# Patient Record
Sex: Male | Born: 1945 | Race: White | Hispanic: No | Marital: Married
Health system: Midwestern US, Academic
[De-identification: ages and names within clinical notes are randomized; demographics above are authoritative.]

---

## 2016-10-09 MED ORDER — TRIAMTERENE-HYDROCHLOROTHIAZID 37.5-25 MG PO TAB
1 | ORAL_TABLET | Freq: Every morning | ORAL | 0 refills | Status: DC
Start: 2016-10-09 — End: 2017-01-04

## 2016-10-19 MED ORDER — HYDRALAZINE 25 MG PO TAB
25 mg | ORAL_TABLET | Freq: Three times a day (TID) | ORAL | 11 refills | 30.00000 days | Status: DC
Start: 2016-10-19 — End: 2016-10-23

## 2016-10-23 MED ORDER — ROSUVASTATIN 40 MG PO TAB
ORAL_TABLET | Freq: Every day | ORAL | 3 refills | 90.00000 days | Status: DC
Start: 2016-10-23 — End: 2017-11-07

## 2016-10-23 MED ORDER — HYDRALAZINE 25 MG PO TAB
25 mg | ORAL_TABLET | Freq: Three times a day (TID) | ORAL | 11 refills | 30.00000 days | Status: DC
Start: 2016-10-23 — End: 2016-10-30

## 2016-10-23 MED ORDER — CARVEDILOL 12.5 MG PO TAB
12.5 mg | ORAL_TABLET | Freq: Two times a day (BID) | ORAL | 3 refills | 90.00000 days | Status: DC
Start: 2016-10-23 — End: 2016-10-30

## 2016-10-30 MED ORDER — CARVEDILOL 12.5 MG PO TAB
12.5 mg | ORAL_TABLET | Freq: Two times a day (BID) | ORAL | 1 refills | 90.00000 days | Status: DC
Start: 2016-10-30 — End: 2017-02-08

## 2016-10-30 MED ORDER — HYDRALAZINE 25 MG PO TAB
25 mg | ORAL_TABLET | Freq: Three times a day (TID) | ORAL | 1 refills | 30.00000 days | Status: DC
Start: 2016-10-30 — End: 2017-02-08

## 2017-01-04 ENCOUNTER — Encounter: Admit: 2017-01-04 | Discharge: 2017-01-04 | Payer: MEDICARE

## 2017-01-04 MED ORDER — TRIAMTERENE-HYDROCHLOROTHIAZID 37.5-25 MG PO TAB
ORAL_TABLET | Freq: Every morning | ORAL | 3 refills | Status: AC
Start: 2017-01-04 — End: 2017-11-15

## 2017-01-29 ENCOUNTER — Encounter: Admit: 2017-01-29 | Discharge: 2017-01-29 | Payer: MEDICARE

## 2017-01-29 MED ORDER — LOSARTAN 100 MG PO TAB
100 mg | ORAL_TABLET | Freq: Every day | ORAL | 2 refills | 30.00000 days | Status: AC
Start: 2017-01-29 — End: 2017-11-07

## 2017-01-29 MED ORDER — AMLODIPINE 5 MG PO TAB
5 mg | ORAL_TABLET | Freq: Two times a day (BID) | ORAL | 2 refills | Status: AC
Start: 2017-01-29 — End: 2017-11-07

## 2017-02-08 ENCOUNTER — Encounter: Admit: 2017-02-08 | Discharge: 2017-02-08 | Payer: MEDICARE

## 2017-02-08 ENCOUNTER — Ambulatory Visit: Admit: 2017-02-08 | Discharge: 2017-02-09 | Payer: MEDICARE

## 2017-02-08 DIAGNOSIS — E039 Hypothyroidism, unspecified: ICD-10-CM

## 2017-02-08 DIAGNOSIS — I10 Essential (primary) hypertension: ICD-10-CM

## 2017-02-08 DIAGNOSIS — R001 Bradycardia, unspecified: ICD-10-CM

## 2017-02-08 DIAGNOSIS — E7800 Pure hypercholesterolemia, unspecified: ICD-10-CM

## 2017-02-08 DIAGNOSIS — Z0189 Encounter for other specified special examinations: ICD-10-CM

## 2017-02-08 DIAGNOSIS — I2510 Atherosclerotic heart disease of native coronary artery without angina pectoris: ICD-10-CM

## 2017-02-08 DIAGNOSIS — R079 Chest pain, unspecified: ICD-10-CM

## 2017-02-08 DIAGNOSIS — R9439 Abnormal result of other cardiovascular function study: ICD-10-CM

## 2017-02-08 MED ORDER — HYDRALAZINE 50 MG PO TAB
50 mg | ORAL_TABLET | Freq: Three times a day (TID) | ORAL | 3 refills | 30.00000 days | Status: AC
Start: 2017-02-08 — End: 2018-04-30

## 2017-02-08 MED ORDER — HYDRALAZINE 50 MG PO TAB
50 mg | ORAL_TABLET | Freq: Three times a day (TID) | ORAL | 11 refills | 30.00000 days | Status: AC
Start: 2017-02-08 — End: 2017-02-08

## 2017-02-08 MED ORDER — CARVEDILOL 12.5 MG PO TAB
6.25 mg | ORAL_TABLET | Freq: Two times a day (BID) | ORAL | 1 refills | 90.00000 days | Status: AC
Start: 2017-02-08 — End: 2017-11-07

## 2017-02-08 NOTE — Progress Notes
Date of Service: 02/08/2017    OSWALD POTT is a 71 y.o. male.       HPI     Mr. Venus is followed for coronary artery diseases, peripheral arterial disease, hyperlipidemia, and hypertension. When he was last seen in clinic he was placed on hydraloine 25 mg three times daily. He has been taking this twice daily because he forgets to take the afternoon dose. He has experienced no adverse effects to hydralazine.  The patient has been tracking his blood pressure recently and his blood pressure has been running approximately 130-140/70-80 mm Hg. His heart rate has been running 45-50 BPM.   Mr. Schafer reports no angina in recent months.??? He reports no congestive symptoms, palpitations, sensation of sustained forceful heart pounding, lightheadedness or syncope. His exercise tolerance has been stable. 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 The patient reports no myalgias, bleeding abnormalities, neurologic motor abnormalities or difficulty with speech. He denies any claudication. He has been tolerating the 40 mg dose of rosuvastatin without adverse effects.  He has lost 8 pounds since his last clinic visit.  ???  Historically, Mr. Harn underwent coronary artery bypass surgery in December 2005. At that time his revascularization consisted of an IMA graft to the LAD with a right internal mammary going to the obtuse marginal and a radial to the posterior descending artery. I saw Mr. Haithcock on 09/23/10 after an abnormal stress test was recently obtained. On 09/23/10 he told me that he had infrequent mild angina which he had noted over the past year. It occurred less than once a month and only when he was walking uphill and only when he was walking shortly after a meal. On 09/17/10 he walked 6 blocks in 10 minutes during a parade and had no angina because he had not eaten recently and was not walking uphill. I reviewed his stress test with him and he wanted to proceed with coronary angiography which was performed on 09/26/10. Intervention was not required. On 09/30/10 carvedilol was added to his medical regimen for BP control and he has tolerated this medication without difficulty. On 10/21/10 HCTZ 25 mg with triamterene 37.5 mg daily was also added to his medical regimen for improved BP control. ???The patient says that he had cellulitis in his left lower extremity in September 2015 that resolved with antibiotic therapy.       Vitals:    02/08/17 0843 02/08/17 0856   BP: 138/72 134/72   Pulse: (!) 39    SpO2:  95%   Weight: 98 kg (216 lb)    Height: 1.803 m (5' 11)      Body mass index is 30.13 kg/m???.     Past Medical History  Patient Active Problem List    Diagnosis Date Noted   ??? Chest pain 09/26/2010   ??? Abnormal stress test 09/26/2010   ??? Murmur 09/23/2010   ??? Coronary artery disease      A. Onset mid 2004, progression fall 2005, patient reduces activity at work to avoid symptoms  B. 12/05 Progressive angina, Dr. Duanne Limerick, then 06/29/04 CBP [facility] OV, referred for cath          >12/29 Cath 100%RCA, 75%LAD&Cx,50%LM EF 55%          >07/01/05 CABGx3 LIMA-LAD w/ Y-RIMA-OM, Rad-PDA per Dr. Ky Barban  C. 06/29/08 exercise echocardiographic images demonstrated basal septal ischemia, significant hypertensive response to  Exercise, good and adequate exercise tolerance with no angina.     ??? Hypercholesterolemia      A.  02/28/01 total 324 total 249 HDL 43 LDL 231 Pt deferred meds  B. 06/29/04 total 273 trig 193 HDL 36 LDLd206 Zocor begun dc'd by pt d/t statinphobia and cost  C.  2/24 Rx Vytorin 10/80 1/2 tab.     ??? Hypothyroidism      A.  2004 presentation with TSH over 200, Rx w l-thyroxine     ??? Hypertension      A.  2/06 Altace 5 dc'd w/ dysgeusia, Metoprolol 25 BID begun        ??? Tests ordered      A. 07/17/08 carotid artery duplex scan: no significant stenosis           Review of Systems Constitution: Positive for weight loss.   HENT: Negative.    Eyes: Negative.    Cardiovascular: Negative.    Respiratory: Negative.    Endocrine: Negative.    Hematologic/Lymphatic: Negative.    Skin: Negative.    Musculoskeletal: Positive for back pain.   Gastrointestinal: Negative.    Genitourinary: Negative.    Neurological: Positive for dizziness.   Psychiatric/Behavioral: Negative.    Allergic/Immunologic: Negative.        Physical Exam  GENERAL: The patient is well developed, well nourished, resting comfortably and in no distress. ???  HEENT: No abnormalities of the visible oro-nasopharynx, conjunctiva or sclera are noted. ???  NECK: There is no jugular venous distension. Carotids are palpable and without bruits. There is no thyroid enlargement. ???  Chest: Lung fields are clear to auscultation. There are no wheezes or crackles. ???  CV: There is a regular rhythm. The first and second heart sounds are normal. A grade 3/6 systolic murmur is heard, loudest in the right upper sternal area. There are no murmurs, gallops or rubs.  I checked his apical heart rate for 30 seconds and was 46 bpm.  ABD: The abdomen is soft and supple with normal bowel sounds. There is no hepatosplenomegaly, ascites, tenderness, masses or bruits. ???  Neuro: There are no focal motor defects. Ambulation is normal. Cognitive function appears normal. ???  Ext:???There is trace to 1+ bilateral pedal edema without evidence of deep vein thrombosis. Peripheral pulses are decreased but he has normal capillary refill and no ischemic ulcers. ???  SKIN:???There are no rashes and no cellulitis  PSYCH:???The patient is calm, rationale and oriented.    Cardiovascular Studies  Twelve-lead ECG obtained on 10/19/2016 shows sinus bradycardia with a heart rate of 50 bpm.  A nonspecific interventricular conduction delay is noted with a QRS duration of 114 ms.  Nonspecific ST-T wave abnormalities are seen.  ???  Echo Doppler 03/18/2015: 1. Septal segments have reduced contractility compared with other segments, which are dynamic. This may be attributable to prior cardiac surgery. Overall LV systolic function appears dynamic with an estimated left ventricular ejection fraction in the range of 70%.  2. There is moderate concentric left ventricular hypertrophy.  3. Normal left ventricular diastolic function.   4. Mild right ventricular enlargement with normal right ventricular contractility.  5. Mild bi-atrial enlargement.  6. Moderate aortic valve stenosis.  No pericardial effusion is seen.  ???  Labs from October 02, 2016 reveals total cholesterol 149, triglycerides 161, HDL 35 and LDL 95 mg/dL.  His ALT equals 32.  His serum creatinine = 0.81 and serum potassium is 4.1 mmol/L.    Problems Addressed Today  Coronary artery disease.  Hypertension.  Aortic valve stenosis.  Assessment and Plan     Mr. Lacewell's blood pressure appears better controlled recently.  I reviewed options for the medical management of hypertension with the patient.  He wanted to increase his hydralazine to 50 mg 3 times daily and lower his carvedilol to 6.25 mg twice a day because of his asymptomatic sinus bradycardia. I have asked the patient to keep a log book of his BP readings and to report systolic BP readings exceeding 130 mm Hg.  I have asked him to obtain an echo Doppler study to follow her progression of his aortic valve stenosis.  However, the present time he is feeling well and is asymptomatic.  I have asked him to return for follow-up in 2 months for a nursing check of his blood pressure and pulse and I  have asked him to return for a full clinic examination in 4 months time.         Current Medications (including today's revisions)  ??? amLODIPine (NORVASC) 5 mg tablet Take 1 tablet by mouth twice daily.   ??? aspirin EC 81 mg tablet Take 81 mg by mouth daily.   ??? carvedilol (COREG) 12.5 mg tablet Take 0.5 tablets by mouth twice daily. Take with food. ??? coenzyme Q10(+) 100 mg cap Take 100 mg by mouth daily.   ??? CRANBERRY FRUIT EXTRACT (CRANBERRY PO) Take 4,200 mg by mouth daily.   ??? GLUCOSAMINE SULFATE (GLUCOSAMINE PO) Take 1,000 mg by mouth daily.   ??? hydrALAZINE (APRESOLINE) 50 mg tablet Take 1 tablet by mouth three times daily.   ??? L.ACID/L.CASEI/B.BIF/B.LON/FOS (PROBIOTIC BLEND PO) Take 1 tablet by mouth daily.   ??? levothyroxine (SYNTHROID) 175 mcg tablet Take 175 mcg by mouth daily 30 minutes before breakfast.   ??? losartan(+) (COZAAR) 100 mg tablet Take 1 tablet by mouth daily.   ??? metFORMIN (GLUCOPHAGE) 500 mg tablet Take 500 mg by mouth twice daily with meals.   ??? multivitamin (MULTIPLE VITAMIN) PO per tablet Take 1 Tab by mouth Daily.   ??? nitroglycerin (NITROSTAT) 0.4 mg tablet 1 tab under your tongue as needed for chest pain may repeat in x 2   ??? omeprazole DR(+) (PRILOSEC) 20 mg PO capsule Take 10 mg by mouth. Take 1/2 tablet daily    ??? rosuvastatin (CRESTOR) 40 mg tablet TAKE 1 TABLET EVERY DAY   ??? triamterene-hydrochlorothiazide (MAXZIDE) 37.5-25 mg tablet TAKE 1 TABLET EVERY MORNING

## 2017-04-12 ENCOUNTER — Encounter: Admit: 2017-04-12 | Discharge: 2017-04-12 | Payer: MEDICARE

## 2017-04-12 NOTE — Progress Notes
Patient is here for bp check after adjustment in medications.  He denies issues.  States bp is normal for the most part but still reports some outliers.  Reported 151/100 today before coming in.  In clinic 122/68.  He states he had just come in after working on building a carport and was in a hurry.  Reviewed importance of sitting a resting 5-23min prior to getting a reading.  Reviewed diet and exercise in relation to bp.  Encouraged continued monitoring and advised to contact if trending up or symptomatic with readings.

## 2017-04-20 ENCOUNTER — Encounter: Admit: 2017-04-20 | Discharge: 2017-04-20 | Payer: MEDICARE

## 2017-05-03 ENCOUNTER — Ambulatory Visit: Admit: 2017-05-03 | Discharge: 2017-05-04 | Payer: MEDICARE

## 2017-05-03 ENCOUNTER — Encounter: Admit: 2017-05-03 | Discharge: 2017-05-03 | Payer: MEDICARE

## 2017-05-03 DIAGNOSIS — I350 Nonrheumatic aortic (valve) stenosis: Principal | ICD-10-CM

## 2017-11-07 ENCOUNTER — Encounter: Admit: 2017-11-07 | Discharge: 2017-11-07 | Payer: MEDICARE

## 2017-11-07 DIAGNOSIS — E7800 Pure hypercholesterolemia, unspecified: ICD-10-CM

## 2017-11-07 DIAGNOSIS — I159 Secondary hypertension, unspecified: ICD-10-CM

## 2017-11-07 DIAGNOSIS — I2510 Atherosclerotic heart disease of native coronary artery without angina pectoris: Principal | ICD-10-CM

## 2017-11-07 MED ORDER — LOSARTAN 100 MG PO TAB
ORAL_TABLET | Freq: Every day | ORAL | 2 refills | 90.00000 days | Status: AC
Start: 2017-11-07 — End: 2018-06-22

## 2017-11-07 MED ORDER — ROSUVASTATIN 40 MG PO TAB
ORAL_TABLET | Freq: Every day | ORAL | 3 refills | 90.00000 days | Status: AC
Start: 2017-11-07 — End: ?

## 2017-11-07 MED ORDER — CARVEDILOL 12.5 MG PO TAB
ORAL_TABLET | Freq: Two times a day (BID) | ORAL | 3 refills | 90.00000 days | Status: AC
Start: 2017-11-07 — End: 2020-02-05

## 2017-11-07 MED ORDER — AMLODIPINE 5 MG PO TAB
ORAL_TABLET | Freq: Two times a day (BID) | 2 refills | Status: AC
Start: 2017-11-07 — End: 2018-10-01

## 2017-11-15 ENCOUNTER — Encounter: Admit: 2017-11-15 | Discharge: 2017-11-15 | Payer: MEDICARE

## 2017-11-15 MED ORDER — TRIAMTERENE-HYDROCHLOROTHIAZID 37.5-25 MG PO TAB
ORAL_TABLET | Freq: Every morning | ORAL | 3 refills | Status: AC
Start: 2017-11-15 — End: 2018-08-15

## 2018-02-07 ENCOUNTER — Encounter: Admit: 2018-02-07 | Discharge: 2018-02-07 | Payer: MEDICARE

## 2018-02-07 ENCOUNTER — Ambulatory Visit: Admit: 2018-02-07 | Discharge: 2018-02-08 | Payer: MEDICARE

## 2018-02-07 DIAGNOSIS — E7800 Pure hypercholesterolemia, unspecified: Principal | ICD-10-CM

## 2018-02-07 DIAGNOSIS — I2510 Atherosclerotic heart disease of native coronary artery without angina pectoris: Principal | ICD-10-CM

## 2018-02-07 DIAGNOSIS — I10 Essential (primary) hypertension: ICD-10-CM

## 2018-02-07 DIAGNOSIS — R079 Chest pain, unspecified: ICD-10-CM

## 2018-02-07 DIAGNOSIS — E039 Hypothyroidism, unspecified: ICD-10-CM

## 2018-02-07 DIAGNOSIS — R9439 Abnormal result of other cardiovascular function study: ICD-10-CM

## 2018-02-07 DIAGNOSIS — Z0189 Encounter for other specified special examinations: ICD-10-CM

## 2018-02-07 MED ORDER — NITROGLYCERIN 0.4 MG SL SUBL
ORAL_TABLET | SUBLINGUAL | 3 refills | 9.00000 days | Status: AC | PRN
Start: 2018-02-07 — End: ?

## 2018-04-04 LAB — COMPREHENSIVE METABOLIC PANEL
Lab: 0.6
Lab: 142
Lab: 15 — ABNORMAL HIGH (ref 0–14)
Lab: 21
Lab: 27
Lab: 3.9
Lab: 75
Lab: 86

## 2018-04-04 LAB — HEMOGLOBIN A1C: Lab: 6.2

## 2018-04-04 LAB — THYROID STIMULATING HORMONE-TSH: Lab: 1.2 — ABNORMAL HIGH (ref 27.0–31.0)

## 2018-04-04 LAB — PROSTATIC SPECIFIC ANTIGEN-PSA: Lab: 0.2 — ABNORMAL HIGH (ref 30–200)

## 2018-04-04 LAB — CBC: Lab: 7.9

## 2018-04-04 LAB — LIPID PROFILE
Lab: 163
Lab: 4
Lab: 50 — ABNORMAL HIGH (ref 5–40)
Lab: 90

## 2018-04-30 ENCOUNTER — Encounter: Admit: 2018-04-30 | Discharge: 2018-04-30 | Payer: MEDICARE

## 2018-04-30 MED ORDER — HYDRALAZINE 50 MG PO TAB
50 mg | ORAL_TABLET | Freq: Three times a day (TID) | ORAL | 1 refills | 30.00000 days | Status: AC
Start: 2018-04-30 — End: 2018-08-30

## 2018-04-30 MED ORDER — HYDRALAZINE 50 MG PO TAB
ORAL_TABLET | Freq: Three times a day (TID) | ORAL | 3 refills | 42.50000 days | Status: AC
Start: 2018-04-30 — End: 2018-04-30

## 2018-05-02 ENCOUNTER — Encounter: Admit: 2018-05-02 | Discharge: 2018-05-02 | Payer: MEDICARE

## 2018-05-16 ENCOUNTER — Encounter: Admit: 2018-05-16 | Discharge: 2018-05-16 | Payer: MEDICARE

## 2018-05-23 ENCOUNTER — Encounter: Admit: 2018-05-23 | Discharge: 2018-05-23 | Payer: MEDICARE

## 2018-06-05 ENCOUNTER — Encounter: Admit: 2018-06-05 | Discharge: 2018-06-05 | Payer: MEDICARE

## 2018-06-05 LAB — COMPREHENSIVE METABOLIC PANEL
Lab: 0.6
Lab: 0.8 — ABNORMAL LOW (ref 33.0–37.0)
Lab: 110
Lab: 145
Lab: 19
Lab: 26
Lab: 6.9
Lab: 9.7

## 2018-06-05 LAB — THYROID STIMULATING HORMONE-TSH: Lab: 1.9 — ABNORMAL HIGH (ref 98–107)

## 2018-06-05 LAB — CBC: Lab: 10

## 2018-06-05 LAB — BNP (B-TYPE NATRIURETIC PEPTI): Lab: 410 — ABNORMAL HIGH (ref 0–100)

## 2018-06-06 ENCOUNTER — Encounter: Admit: 2018-06-06 | Discharge: 2018-06-06 | Payer: MEDICARE

## 2018-06-06 ENCOUNTER — Ambulatory Visit: Admit: 2018-06-06 | Discharge: 2018-06-07 | Payer: MEDICARE

## 2018-06-06 DIAGNOSIS — I10 Essential (primary) hypertension: ICD-10-CM

## 2018-06-06 DIAGNOSIS — R079 Chest pain, unspecified: ICD-10-CM

## 2018-06-06 DIAGNOSIS — Z0189 Encounter for other specified special examinations: ICD-10-CM

## 2018-06-06 DIAGNOSIS — I2510 Atherosclerotic heart disease of native coronary artery without angina pectoris: Principal | ICD-10-CM

## 2018-06-06 DIAGNOSIS — E039 Hypothyroidism, unspecified: ICD-10-CM

## 2018-06-06 DIAGNOSIS — R9439 Abnormal result of other cardiovascular function study: ICD-10-CM

## 2018-06-06 DIAGNOSIS — I350 Nonrheumatic aortic (valve) stenosis: ICD-10-CM

## 2018-06-06 DIAGNOSIS — E7800 Pure hypercholesterolemia, unspecified: ICD-10-CM

## 2018-06-06 DIAGNOSIS — R0603 Acute respiratory distress: Principal | ICD-10-CM

## 2018-06-06 MED ORDER — FUROSEMIDE 40 MG PO TAB
40 mg | ORAL_TABLET | Freq: Two times a day (BID) | ORAL | 0 refills | 90.00000 days | Status: AC
Start: 2018-06-06 — End: 2018-06-20

## 2018-06-07 ENCOUNTER — Ambulatory Visit: Admit: 2018-06-07 | Discharge: 2018-06-07 | Payer: MEDICARE

## 2018-06-07 DIAGNOSIS — R0603 Acute respiratory distress: Principal | ICD-10-CM

## 2018-06-10 ENCOUNTER — Encounter: Admit: 2018-06-10 | Discharge: 2018-06-10 | Payer: MEDICARE

## 2018-06-10 DIAGNOSIS — E7800 Pure hypercholesterolemia, unspecified: ICD-10-CM

## 2018-06-10 DIAGNOSIS — R0600 Dyspnea, unspecified: Principal | ICD-10-CM

## 2018-06-10 DIAGNOSIS — I25798 Atherosclerosis of other coronary artery bypass graft(s) with other forms of angina pectoris: Principal | ICD-10-CM

## 2018-06-10 DIAGNOSIS — I25799 Atherosclerosis of other coronary artery bypass graft(s) with unspecified angina pectoris: ICD-10-CM

## 2018-06-10 MED ORDER — NITROGLYCERIN 0.3 MG SL SUBL
.4 mg | SUBLINGUAL | 0 refills | Status: CN | PRN
Start: 2018-06-10 — End: ?

## 2018-06-10 MED ORDER — ALUMINUM-MAGNESIUM HYDROXIDE 200-200 MG/5 ML PO SUSP
30 mL | ORAL | 0 refills | Status: CN | PRN
Start: 2018-06-10 — End: ?

## 2018-06-10 MED ORDER — TEMAZEPAM 7.5 MG PO CAP
15 mg | Freq: Every evening | ORAL | 0 refills | Status: CN | PRN
Start: 2018-06-10 — End: ?

## 2018-06-10 MED ORDER — ACETAMINOPHEN 325 MG PO TAB
650 mg | ORAL | 0 refills | Status: CN | PRN
Start: 2018-06-10 — End: ?

## 2018-06-10 MED ORDER — DOCUSATE SODIUM 100 MG PO CAP
100 mg | Freq: Every day | ORAL | 0 refills | Status: CN | PRN
Start: 2018-06-10 — End: ?

## 2018-06-11 ENCOUNTER — Encounter: Admit: 2018-06-11 | Discharge: 2018-06-11 | Payer: MEDICARE

## 2018-06-13 ENCOUNTER — Encounter: Admit: 2018-06-13 | Discharge: 2018-06-13 | Payer: MEDICARE

## 2018-06-13 DIAGNOSIS — I2510 Atherosclerotic heart disease of native coronary artery without angina pectoris: ICD-10-CM

## 2018-06-13 DIAGNOSIS — R072 Precordial pain: ICD-10-CM

## 2018-06-13 DIAGNOSIS — I350 Nonrheumatic aortic (valve) stenosis: Principal | ICD-10-CM

## 2018-06-13 MED ORDER — ASPIRIN 325 MG PO TAB
325 mg | Freq: Once | ORAL | 0 refills | Status: CN
Start: 2018-06-13 — End: ?

## 2018-06-18 ENCOUNTER — Encounter: Admit: 2018-06-18 | Discharge: 2018-06-18 | Payer: MEDICARE

## 2018-06-18 ENCOUNTER — Ambulatory Visit: Admit: 2018-06-18 | Discharge: 2018-06-18 | Payer: MEDICARE

## 2018-06-18 DIAGNOSIS — R0609 Other forms of dyspnea: ICD-10-CM

## 2018-06-18 DIAGNOSIS — R9439 Abnormal result of other cardiovascular function study: ICD-10-CM

## 2018-06-18 DIAGNOSIS — I10 Essential (primary) hypertension: ICD-10-CM

## 2018-06-18 DIAGNOSIS — J449 Chronic obstructive pulmonary disease, unspecified: ICD-10-CM

## 2018-06-18 DIAGNOSIS — I350 Nonrheumatic aortic (valve) stenosis: Secondary | ICD-10-CM

## 2018-06-18 DIAGNOSIS — R079 Chest pain, unspecified: ICD-10-CM

## 2018-06-18 DIAGNOSIS — I2510 Atherosclerotic heart disease of native coronary artery without angina pectoris: Principal | ICD-10-CM

## 2018-06-18 DIAGNOSIS — E7800 Pure hypercholesterolemia, unspecified: ICD-10-CM

## 2018-06-18 DIAGNOSIS — E039 Hypothyroidism, unspecified: ICD-10-CM

## 2018-06-18 DIAGNOSIS — Z0189 Encounter for other specified special examinations: ICD-10-CM

## 2018-06-18 DIAGNOSIS — I2583 Coronary atherosclerosis due to lipid rich plaque: ICD-10-CM

## 2018-06-18 DIAGNOSIS — I25118 Atherosclerotic heart disease of native coronary artery with other forms of angina pectoris: ICD-10-CM

## 2018-06-18 LAB — O2HGB SAT-VENOUS POC
Lab: 75 % — ABNORMAL HIGH (ref 55–71)
Lab: 76 % — ABNORMAL HIGH (ref 55–71)
Lab: 96 % — ABNORMAL HIGH (ref 55–71)

## 2018-06-18 LAB — BASIC METABOLIC PANEL
Lab: 0.7 mg/dL (ref 0.4–1.24)
Lab: 131 mg/dL — ABNORMAL HIGH (ref 70–100)
Lab: 137 MMOL/L (ref 137–147)
Lab: 15 mg/dL (ref 7–25)
Lab: 3.6 MMOL/L (ref 3.5–5.1)
Lab: 30 MMOL/L (ref 21–30)
Lab: 8 (ref 3–12)
Lab: 9.1 mg/dL (ref 8.5–10.6)
Lab: >60 mL/min (ref >60)
Lab: >60 mL/min (ref >60)

## 2018-06-18 LAB — POC GLUCOSE
Lab: 124 mg/dL — ABNORMAL HIGH (ref 70–100)
Lab: 122 mg/dL — ABNORMAL HIGH (ref 70–100)

## 2018-06-18 LAB — MAGNESIUM: Lab: 1.8 mg/dL (ref 1.6–2.6)

## 2018-06-18 MED ORDER — POTASSIUM CHLORIDE 10 MEQ PO TBER
20 meq | ORAL_TABLET | Freq: Two times a day (BID) | ORAL | 3 refills | 30.00000 days | Status: AC
Start: 2018-06-18 — End: 2018-06-20

## 2018-06-18 MED ORDER — SODIUM CHLORIDE 0.9 % IV SOLP
250 mL | Freq: Once | INTRAVENOUS | 0 refills | Status: CP
Start: 2018-06-18 — End: ?
  Administered 2018-06-18: 14:00:00 250 mL via INTRAVENOUS

## 2018-06-18 MED ORDER — ACETAMINOPHEN 325 MG PO TAB
650 mg | ORAL | 0 refills | Status: DC | PRN
Start: 2018-06-18 — End: 2018-06-18

## 2018-06-18 MED ORDER — ALUMINUM-MAGNESIUM HYDROXIDE 200-200 MG/5 ML PO SUSP
30 mL | ORAL | 0 refills | Status: DC | PRN
Start: 2018-06-18 — End: 2018-06-18

## 2018-06-18 MED ORDER — ONDANSETRON HCL (PF) 4 MG/2 ML IJ SOLN
4 mg | INTRAVENOUS | 0 refills | Status: DC | PRN
Start: 2018-06-18 — End: 2018-06-18

## 2018-06-18 MED ORDER — METFORMIN 500 MG PO TAB
500 mg | ORAL_TABLET | Freq: Two times a day (BID) | ORAL | 0 refills | Status: AC
Start: 2018-06-18 — End: ?

## 2018-06-18 MED ORDER — ASPIRIN 325 MG PO TAB
325 mg | Freq: Once | ORAL | 0 refills | Status: DC
Start: 2018-06-18 — End: 2018-06-18

## 2018-06-18 MED ORDER — DIPHENHYDRAMINE HCL 25 MG PO CAP
25 mg | ORAL | 0 refills | Status: DC | PRN
Start: 2018-06-18 — End: 2018-06-18

## 2018-06-18 MED ORDER — NITROGLYCERIN 0.4 MG SL SUBL
.4 mg | SUBLINGUAL | 0 refills | Status: DC | PRN
Start: 2018-06-18 — End: 2018-06-18

## 2018-06-18 MED ORDER — DIPHENHYDRAMINE HCL 50 MG/ML IJ SOLN
25 mg | INTRAVENOUS | 0 refills | Status: DC | PRN
Start: 2018-06-18 — End: 2018-06-18

## 2018-06-18 MED ORDER — DOCUSATE SODIUM 100 MG PO CAP
100 mg | Freq: Every day | ORAL | 0 refills | Status: DC | PRN
Start: 2018-06-18 — End: 2018-06-18

## 2018-06-18 MED ORDER — MAGNESIUM SULFATE IN D5W 1 GRAM/100 ML IV PGBK
1 g | Freq: Once | INTRAVENOUS | 0 refills | Status: CP
Start: 2018-06-18 — End: ?
  Administered 2018-06-18: 19:00:00 1 g via INTRAVENOUS

## 2018-06-18 MED ORDER — TEMAZEPAM 15 MG PO CAP
15 mg | Freq: Every evening | ORAL | 0 refills | Status: DC | PRN
Start: 2018-06-18 — End: 2018-06-18

## 2018-06-18 MED ORDER — SODIUM CHLORIDE 0.9 % IV SOLP
1000 mL | Freq: Once | INTRAVENOUS | 0 refills | Status: DC
Start: 2018-06-18 — End: 2018-06-18

## 2018-06-18 MED ORDER — POTASSIUM CHLORIDE 20 MEQ PO TBTQ
40 meq | Freq: Once | ORAL | 0 refills | Status: CP
Start: 2018-06-18 — End: ?
  Administered 2018-06-18: 19:00:00 40 meq via ORAL

## 2018-06-20 ENCOUNTER — Encounter: Admit: 2018-06-20 | Discharge: 2018-06-20 | Payer: MEDICARE

## 2018-06-20 DIAGNOSIS — I350 Nonrheumatic aortic (valve) stenosis: Principal | ICD-10-CM

## 2018-06-20 DIAGNOSIS — I2510 Atherosclerotic heart disease of native coronary artery without angina pectoris: ICD-10-CM

## 2018-06-20 MED ORDER — FUROSEMIDE 40 MG PO TAB
40 mg | ORAL_TABLET | Freq: Two times a day (BID) | ORAL | 3 refills | 90.00000 days | Status: AC
Start: 2018-06-20 — End: 2018-07-02

## 2018-06-20 MED ORDER — POTASSIUM CHLORIDE 10 MEQ PO TBER
20 meq | ORAL_TABLET | Freq: Two times a day (BID) | ORAL | 3 refills | 30.00000 days | Status: AC
Start: 2018-06-20 — End: 2018-07-02

## 2018-06-21 ENCOUNTER — Encounter: Admit: 2018-06-21 | Discharge: 2018-06-21 | Payer: MEDICARE

## 2018-06-22 MED ORDER — LOSARTAN 100 MG PO TAB
ORAL_TABLET | Freq: Every day | ORAL | 3 refills | 30.00000 days | Status: AC
Start: 2018-06-22 — End: 2019-05-14

## 2018-06-25 LAB — BASIC METABOLIC PANEL
Lab: 0.8
Lab: 102
Lab: 12
Lab: 121 — ABNORMAL HIGH (ref 70–105)
Lab: 141
Lab: 19
Lab: 31
Lab: 4.3
Lab: 9.7
Lab: 94

## 2018-06-25 LAB — MAGNESIUM: Lab: 1.8

## 2018-06-28 ENCOUNTER — Encounter: Admit: 2018-06-28 | Discharge: 2018-06-28 | Payer: MEDICARE

## 2018-06-28 DIAGNOSIS — I350 Nonrheumatic aortic (valve) stenosis: Principal | ICD-10-CM

## 2018-06-28 DIAGNOSIS — I2510 Atherosclerotic heart disease of native coronary artery without angina pectoris: ICD-10-CM

## 2018-07-02 ENCOUNTER — Encounter: Admit: 2018-07-02 | Discharge: 2018-07-02 | Payer: MEDICARE

## 2018-07-02 ENCOUNTER — Ambulatory Visit: Admit: 2018-07-02 | Discharge: 2018-07-02 | Payer: MEDICARE

## 2018-07-02 DIAGNOSIS — Z0189 Encounter for other specified special examinations: ICD-10-CM

## 2018-07-02 DIAGNOSIS — E7800 Pure hypercholesterolemia, unspecified: ICD-10-CM

## 2018-07-02 DIAGNOSIS — R079 Chest pain, unspecified: ICD-10-CM

## 2018-07-02 DIAGNOSIS — I2510 Atherosclerotic heart disease of native coronary artery without angina pectoris: ICD-10-CM

## 2018-07-02 DIAGNOSIS — I10 Essential (primary) hypertension: ICD-10-CM

## 2018-07-02 DIAGNOSIS — R9439 Abnormal result of other cardiovascular function study: ICD-10-CM

## 2018-07-02 DIAGNOSIS — J449 Chronic obstructive pulmonary disease, unspecified: ICD-10-CM

## 2018-07-02 DIAGNOSIS — E039 Hypothyroidism, unspecified: ICD-10-CM

## 2018-07-02 DIAGNOSIS — I350 Nonrheumatic aortic (valve) stenosis: Principal | ICD-10-CM

## 2018-07-02 MED ORDER — POTASSIUM CHLORIDE 10 MEQ PO TBER
20 meq | ORAL_TABLET | Freq: Every day | ORAL | 3 refills | 30.00000 days | Status: AC
Start: 2018-07-02 — End: 2018-08-15

## 2018-07-02 MED ORDER — FUROSEMIDE 40 MG PO TAB
40 mg | ORAL_TABLET | Freq: Every morning | ORAL | 3 refills | 90.00000 days | Status: AC
Start: 2018-07-02 — End: 2018-08-15

## 2018-08-15 ENCOUNTER — Encounter: Admit: 2018-08-15 | Discharge: 2018-08-15 | Payer: MEDICARE

## 2018-08-15 ENCOUNTER — Ambulatory Visit: Admit: 2018-08-15 | Discharge: 2018-08-15 | Payer: MEDICARE

## 2018-08-15 DIAGNOSIS — E7800 Pure hypercholesterolemia, unspecified: ICD-10-CM

## 2018-08-15 DIAGNOSIS — E039 Hypothyroidism, unspecified: ICD-10-CM

## 2018-08-15 DIAGNOSIS — I350 Nonrheumatic aortic (valve) stenosis: Principal | ICD-10-CM

## 2018-08-15 DIAGNOSIS — I2510 Atherosclerotic heart disease of native coronary artery without angina pectoris: Principal | ICD-10-CM

## 2018-08-15 DIAGNOSIS — I10 Essential (primary) hypertension: ICD-10-CM

## 2018-08-15 DIAGNOSIS — J449 Chronic obstructive pulmonary disease, unspecified: ICD-10-CM

## 2018-08-15 DIAGNOSIS — R079 Chest pain, unspecified: ICD-10-CM

## 2018-08-15 DIAGNOSIS — R9439 Abnormal result of other cardiovascular function study: ICD-10-CM

## 2018-08-15 DIAGNOSIS — Z0189 Encounter for other specified special examinations: ICD-10-CM

## 2018-08-15 MED ORDER — POTASSIUM CHLORIDE 10 MEQ PO TBER
20 meq | ORAL_TABLET | ORAL | 3 refills | 30.00000 days | Status: AC
Start: 2018-08-15 — End: 2018-12-05

## 2018-08-15 MED ORDER — FUROSEMIDE 40 MG PO TAB
40 mg | ORAL_TABLET | ORAL | 3 refills | 90.00000 days | Status: AC
Start: 2018-08-15 — End: 2018-12-05

## 2018-08-30 ENCOUNTER — Encounter: Admit: 2018-08-30 | Discharge: 2018-08-30 | Payer: MEDICARE

## 2018-08-30 MED ORDER — HYDRALAZINE 50 MG PO TAB
50 mg | ORAL_TABLET | Freq: Three times a day (TID) | ORAL | 3 refills | 30.00000 days | Status: AC
Start: 2018-08-30 — End: 2018-09-03

## 2018-09-03 ENCOUNTER — Encounter: Admit: 2018-09-03 | Discharge: 2018-09-03 | Payer: MEDICARE

## 2018-09-03 MED ORDER — HYDRALAZINE 50 MG PO TAB
50 mg | ORAL_TABLET | Freq: Three times a day (TID) | ORAL | 3 refills | 30.00000 days | Status: AC
Start: 2018-09-03 — End: 2019-10-02

## 2018-10-01 ENCOUNTER — Encounter: Admit: 2018-10-01 | Discharge: 2018-10-01 | Payer: MEDICARE

## 2018-10-01 DIAGNOSIS — I10 Essential (primary) hypertension: Principal | ICD-10-CM

## 2018-10-01 MED ORDER — AMLODIPINE 5 MG PO TAB
5 mg | ORAL_TABLET | Freq: Two times a day (BID) | ORAL | 3 refills | Status: AC
Start: 2018-10-01 — End: 2019-07-22

## 2018-10-02 MED ORDER — AMLODIPINE 5 MG PO TAB
ORAL_TABLET | Freq: Two times a day (BID) | 2 refills | Status: AC
Start: 2018-10-02 — End: 2019-03-25

## 2018-10-31 ENCOUNTER — Encounter: Admit: 2018-10-31 | Discharge: 2018-10-31 | Payer: MEDICARE

## 2018-10-31 DIAGNOSIS — I2510 Atherosclerotic heart disease of native coronary artery without angina pectoris: Principal | ICD-10-CM

## 2018-10-31 DIAGNOSIS — Z01810 Encounter for preprocedural cardiovascular examination: ICD-10-CM

## 2018-11-12 ENCOUNTER — Encounter: Admit: 2018-11-12 | Discharge: 2018-11-12 | Payer: MEDICARE

## 2018-11-12 ENCOUNTER — Ambulatory Visit: Admit: 2018-11-12 | Discharge: 2018-11-12 | Payer: MEDICARE

## 2018-11-12 DIAGNOSIS — I2510 Atherosclerotic heart disease of native coronary artery without angina pectoris: Principal | ICD-10-CM

## 2018-11-13 ENCOUNTER — Ambulatory Visit: Admit: 2018-11-13 | Discharge: 2018-11-14 | Payer: MEDICARE

## 2018-11-13 ENCOUNTER — Encounter: Admit: 2018-11-13 | Discharge: 2018-11-13 | Payer: MEDICARE

## 2018-11-13 DIAGNOSIS — I2510 Atherosclerotic heart disease of native coronary artery without angina pectoris: Principal | ICD-10-CM

## 2018-11-13 NOTE — Telephone Encounter
-----   Message from Hester Mates, MD sent at 11/13/2018  2:24 PM CDT -----  Please send Brett Canales:[name]  Please call Mr. Zapf with the results of his regadenoson thallium stress test.  No action is required since he recently had coronary angiography.  I have placed an addendum on his clinic note from 08/15/2018 concerning his perioperative risk.  Please send this to his surgical and anesthesia staff.  Thanks.  SBG  ----- Message -----  From: Laurence Aly, MD  Sent: 11/13/2018  12:28 PM CDT  To: Hester Mates, MD

## 2018-11-21 NOTE — Telephone Encounter
Letter faxed to Dr. Mallory Shirk office [phone].

## 2018-11-29 ENCOUNTER — Encounter: Admit: 2018-11-29 | Discharge: 2018-11-29 | Payer: MEDICARE

## 2018-11-29 DIAGNOSIS — I10 Essential (primary) hypertension: Principal | ICD-10-CM

## 2018-11-29 DIAGNOSIS — I2510 Atherosclerotic heart disease of native coronary artery without angina pectoris: ICD-10-CM

## 2018-11-29 NOTE — Telephone Encounter
Patient states recent surgery.  Hx of low O2 sats. He states Dr. Andreas Newport increase diuretics and he is feeling better.  Offered OV today.  Pt states mother's funeral tomorrow.  Scheduled for OV next wk.  Will follow up with labs and call on Monday.

## 2018-12-02 ENCOUNTER — Encounter: Admit: 2018-12-02 | Discharge: 2018-12-02

## 2018-12-03 ENCOUNTER — Encounter: Admit: 2018-12-03 | Discharge: 2018-12-03

## 2018-12-03 NOTE — Telephone Encounter
-----   Message from Lyam Provencio sent at 11/29/2018  9:11 AM CDT -----  Regarding: call and follow up on pt low sats better after increase in diuretics

## 2018-12-04 ENCOUNTER — Encounter: Admit: 2018-12-04 | Discharge: 2018-12-04

## 2018-12-04 NOTE — Telephone Encounter
-----   Message from Weston Brass sent at 11/29/2018  9:11 AM CDT -----  Regarding: call and follow up on pt low sats better after increase in diuretics

## 2018-12-04 NOTE — Telephone Encounter
Pt states doing well after taking additional lasix.  O2 sat is now normal.

## 2018-12-05 ENCOUNTER — Encounter: Admit: 2018-12-05 | Discharge: 2018-12-05

## 2018-12-05 ENCOUNTER — Ambulatory Visit: Admit: 2018-12-05 | Discharge: 2018-12-06

## 2018-12-05 DIAGNOSIS — J449 Chronic obstructive pulmonary disease, unspecified: Secondary | ICD-10-CM

## 2018-12-05 DIAGNOSIS — R9439 Abnormal result of other cardiovascular function study: Secondary | ICD-10-CM

## 2018-12-05 DIAGNOSIS — I10 Essential (primary) hypertension: Secondary | ICD-10-CM

## 2018-12-05 DIAGNOSIS — Z0189 Encounter for other specified special examinations: Secondary | ICD-10-CM

## 2018-12-05 DIAGNOSIS — I350 Nonrheumatic aortic (valve) stenosis: Secondary | ICD-10-CM

## 2018-12-05 DIAGNOSIS — I2510 Atherosclerotic heart disease of native coronary artery without angina pectoris: Secondary | ICD-10-CM

## 2018-12-05 DIAGNOSIS — R079 Chest pain, unspecified: Secondary | ICD-10-CM

## 2018-12-05 DIAGNOSIS — E039 Hypothyroidism, unspecified: Secondary | ICD-10-CM

## 2018-12-05 DIAGNOSIS — I5033 Acute on chronic diastolic (congestive) heart failure: Secondary | ICD-10-CM

## 2018-12-05 DIAGNOSIS — E7800 Pure hypercholesterolemia, unspecified: Secondary | ICD-10-CM

## 2018-12-05 MED ORDER — FUROSEMIDE 40 MG PO TAB
40 mg | ORAL_TABLET | Freq: Every day | ORAL | 3 refills | 90.00000 days | Status: DC
Start: 2018-12-05 — End: 2019-06-02

## 2018-12-05 MED ORDER — POTASSIUM CHLORIDE 10 MEQ PO TBER
20 meq | ORAL_TABLET | Freq: Every day | ORAL | 3 refills | 30.00000 days | Status: DC
Start: 2018-12-05 — End: 2019-05-07

## 2018-12-05 NOTE — Progress Notes
Date of Service: 12/05/2018    Mason Lawson is a 73 y.o. male.       HPI     Mason Lawson is a pleasant 73 year old man with coronary disease status post CABG and heart failure with preserved ejection fraction here as a new patient visit in the advanced heart failure clinic and [place].  He typically follows with my partner Orlin Hilding for his general cardiology needs including coronary disease, peripheral arterial disease, hyperlipidemia, and hypertension.  He has had left shoulder and carpal tunel surgery with and symptoms much improvement in symptoms.   He is here today due to recent episode of hypoxia and decompensated heart failure.  He states everything started 3 or 4 weeks ago with a bunch of family stress, surrounding the death of his 88 year old mother.  He admittedly got stressed to the point of confusion surrounding which meds he was taking and eventually had slow decompensation in his heart failure resulting in ER visit.  He was gradually more short of breath and requiring oxygen with documented pulse ox dropping into the 80s instead of his usual which is in the 90s.  He was eventually seen in the [facility] emergency room on 11/28/2018, and on assessment it was felt his exacerbation was mild, so augmenting p.o. diuretic from every other day to every day was recommended.  He is being worked in today for heart failure visit to follow-up on his recent emergency room visit and for hypoxia.  He notes he tends to get hypoxic with being volume overloaded.  In the emergency room his potassium was also appropriately increased as well.  He states his pulse ox is greatly improved with better diuresis.      He currently wears o2 at night only, and his PCP didn't feel like he needs inhalers, but he did get his PFT done he thinks at PCP office.  He has smoked no tobacco for 15 years. He has been on diuretic since February, and he limits his salt.  Today he reports improvement in dyspnea on exertion and edema.  He currently denies exertional chest pain, irregular heartbeat/palpitations, dizzy spells, syncope/near syncope, orthopnea, and paroxysmal nocturnal dyspnea.  Home blood pressure ranges 110-130/60-70, and pulse remains around 50-60.  Weight on home scale has been down 5 pounds since his ER visit, which is back to normal.            Vitals:    12/05/18 1006 12/05/18 1015   BP: 118/66 114/64   BP Source: Arm, Left Upper Arm, Right Upper   Pulse: 49    SpO2: 96%    Weight: 96.6 kg (213 lb)    Height: 1.803 m (5' 11)    PainSc: Zero      Body mass index is 29.71 kg/m???.     Past Medical History  Patient Active Problem List    Diagnosis Date Noted   ??? Chronic heart failure with preserved ejection fraction (HCC) 12/04/2018     RIGHT HEART CATHETERIZATION:  06/2018 in the setting of severe hypertension  HEMODYNAMICS:   Blood pressure 198/81 with a mean of 130 mmHg. Heart rate 50 beats per minute.   PRESSURES:  1. The right atrial pressure was 4 mmHg. Right ventricular pressure was 64/8 mmHg.  2. The pulmonary artery pressure was 60/70 mmHg with a mean of 32 mmHg. The pulmonary capillary wedge pressure was 17 mmHg. The transpulmonary gradient was 15 mmHg.  3. The diastolic pulmonary gradient was 0 mmHg. The pulmonary  vascular resistance was 2 Wood units.   RIGHT SATURATIONS: The right atrial saturation 77%. The pulmonary artery saturation was 75%. The aortic saturation was 96%.   OUTPUTS: The cardiac output by Fick method was 5.8 L/minute with a cardiac index of 2.7 L/minute per sq m. The cardiac index by thermodilution was 7.3 with a cardiac index of 3.4 L/minute per sq m.     ??? Nonrheumatic aortic valve stenosis 06/06/2018   ??? Chest pain 09/26/2010   ??? Abnormal stress test 09/26/2010   ??? Murmur 09/23/2010   ??? Coronary artery disease      A. Onset mid 2004, progression fall 2005, patient reduces activity at work to avoid symptoms  B. 12/05 Progressive angina, Dr. Duanne Limerick, then 06/29/04 CBP [facility] OV, referred for cath          >12/29 Cath 100%RCA, 75%LAD&Cx,50%LM EF 55%          >07/01/05 CABGx3 LIMA-LAD w/ Y-RIMA-OM, Rad-PDA per Dr. Ky Barban  C. 06/29/08 exercise echocardiographic images demonstrated basal septal ischemia, significant hypertensive response to exercise, good and adequate exercise tolerance with no angina.  D. LHC 06/2018 for new heart failure symptoms: LEFT HEART CATHETERIZATION:  HEMODYNAMICS:  The aortic pressure was- 195/63 with a mean of 105 mmHg .  The left ventricular systolic pressure was 210 mmHg.  There was a peak to peak gradient of 15 mmHg.  1. Left main coronary artery:  Left main coronary artery arises normally from the left coronary sinus.  The left main coronary artery bifurcates into left anterior descending and left circumflex artery and is free of angiographically significant disease.  2. Left anterior descending:  Left anterior descending is a large vessel which gives rise to- 3 diagonal vessels. 1 diagonal has a 80% ostial stenosis, the rest of diagonal vessels do not appear to be angiographically significant. Distal LAD has competitive flow from the LIMA.   3. Left circumflex artery:  The left circumflex artery appears to be medium caliber and has a 100% stenosis proximally. The mid-to-distal portion of the vessel fills from bridging collaterals from proximal portion and is diffusely disease. Three obtuse marginal branches arise, which are also diffusely diseased.   4. Right coronary artery:  The right coronary artery is a medium caliber vessel, but has 100% chronic total occlusion after the takeoff of conus branch. The PDA fills retrogradely from the radial graft to supply the PLV and has mild plaquing in the native PDA.    BYPASS GRAFT ANATOMY:  1.The LIMA to LAD was visualized using IM catheter.  The ostium, body was noted to be free of any angiographic disease, but has a 80% stenosis at the anastamosis with diffusely diseased distal LAD. There is a Y jump graft from the LIMA to the OM branch which fills retrogradely.            2. The radial artery graft to the PDA appears to be without any angiographically significant disease at ostium, body or anastomotic site.   E. Regadenosom MPI 11/2018 TID Ratio:  1.05  (normal <1.36). Summed Stress Score:  3   , Summed Rest Score:  0 Quantitative polar maps show a statistically insignificant amount of inferoapical ischemia. Mild inferobasilar septal hypokinesis.       Left Ventricular Ejection Fraction (post stress, in the resting state) =??? 52 %.  Left Ventricular End Diastolic Volume: 563 mL  ???    SUMMARY/OPINION:?????? This study is borderline abnormal with limited reversible uptake in the inferior wall.  All segments are viable.  Global left ventricular function is within normal limits. In aggregate the current study is low risk in regards to predicted annual cardiovascular mortality rate.  ???     ??? Hypercholesterolemia      A.  02/28/01 total 324 total 249 HDL 43 LDL 231 Pt deferred meds  B. 06/29/04 total 273 trig 193 HDL 36 LDLd206 Zocor begun dc'd by pt d/t statinphobia and cost  C.  2/24 Rx Vytorin 10/80 1/2 tab.     ??? Hypothyroidism      A.  2004 presentation with TSH over 200, Rx w l-thyroxine     ??? Hypertension      A.  2/06 Altace 5 dc'd w/ dysgeusia, Metoprolol 25 BID begun        ??? Tests ordered      A. 07/17/08 carotid artery duplex scan: no significant stenosis           Review of Systems   Constitution: Negative.   HENT: Negative.    Eyes: Negative.    Cardiovascular: Negative.    Respiratory: Negative.    Endocrine: Negative.    Hematologic/Lymphatic: Negative.    Skin: Negative.    Musculoskeletal: Negative.    Gastrointestinal: Negative.    Genitourinary: Negative.    Neurological: Negative.    Psychiatric/Behavioral: Negative.    Allergic/Immunologic: Negative.        Physical Exam  Constitutional: He appears well-developed and well-nourished.   HENT:  Head: Normocephalic. Mouth/Throat: Oropharynx is clear and moist.   Eyes: Conjunctivae are normal.   Neck: Normal range of motion. No JVD present. Normal carotid pulses.  Cardiovascular: Normal rate, regular rhythm, normal heart sounds and intact distal pulses. Exam reveals no gallop and no friction rub.  No murmur heard.  Pulmonary/Chest: Effort normal and breath sounds normal. No respiratory distress. He has no wheezes. He has no rales. He exhibits no chest wall tenderness.   Abdominal: Soft. Bowel sounds are normal. He exhibits no distension. There is no tenderness.   Musculoskeletal: Normal range of motion and muscular tone. He exhibits 1+ edema with no tenderness.   Neurological: He is alert and oriented to person, place, and time. No focal deficits.  Skin: Skin is warm. No erythema.   Psychiatric: He has a normal mood and affect. Judgment, behavior, and thought content normal.       Cardiovascular Studies  Venous duplex [facility] 11/2818, no evidence of DVT in right  lower extremity  Chest x-ray at [facility] 11/28/2018 congestive heart failure with pulmonary vascular congestion, prior sternotomy, cardiomegaly, small pleural effusions.  No acute infiltrate seen.    Problems Addressed Today  Encounter Diagnoses   Name Primary?   ??? Acute on chronic heart failure with preserved ejection fraction (HCC)    ??? Nonrheumatic aortic valve stenosis    ??? Coronary artery disease due to lipid rich plaque        Assessment and Plan     Today he appears warm, euvolemic, with improved NYHA functional class III symptoms and resolved hypoxia.  He had a recent mild acute diastolic heart failure exacerbation with hypoxia that has since resolved with increasing diuretic frequency.  He is no longer volume overloaded or hypoxic.  I will order a repeat echo to reassess his aortic stenosis, diastolic function and heart failure, and we will continue daily Lasix for now with potassium supplementation.  Once he gets to his functional class II baseline we may consider spacing back out  to every other day diuretic depending on how his symptoms and weights progress.  We will track his blood pressure and weights closely and reassess labs next week.  I would like him to return to see Dr. Arna Medici as scheduled in 1 month.    Thank you for allowing me to participate in the care of this patient.  Please do not hesitate to contact me should you have any questions or concerns.    Baldo Ash, MD, PhD  Advanced Heart Failure and Transplant Cardiology  Pager [phone]         I spent 25 minutes with the patient today including approximately 15 minutes in counseling on his heart disease.  We reviewed the above treatment plan, went over risks/benefits/alternatives to the therapies, and all questions were answered to his satisfaction.      Current Medications (including today's revisions)  ??? amLODIPine (NORVASC) 5 mg tablet TAKE 1 TABLET TWICE DAILY   ??? amLODIPine (NORVASC) 5 mg tablet Take one tablet by mouth twice daily.   ??? aspirin EC 81 mg tablet Take 81 mg by mouth daily.   ??? carvedilol (COREG) 12.5 mg tablet TAKE 1 TABLET TWICE DAILY WITH FOOD (Patient taking differently: TAKE 1/2 TABLET TWICE DAILY WITH FOOD)   ??? cholecalciferol (vitamin D3) (OPTIMAL D3) 50,000 units capsule Take 1 capsule by mouth every 7 days.   ??? cinnamon bark (CINNAMON PO) Take 1 tablet by mouth daily.   ??? CRANBERRY FRUIT EXTRACT (CRANBERRY PO) Take 4,200 mg by mouth daily.   ??? furosemide (LASIX) 40 mg tablet Take one tablet by mouth every 48 hours. May take an additional lasix as needed (Patient taking differently: Take 40 mg by mouth daily. May take an additional lasix as needed)   ??? GARLIC PO Take 1 tablet by mouth daily.   ??? glucosamine/chondro su A/C/Mn (GLUCOSAMINE-CHONDROITIN COMPLX PO) Take 1 tablet by mouth daily.   ??? hydrALAZINE (APRESOLINE) 50 mg tablet Take one tablet by mouth three times daily. (Patient taking differently: Take 50 mg by mouth twice daily.)   ??? levothyroxine (SYNTHROID) 175 mcg tablet Take 175 mcg by mouth daily 30 minutes before breakfast.   ??? losartan (COZAAR) 100 mg tablet TAKE 1 TABLET EVERY DAY   ??? metFORMIN (GLUCOPHAGE) 500 mg tablet Take one tablet by mouth twice daily with meals. Ok to resume 06/21/18 (Patient taking differently: Take 500 mg by mouth twice daily with meals.)   ??? multivitamin (MULTIPLE VITAMIN) PO per tablet Take 1 Tab by mouth Daily.   ??? nitroglycerin (NITROSTAT) 0.4 mg tablet 1 tab under your tongue as needed for chest pain may repeat in x 2   ??? omeprazole DR(+) (PRILOSEC) 20 mg PO capsule Take 10 mg by mouth daily.   ??? potassium chloride (K-DUR) 10 mEq tablet Take two tablets by mouth every 48 hours. May take additional 20 meq of potassium if taking additional lasix (Patient taking differently: Take 20 mEq by mouth daily. May take additional 20 meq of potassium if taking additional lasix)   ??? rosuvastatin (CRESTOR) 40 mg tablet TAKE 1 TABLET EVERY DAY   ??? vitamins, B complex tab Take 1 tablet by mouth daily.

## 2018-12-26 ENCOUNTER — Encounter: Admit: 2018-12-26 | Discharge: 2018-12-26

## 2018-12-26 DIAGNOSIS — I350 Nonrheumatic aortic (valve) stenosis: Secondary | ICD-10-CM

## 2018-12-26 DIAGNOSIS — I5033 Acute on chronic diastolic (congestive) heart failure: Secondary | ICD-10-CM

## 2018-12-26 DIAGNOSIS — I2510 Atherosclerotic heart disease of native coronary artery without angina pectoris: Secondary | ICD-10-CM

## 2018-12-26 LAB — BASIC METABOLIC PANEL
Lab: 0.8
Lab: 105
Lab: 187 — ABNORMAL HIGH (ref 70–105)
Lab: 20
Lab: 26
Lab: 4
Lab: 9.2
Lab: 140

## 2018-12-26 LAB — MAGNESIUM: Lab: 1.6

## 2018-12-26 LAB — BNP (B-TYPE NATRIURETIC PEPTI): Lab: 83

## 2018-12-30 ENCOUNTER — Encounter: Admit: 2018-12-30 | Discharge: 2018-12-30

## 2018-12-30 DIAGNOSIS — R079 Chest pain, unspecified: Secondary | ICD-10-CM

## 2018-12-30 DIAGNOSIS — E039 Hypothyroidism, unspecified: Secondary | ICD-10-CM

## 2018-12-30 DIAGNOSIS — J449 Chronic obstructive pulmonary disease, unspecified: Secondary | ICD-10-CM

## 2018-12-30 DIAGNOSIS — R9439 Abnormal result of other cardiovascular function study: Secondary | ICD-10-CM

## 2018-12-30 DIAGNOSIS — E7800 Pure hypercholesterolemia, unspecified: Secondary | ICD-10-CM

## 2018-12-30 DIAGNOSIS — I10 Essential (primary) hypertension: Secondary | ICD-10-CM

## 2018-12-30 DIAGNOSIS — Z0189 Encounter for other specified special examinations: Secondary | ICD-10-CM

## 2018-12-30 DIAGNOSIS — I2510 Atherosclerotic heart disease of native coronary artery without angina pectoris: Secondary | ICD-10-CM

## 2019-01-07 ENCOUNTER — Encounter: Admit: 2019-01-07 | Discharge: 2019-01-07

## 2019-01-07 ENCOUNTER — Ambulatory Visit: Admit: 2019-01-07 | Discharge: 2019-01-08

## 2019-01-07 DIAGNOSIS — I10 Essential (primary) hypertension: Secondary | ICD-10-CM

## 2019-01-07 DIAGNOSIS — J449 Chronic obstructive pulmonary disease, unspecified: Secondary | ICD-10-CM

## 2019-01-07 DIAGNOSIS — I5032 Chronic diastolic (congestive) heart failure: Secondary | ICD-10-CM

## 2019-01-07 DIAGNOSIS — E7800 Pure hypercholesterolemia, unspecified: Secondary | ICD-10-CM

## 2019-01-07 DIAGNOSIS — R079 Chest pain, unspecified: Secondary | ICD-10-CM

## 2019-01-07 DIAGNOSIS — I2510 Atherosclerotic heart disease of native coronary artery without angina pectoris: Secondary | ICD-10-CM

## 2019-01-07 DIAGNOSIS — E039 Hypothyroidism, unspecified: Secondary | ICD-10-CM

## 2019-01-07 DIAGNOSIS — Z0189 Encounter for other specified special examinations: Secondary | ICD-10-CM

## 2019-01-07 DIAGNOSIS — R9439 Abnormal result of other cardiovascular function study: Secondary | ICD-10-CM

## 2019-01-07 NOTE — Progress Notes
Date of Service: 01/07/2019    Mason Lawson is a 73 y.o. male.       HPI     Mr. Mason Lawson is followed for coronary artery diseases, peripheral arterial disease, hyperlipidemia, and hypertension.???   He developed some dyspnea with exertion in late May 2020 but currently is doing well.  He has been taking furosemide 40 mg 5 days a week skipping Tuesdays and Saturdays.  He has done well on this diuretic maintenance schedule without orthopnea, edema or nocturnal dyspnea.  His exercise tolerance has been stable and he can walk several blocks at a time.  Only, the patient indicates that he has been stable and he reports no congestive symptoms, palpitations, sensation of sustained forceful heart pounding, lightheadedness or syncope. His exercise tolerance has been stable.  The patient reports no myalgias, bleeding abnormalities, neurologic motor abnormalities or difficulty with speech. He denies any???claudication. He has been tolerating the 40 mg dose of rosuvastatin without adverse effects.??? Wearing support stockings usually helps him control his edema well.  Generally his blood pressure has been under good control and his systolic blood pressures tend to run 110 to 130 mmHg.  He only takes his hydralazine twice a day because he forgets to take the afternoon dose.  Historically, Mr. Mason Lawson underwent coronary artery bypass surgery in December 2005. At that time his revascularization consisted of an IMA graft to the LAD with a right internal mammary going to the obtuse marginal and a radial to the posterior descending artery. I saw Mr. Mason Lawson on 09/23/10 after an abnormal stress test was recently obtained. On 09/23/10 he told me that he had infrequent mild angina which he had noted over the past year. It occurred less than once a month and only when he was walking uphill and only when he was walking shortly after a meal. On 09/17/10 he walked 6 blocks in 10 minutes during a parade and had no angina because he had not eaten recently and was not walking uphill. I reviewed his stress test with him and he wanted to proceed with coronary angiography which was performed on 09/26/10. Intervention was not required. On 09/30/10 carvedilol was added to his medical regimen for BP control and he has tolerated this medication without difficulty. On 10/21/10 HCTZ 25 mg with triamterene 37.5 mg daily was also added to his medical regimen for improved BP control. ???The patient says that he had cellulitis in his left lower extremity in September 2015 that resolved with antibiotic therapy. Mr. Mason Lawson developed dyspnea with exertion and underwent right and left heart catheterization/coronary angiography on 06/18/2018. Intervention was not required.       Vitals:    01/07/19 0843 01/07/19 0850   BP: (!) 140/64 (!) 140/62   BP Source: Arm, Left Upper Arm, Right Upper   Pulse: 57    SpO2: 95%    Weight: 96.4 kg (212 lb 9.6 oz)    Height: 1.803 m (5' 11)    PainSc: Zero      Body mass index is 29.65 kg/m???.     Past Medical History  Patient Active Problem List    Diagnosis Date Noted   ??? Acute on chronic heart failure with preserved ejection fraction (HCC) 12/05/2018   ??? Chronic heart failure with preserved ejection fraction (HCC) 12/04/2018     RIGHT HEART CATHETERIZATION:  06/2018 in the setting of severe hypertension  HEMODYNAMICS:   Blood pressure 198/81 with a mean of 130 mmHg. Heart rate 50 beats per  minute.   PRESSURES:  1. The right atrial pressure was 4 mmHg. Right ventricular pressure was 64/8 mmHg.  2. The pulmonary artery pressure was 60/70 mmHg with a mean of 32 mmHg. The pulmonary capillary wedge pressure was 17 mmHg. The transpulmonary gradient was 15 mmHg.  3. The diastolic pulmonary gradient was 0 mmHg. The pulmonary vascular resistance was 2 Wood units.   RIGHT SATURATIONS: The right atrial saturation 77%. The pulmonary artery saturation was 75%. The aortic saturation was 96%. OUTPUTS: The cardiac output by Fick method was 5.8 L/minute with a cardiac index of 2.7 L/minute per sq m. The cardiac index by thermodilution was 7.3 with a cardiac index of 3.4 L/minute per sq m.     ??? Nonrheumatic aortic valve stenosis 06/06/2018   ??? Chest pain 09/26/2010   ??? Abnormal stress test 09/26/2010   ??? Murmur 09/23/2010   ??? Coronary artery disease      A. Onset mid 2004, progression fall 2005, patient reduces activity at work to avoid symptoms  B. 12/05 Progressive angina, Dr. Duanne Lawson, then 06/29/04 CBP Atch OV, referred for cath          >12/29 Cath 100%RCA, 75%LAD&Cx,50%LM EF 55%          >07/01/05 CABGx3 LIMA-LAD w/ Y-RIMA-OM, Rad-PDA per Dr. Ky Lawson  C. 06/29/08 exercise echocardiographic images demonstrated basal septal ischemia, significant hypertensive response to exercise, good and adequate exercise tolerance with no angina.  D. LHC 06/2018 for new heart failure symptoms: LEFT HEART CATHETERIZATION:  HEMODYNAMICS:  The aortic pressure was- 195/63 with a mean of 105 mmHg .  The left ventricular systolic pressure was 210 mmHg.  There was a peak to peak gradient of 15 mmHg.  1. Left main coronary artery:  Left main coronary artery arises normally from the left coronary sinus.  The left main coronary artery bifurcates into left anterior descending and left circumflex artery and is free of angiographically significant disease.  2. Left anterior descending:  Left anterior descending is a large vessel which gives rise to- 3 diagonal vessels. 1 diagonal has a 80% ostial stenosis, the rest of diagonal vessels do not appear to be angiographically significant. Distal LAD has competitive flow from the LIMA.   3. Left circumflex artery:  The left circumflex artery appears to be medium caliber and has a 100% stenosis proximally. The mid-to-distal portion of the vessel fills from bridging collaterals from proximal portion and is diffusely disease. Three obtuse marginal branches arise, which are also diffusely diseased.   4. Right coronary artery:  The right coronary artery is a medium caliber vessel, but has 100% chronic total occlusion after the takeoff of conus branch. The PDA fills retrogradely from the radial graft to supply the PLV and has mild plaquing in the native PDA.    BYPASS GRAFT ANATOMY:  1.The LIMA to LAD was visualized using IM catheter.  The ostium, body was noted to be free of any angiographic disease, but has a 80% stenosis at the anastamosis with diffusely diseased distal LAD. There is a Y jump graft from the LIMA to the OM branch which fills retrogradely.            2. The radial artery graft to the PDA appears to be without any angiographically significant disease at ostium, body or anastomotic site.   E. Regadenosom MPI 11/2018 TID Ratio:  1.05  (normal <1.36). Summed Stress Score:  3   , Summed Rest Score:  0 Quantitative polar maps show  a statistically insignificant amount of inferoapical ischemia. Mild inferobasilar septal hypokinesis.       Left Ventricular Ejection Fraction (post stress, in the resting state) =??? 52 %.  Left Ventricular End Diastolic Volume: 161 mL  ???    SUMMARY/OPINION:?????? This study is borderline abnormal with limited reversible uptake in the inferior wall.  All segments are viable.  Global left ventricular function is within normal limits. In aggregate the current study is low risk in regards to predicted annual cardiovascular mortality rate.  ???     ??? Hypercholesterolemia      A.  02/28/01 total 324 total 249 HDL 43 LDL 231 Pt deferred meds  B. 06/29/04 total 273 trig 193 HDL 36 LDLd206 Zocor begun dc'd by pt d/t statinphobia and cost  C.  2/24 Rx Vytorin 10/80 1/2 tab.     ??? Hypothyroidism      A.  2004 presentation with TSH over 200, Rx w l-thyroxine     ??? Hypertension      A.  2/06 Altace 5 dc'd w/ dysgeusia, Metoprolol 25 BID begun        ??? Tests ordered      A. 07/17/08 carotid artery duplex scan: no significant stenosis Review of Systems   Constitution: Negative.   HENT: Negative.    Eyes: Negative.    Cardiovascular: Positive for dyspnea on exertion.   Respiratory: Negative.    Endocrine: Negative.    Hematologic/Lymphatic: Negative.    Skin: Negative.    Musculoskeletal: Positive for neck pain.   Gastrointestinal: Negative.    Genitourinary: Negative.    Neurological: Negative.    Psychiatric/Behavioral: Negative.    Allergic/Immunologic: Negative.        Physical Exam  GENERAL: The patient is well developed, well nourished, resting comfortably and in no distress. ???  HEENT: No abnormalities of the visible oro-nasopharynx, conjunctiva or sclera are noted. ???  NECK: There is no jugular venous distension. Carotids are palpable and without bruits. There is no thyroid enlargement. ???  Chest: Lung fields are clear to auscultation. There are no wheezes or crackles. ???  CV: There is a regular rhythm. The first and second heart sounds are normal. A grade 3/6 systolic murmur is heard, loudest in the right upper sternal area. There are no murmurs, gallops or rubs.??????I checked his apical heart rate and it was???56 bpm.  ABD: The abdomen is soft and supple with normal bowel sounds. There is no hepatosplenomegaly, ascites, tenderness, masses or bruits. ???  Neuro: There are no focal motor defects. Ambulation is normal. Cognitive function appears normal. ???  Ext:???There is no edema or evidence of deep vein thrombosis.??????Peripheral pulses are decreased but he has normal capillary refill and no ischemic ulcers. ???  SKIN:???Eczema type rash near left shoulder.  Limited rash near left front belt line.  PSYCH:???The patient is calm, rationale and oriented.    Cardiovascular Studies  A twelve-lead ECG obtained on June 06, 2018 reveals normal sinus rhythm with a heart rate of 57 bpm. ???Multiple premature atrial beats are seen. ???Incomplete right bundle branch block is noted. ???The QRS durations 114 ms. Labs from 12/26/2018 revealed serum creatinine 0.81 mg/dL and serum potassium 4.0 mmol/L.  His BNP was 83 (down from 305 on 11/23/2018).  Echo Doppler 06/07/2018:  ??? Left Ventricle: Normal size. Concentric remodeling. Normal ejection fraction with LVEF=70%. The base of the inferior septum/inferior wall are akinetic.  ??? Right Ventricle: Normal size and ejection fraction.  ??? The aortic valve is tricuspid.  All leaflets are thickened/calcified with reduced excursion. Mild-moderate stenosis.(MG=6mmHg) No regurgitation.  ??? Compared with prior study dated 05/03/17, no significant change is noted.  ???  Right and left heart catheterization/coronary angiography 06/18/2018:  RIGHT HEART CATHETERIZATION:??????  HEMODYNAMICS: ???  1. Blood pressure 198/81 with a mean of 130 mmHg.  2. Heart rate 50 beats per minute.   PRESSURES:  1. The right atrial pressure was 4 mmHg.  2. Right ventricular pressure was 64/8 mmHg.  3. The pulmonary artery pressure was 60/70 mmHg with a mean of 32 mmHg.  4. The pulmonary capillary wedge pressure was 17 mmHg.  5. The transpulmonary gradient was 15 mmHg.  6. The diastolic pulmonary gradient was 0 mmHg.  7. The pulmonary vascular resistance was 2 Wood units.   ???  RIGHT SATURATIONS:  1. The right atrial saturation 77%.  2. The pulmonary artery saturation was 75%.  3. The aortic saturation was 96%.   ???  OUTPUTS:  1. The cardiac output by Fick method was 5.8 L/minute with a cardiac index of 2.7 L/minute per sq m.  2. The cardiac index by thermodilution was 7.3 with a cardiac index of 3.4 L/minute per sq m.  ???  LEFT HEART CATHETERIZATION:??????HEMODYNAMICS: ???The aortic pressure was- 195/63 with a mean of???105 mmHg . ???The left ventricular systolic pressure was 210???mmHg. ???There was a peak to peak gradient of 15 mmHg.  ???  SELECTIVE CORONARY ANGIOGRAPHY:??????  1. Left main coronary artery: ???Left main coronary artery arises normally from the left coronary sinus. ???The left main coronary artery bifurcates into left anterior descending and left circumflex artery and is free of angiographically significant disease.  2. Left anterior descending: ???Left anterior descending is a large vessel which gives rise to- 3 diagonal vessels.???1 diagonal has a 80% ostial stenosis, the rest of diagonal vessels do not appear to be angiographically significant.???Distal LAD has competitive flow from the LIMA.   3. Left circumflex artery: ???The left circumflex artery appears to be medium caliber and has a 100% stenosis proximally.???The mid-to-distal portion of the vessel fills from bridging collaterals from proximal portion and is diffusely disease. Three obtuse marginal branches arise, which are also diffusely diseased.   4. Right coronary artery: ???The right coronary artery is a medium caliber vessel, but has 100% chronic total occlusion after the takeoff of conus branch.???The PDA fills retrogradely from the radial graft to supply the PLV and has mild plaquing in the native PDA. ???  ???  BYPASS GRAFT ANATOMY:??????  1. The LIMA to LAD was visualized using IM catheter. ???The ostium, body was noted to be free of any angiographic disease, but has a 80% stenosis at the anastamosis with diffusely diseased distal LAD. There is a Y jump graft from the LIMA to the OM branch which fills retrogradely.   2. The radial artery graft to the PDA???appears to be without any angiographically significant disease???at ostium, body or anastomotic site.???  ??????  IMPRESSION:??????  1. Three-vessel coronary artery disease with unchanged bypass graft anatomy???compared to prior cardiac catheterization in 2012.  2. Normal left-sided filling pressures.  3. Normal cardiac output.    Problems Addressed Today  Chronic diastolic heart failure.  Hypertension.  Coronary artery disease.  Hypercholesterolemia.  Aortic valve stenosis  Assessment and Plan     Mr. Dinunzio's chronic diastolic heart failure currently appears very well compensated.  He can continue on his current diuretic regimen realizing that he can take additional furosemide 40 mg on Tuesdays or Saturdays for any fluid  retention.  His weight has been stable over the past month.  I reminded him to take his afternoon dose of hydralazine for blood pressure control.  Regular mild aerobic exercise, weight loss and adherence to a heart healthy diet were recommended.  I have asked him to return for follow-up in approximately 2 months time.         Current Medications (including today's revisions)  ??? amLODIPine (NORVASC) 5 mg tablet TAKE 1 TABLET TWICE DAILY   ??? amLODIPine (NORVASC) 5 mg tablet Take one tablet by mouth twice daily.   ??? aspirin EC 81 mg tablet Take 81 mg by mouth daily.   ??? carvedilol (COREG) 12.5 mg tablet TAKE 1 TABLET TWICE DAILY WITH FOOD (Patient taking differently: TAKE 1/2 TABLET TWICE DAILY WITH FOOD)   ??? cholecalciferol (vitamin D3) (OPTIMAL D3) 50,000 units capsule Take 1 capsule by mouth every 7 days.   ??? cinnamon bark (CINNAMON PO) Take 1 tablet by mouth daily.   ??? CRANBERRY FRUIT EXTRACT (CRANBERRY PO) Take 4,200 mg by mouth daily.   ??? furosemide (LASIX) 40 mg tablet Take one tablet by mouth daily. May take an additional lasix as needed   ??? GARLIC PO Take 1 tablet by mouth daily.   ??? glucosamine/chondro su A/C/Mn (GLUCOSAMINE-CHONDROITIN COMPLX PO) Take 1 tablet by mouth daily.   ??? hydrALAZINE (APRESOLINE) 50 mg tablet Take one tablet by mouth three times daily. (Patient taking differently: Take 50 mg by mouth twice daily.)   ??? levothyroxine (SYNTHROID) 175 mcg tablet Take 175 mcg by mouth daily 30 minutes before breakfast.   ??? losartan (COZAAR) 100 mg tablet TAKE 1 TABLET EVERY DAY   ??? metFORMIN (GLUCOPHAGE) 500 mg tablet Take one tablet by mouth twice daily with meals. Ok to resume 06/21/18 (Patient taking differently: Take 500 mg by mouth twice daily with meals.)   ??? multivitamin (MULTIPLE VITAMIN) PO per tablet Take 1 Tab by mouth Daily.   ??? nitroglycerin (NITROSTAT) 0.4 mg tablet 1 tab under your tongue as needed for chest pain may repeat in x 2   ??? omeprazole DR(+) (PRILOSEC) 20 mg PO capsule Take 10 mg by mouth daily.   ??? potassium chloride (KLOR-CON) 10 mEq tablet Take two tablets by mouth daily. May take additional 20 meq of potassium if taking additional lasix   ??? rosuvastatin (CRESTOR) 40 mg tablet TAKE 1 TABLET EVERY DAY   ??? vitamins, B complex tab Take 1 tablet by mouth daily.

## 2019-03-25 ENCOUNTER — Encounter: Admit: 2019-03-25 | Discharge: 2019-03-25 | Payer: MEDICARE

## 2019-03-25 ENCOUNTER — Ambulatory Visit: Admit: 2019-03-25 | Discharge: 2019-03-26 | Payer: MEDICARE

## 2019-03-25 NOTE — Progress Notes
Date of Service: 03/25/2019    Mason Lawson is a 73 y.o. male.       HPI     Mr. Mason Lawson is followed for coronary artery diseases, peripheral arterial disease, hyperlipidemia, and hypertension.??   He has done extremely well on his current diuretic dosage and is not required any supplemental furosemide. He has been taking furosemide 40 mg 5 days a week skipping Tuesdays and Saturdays.  He has done well on this diuretic maintenance schedule without orthopnea, edema or nocturnal dyspnea.  His exercise tolerance has improved and he can walk extended distances without difficulty.    Recently,?the patient indicates that he has been stable and?he reports no congestive symptoms, palpitations, sensation of sustained forceful heart pounding, lightheadedness or syncope.?The patient reports no myalgias, bleeding abnormalities, neurologic motor abnormalities or difficulty with speech. He denies any?claudication. He has been tolerating the 40 mg dose of rosuvastatin without adverse effects.??Wearing support stockings usually helps him control his edema well.  Generally his blood pressure has been under good control and his systolic blood pressures tend to run 110 to 130 mmHg.      Historically, Mr. Mason Lawson underwent coronary artery bypass surgery in December 2005. At that time his revascularization consisted of an IMA graft to the LAD with a right internal mammary going to the obtuse marginal and a radial to the posterior descending artery. I saw [name] on 09/23/10 after an abnormal stress test was recently obtained. On 09/23/10 he told me that he had infrequent mild angina which he had noted over the past year. It occurred less than once a month and only when he was walking uphill and only when he was walking shortly after a meal. On 09/17/10 he walked 6 blocks in 10 minutes during a parade and had no angina because he had not eaten recently and was not walking uphill. I reviewed his stress test with him and he wanted to proceed with coronary angiography which was performed on 09/26/10. Intervention was not required. On 09/30/10 carvedilol was added to his medical regimen for BP control and he has tolerated this medication without difficulty. On 10/21/10 HCTZ 25 mg with triamterene 37.5 mg daily was also added to his medical regimen for improved BP control. ?The patient says that he had cellulitis in his left lower extremity in September 2015 that resolved with antibiotic therapy. Mr. Mason Lawson?developed dyspnea with exertion and?underwent right and left heart catheterization/coronary angiography on 06/18/2018.?Intervention was not required.         Vitals:    03/25/19 0846 03/25/19 0853   BP: 122/62 130/66   BP Source: Arm, Left Upper Arm, Right Upper   Pulse: 49    Temp: 36.5 ?C (97.7 ?F)    SpO2: 95%    Weight: 99.8 kg (220 lb)    Height: 1.803 m (5' 11)    PainSc: Zero      Body mass index is 30.68 kg/m?[name]     Past Medical History  Patient Active Problem List    Diagnosis Date Noted   ? Acute on chronic heart failure with preserved ejection fraction (HCC) 12/05/2018   ? Chronic heart failure with preserved ejection fraction (HCC) 12/04/2018     RIGHT HEART CATHETERIZATION:  06/2018 in the setting of severe hypertension  HEMODYNAMICS:   Blood pressure 198/81 with a mean of 130 mmHg. Heart rate 50 beats per minute.   PRESSURES:  1. The right atrial pressure was 4 mmHg. Right ventricular pressure was 64/8 mmHg.  2. The pulmonary artery pressure was 60/70 mmHg with a mean of 32 mmHg. The pulmonary capillary wedge pressure was 17 mmHg. The transpulmonary gradient was 15 mmHg.  3. The diastolic pulmonary gradient was 0 mmHg. The pulmonary vascular resistance was 2 Wood units.   RIGHT SATURATIONS: The right atrial saturation 77%. The pulmonary artery saturation was 75%. The aortic saturation was 96%.   OUTPUTS: The cardiac output by Fick method was 5.8 L/minute with a cardiac index of 2.7 L/minute per sq m. The cardiac index by thermodilution was 7.3 with a cardiac index of 3.4 L/minute per sq m.     ? Nonrheumatic aortic valve stenosis 06/06/2018   ? Chest pain 09/26/2010   ? Abnormal stress test 09/26/2010   ? Murmur 09/23/2010   ? Coronary artery disease      A. Onset mid 2004, progression fall 2005, patient reduces activity at work to avoid symptoms  B. 12/05 Progressive angina, Dr. Duanne Limerick, then 06/29/04 CBP Atch OV, referred for cath          >12/29 Cath 100%RCA, 75%LAD&Cx,50%LM EF 55%          >07/01/05 CABGx3 LIMA-LAD w/ Y-RIMA-OM, Rad-PDA per Dr. Ky Barban  C. 06/29/08 exercise echocardiographic images demonstrated basal septal ischemia, significant hypertensive response to exercise, good and adequate exercise tolerance with no angina.  D. LHC 06/2018 for new heart failure symptoms: LEFT HEART CATHETERIZATION:  HEMODYNAMICS:  The aortic pressure was- 195/63 with a mean of 105 mmHg .  The left ventricular systolic pressure was 210 mmHg.  There was a peak to peak gradient of 15 mmHg.  1. Left main coronary artery:  Left main coronary artery arises normally from the left coronary sinus.  The left main coronary artery bifurcates into left anterior descending and left circumflex artery and is free of angiographically significant disease.  2. Left anterior descending:  Left anterior descending is a large vessel which gives rise to- 3 diagonal vessels. 1 diagonal has a 80% ostial stenosis, the rest of diagonal vessels do not appear to be angiographically significant. Distal LAD has competitive flow from the LIMA.   3. Left circumflex artery:  The left circumflex artery appears to be medium caliber and has a 100% stenosis proximally. The mid-to-distal portion of the vessel fills from bridging collaterals from proximal portion and is diffusely disease. Three obtuse marginal branches arise, which are also diffusely diseased. 4. Right coronary artery:  The right coronary artery is a medium caliber vessel, but has 100% chronic total occlusion after the takeoff of conus branch. The PDA fills retrogradely from the radial graft to supply the PLV and has mild plaquing in the native PDA.    BYPASS GRAFT ANATOMY:  1.The LIMA to LAD was visualized using IM catheter.  The ostium, body was noted to be free of any angiographic disease, but has a 80% stenosis at the anastamosis with diffusely diseased distal LAD. There is a Y jump graft from the LIMA to the OM branch which fills retrogradely.            2. The radial artery graft to the PDA appears to be without any angiographically significant disease at ostium, body or anastomotic site.   E. Regadenosom MPI 11/2018 TID Ratio:  1.05  (normal <1.36). Summed Stress Score:  3   , Summed Rest Score:  0 Quantitative polar maps show a statistically insignificant amount of inferoapical ischemia. Mild inferobasilar septal hypokinesis.       Left Ventricular Ejection  Fraction (post stress, in the resting state) =? 52 %.  Left Ventricular End Diastolic Volume: 161 mL  ?    SUMMARY/OPINION:?? This study is borderline abnormal with limited reversible uptake in the inferior wall.  All segments are viable.  Global left ventricular function is within normal limits. In aggregate the current study is low risk in regards to predicted annual cardiovascular mortality rate.  ?     ? Hypercholesterolemia      A.  02/28/01 total 324 total 249 HDL 43 LDL 231 Pt deferred meds  B. 06/29/04 total 273 trig 193 HDL 36 LDLd206 Zocor begun dc'd by pt d/t statinphobia and cost  C.  2/24 Rx Vytorin 10/80 1/2 tab.     ? Hypothyroidism      A.  2004 presentation with TSH over 200, Rx w l-thyroxine     ? Hypertension      A.  2/06 Altace 5 dc'd w/ dysgeusia, Metoprolol 25 BID begun        ? Tests ordered      A. 07/17/08 carotid artery duplex scan: no significant stenosis           Review of Systems   Constitution: Negative. HENT: Negative.    Eyes: Negative.    Cardiovascular: Positive for leg swelling.   Respiratory: Positive for sleep disturbances due to breathing.    Endocrine: Negative.    Hematologic/Lymphatic: Negative.    Skin: Negative.    Musculoskeletal: Positive for arthritis, back pain, joint pain, neck pain and stiffness.   Gastrointestinal: Negative.    Genitourinary: Negative.    Neurological: Negative.    Psychiatric/Behavioral: Negative.    Allergic/Immunologic: Negative.        Physical Exam  GENERAL: The patient is well developed, well nourished, resting comfortably and in no distress. ?  HEENT: No abnormalities of the visible oro-nasopharynx, conjunctiva or sclera are noted. ?  NECK: There is no jugular venous distension. Carotids are palpable and without bruits. There is no thyroid enlargement. ?  Chest: Lung fields are clear to auscultation. There are no wheezes or crackles. ?  CV: There is a regular rhythm. The first and second heart sounds are normal. A grade 3/6 systolic murmur is heard, loudest in the right upper sternal area. There are no murmurs, gallops or rubs.??I checked his apical heart rate and it was?56?bpm.  ABD: The abdomen is soft and supple with normal bowel sounds. There is no hepatosplenomegaly, ascites, tenderness, masses or bruits. ?  Neuro: There are no focal motor defects. Ambulation is normal. Cognitive function appears normal. ?  Ext:?There is no edema or evidence of deep vein thrombosis.??Peripheral pulses are decreased but he has normal capillary refill and no ischemic ulcers. ?  SKIN:?Eczema type rash near left shoulder. ?Limited rash near left front belt line.  PSYCH:?The patient is calm, rationale and oriented.    Cardiovascular Studies  A twelve-lead ECG obtained on June 06, 2018 reveals normal sinus rhythm with a heart rate of 57 bpm. ?Multiple premature atrial beats are seen. ?Incomplete right bundle branch block is noted. ?The QRS durations 114 ms.  ? Labs from 12/26/2018 revealed serum creatinine 0.81 mg/dL and serum potassium 4.0 mmol/L.  His BNP was 83 (down from 305 on 11/23/2018).  Echo Doppler 06/07/2018:  ? Left Ventricle: Normal size. Concentric remodeling. Normal ejection fraction with LVEF=70%. The base of the inferior septum/inferior wall are akinetic.  ? Right Ventricle: Normal size and ejection fraction.  ? The aortic valve is tricuspid.  All leaflets are thickened/calcified with reduced excursion. Mild-moderate stenosis.(MG=50mmHg) No regurgitation.  ? Compared with prior study dated 05/03/17, no significant change is noted.  ?  Right and left heart catheterization/coronary angiography 06/18/2018:  RIGHT HEART CATHETERIZATION:??  HEMODYNAMICS: ?  4. Blood pressure 198/81 with a mean of 130 mmHg.  5. Heart rate 50 beats per minute.   PRESSURES:  1. The right atrial pressure was 4 mmHg.  2. Right ventricular pressure was 64/8 mmHg.  3. The pulmonary artery pressure was 60/70 mmHg with a mean of 32 mmHg.  4. The pulmonary capillary wedge pressure was 17 mmHg.  5. The transpulmonary gradient was 15 mmHg.  6. The diastolic pulmonary gradient was 0 mmHg.  7. The pulmonary vascular resistance was 2 Wood units.   ?  RIGHT SATURATIONS:  1. The right atrial saturation 77%.  2. The pulmonary artery saturation was 75%.  3. The aortic saturation was 96%.   ?  OUTPUTS:  1. The cardiac output by Fick method was 5.8 L/minute with a cardiac index of 2.7 L/minute per sq m.  2. The cardiac index by thermodilution was 7.3 with a cardiac index of 3.4 L/minute per sq m.  ?  LEFT HEART CATHETERIZATION:??HEMODYNAMICS: ?The aortic pressure was- 195/63 with a mean of?105 mmHg . ?The left ventricular systolic pressure was 210?mmHg. ?There was a peak to peak gradient of 15 mmHg.  ?  SELECTIVE CORONARY ANGIOGRAPHY:??  1. Left main coronary artery: ?Left main coronary artery arises normally from the left coronary sinus. ?The left main coronary artery bifurcates into left anterior descending and left circumflex artery and is free of angiographically significant disease.  2. Left anterior descending: ?Left anterior descending is a large vessel which gives rise to- 3 diagonal vessels.?1 diagonal has a 80% ostial stenosis, the rest of diagonal vessels do not appear to be angiographically significant.?Distal LAD has competitive flow from the LIMA.   3. Left circumflex artery: ?The left circumflex artery appears to be medium caliber and has a 100% stenosis proximally.?The mid-to-distal portion of the vessel fills from bridging collaterals from proximal portion and is diffusely disease. Three obtuse marginal branches arise, which are also diffusely diseased.   4. Right coronary artery: ?The right coronary artery is a medium caliber vessel, but has 100% chronic total occlusion after the takeoff of conus branch.?The PDA fills retrogradely from the radial graft to supply the PLV and has mild plaquing in the native PDA. ?  ?  BYPASS GRAFT ANATOMY:??  1. The LIMA to LAD was visualized using IM catheter. ?The ostium, body was noted to be free of any angiographic disease, but has a 80% stenosis at the anastamosis with diffusely diseased distal LAD. There is a Y jump graft from the LIMA to the OM branch which fills retrogradely.   2. The radial artery graft to the PDA?appears to be without any angiographically significant disease?at ostium, body or anastomotic site.?  ??  IMPRESSION:??  1. Three-vessel coronary artery disease with unchanged bypass graft anatomy?compared to prior cardiac catheterization in 2012.  2. Normal left-sided filling pressures.  3. Normal cardiac output  4.   Problems addressed today:  Chronic diastolic heart failure.  Hypertension.  Coronary artery disease.  Hypercholesterolemia.  Aortic valve stenosis  Assessment and Plan  Mr. Prouse's chronic diastolic heart failure currently appears very well compensated.  He can continue on his current diuretic regimen realizing that he can take additional furosemide 40 mg on Tuesdays or Saturdays for any fluid retention.  I reminded him to take his afternoon dose of hydralazine for blood pressure control.  Regular mild aerobic exercise, weight loss and adherence to a heart healthy diet were recommended.  I have asked him to return for follow-up in approximately 4 months time.              Current Medications (including today's revisions)  ? amLODIPine (NORVASC) 5 mg tablet Take one tablet by mouth twice daily.   ? aspirin EC 81 mg tablet Take 81 mg by mouth daily.   ? carvedilol (COREG) 12.5 mg tablet TAKE 1 TABLET TWICE DAILY WITH FOOD (Patient taking differently: TAKE 1/2 TABLET TWICE DAILY WITH FOOD)   ? cholecalciferol (VITAMIN D-3) 1,000 units tablet Take 1,000 Units by mouth daily.   ? CRANBERRY FRUIT EXTRACT (CRANBERRY PO) Take 4,200 mg by mouth daily.   ? furosemide (LASIX) 40 mg tablet Take one tablet by mouth daily. May take an additional lasix as needed (Patient taking differently: Take 40 mg by mouth five times weekly. May take an additional lasix as needed)   ? GARLIC PO Take 1 tablet by mouth daily.   ? glucosamine/chondro su A/C/Mn (GLUCOSAMINE-CHONDROITIN COMPLX PO) Take 1 tablet by mouth daily.   ? hydrALAZINE (APRESOLINE) 50 mg tablet Take one tablet by mouth three times daily. (Patient taking differently: Take 50 mg by mouth twice daily.)   ? levothyroxine (SYNTHROID) 175 mcg tablet Take 175 mcg by mouth daily 30 minutes before breakfast.   ? losartan (COZAAR) 100 mg tablet TAKE 1 TABLET EVERY DAY   ? metFORMIN (GLUCOPHAGE) 500 mg tablet Take one tablet by mouth twice daily with meals. Ok to resume 06/21/18 (Patient taking differently: Take 500 mg by mouth twice daily with meals.)   ? multivitamin (MULTIPLE VITAMIN) PO per tablet Take 1 Tab by mouth Daily.   ? nitroglycerin (NITROSTAT) 0.4 mg tablet 1 tab under your tongue as needed for chest pain may repeat in x 2 ? omeprazole DR(+) (PRILOSEC) 20 mg PO capsule Take 10 mg by mouth daily.   ? potassium chloride (KLOR-CON) 10 mEq tablet Take two tablets by mouth daily. May take additional 20 meq of potassium if taking additional lasix (Patient taking differently: Take 20 mEq by mouth five times weekly. May take additional 20 meq of potassium if taking additional lasix)   ? rosuvastatin (CRESTOR) 40 mg tablet TAKE 1 TABLET EVERY DAY   ? vitamins, B complex tab Take 1 tablet by mouth daily.

## 2019-05-07 ENCOUNTER — Encounter: Admit: 2019-05-07 | Discharge: 2019-05-07 | Payer: MEDICARE

## 2019-05-07 DIAGNOSIS — I350 Nonrheumatic aortic (valve) stenosis: Secondary | ICD-10-CM

## 2019-05-07 DIAGNOSIS — I2510 Atherosclerotic heart disease of native coronary artery without angina pectoris: Secondary | ICD-10-CM

## 2019-05-07 MED ORDER — POTASSIUM CHLORIDE 10 MEQ PO TBER
20 meq | ORAL_TABLET | ORAL | 3 refills | 30.00000 days | Status: DC
Start: 2019-05-07 — End: 2019-10-14

## 2019-05-14 ENCOUNTER — Encounter: Admit: 2019-05-14 | Discharge: 2019-05-14 | Payer: MEDICARE

## 2019-05-14 MED ORDER — LOSARTAN 100 MG PO TAB
100 mg | ORAL_TABLET | Freq: Every day | ORAL | 3 refills | 90.00000 days | Status: AC
Start: 2019-05-14 — End: ?

## 2019-05-29 ENCOUNTER — Encounter: Admit: 2019-05-29 | Discharge: 2019-05-29 | Payer: MEDICARE

## 2019-06-02 MED ORDER — FUROSEMIDE 40 MG PO TAB
ORAL_TABLET | Freq: Every day | ORAL | 3 refills | 90.00000 days | Status: AC | PRN
Start: 2019-06-02 — End: ?

## 2019-07-20 ENCOUNTER — Encounter: Admit: 2019-07-20 | Discharge: 2019-07-20 | Payer: MEDICARE

## 2019-07-20 DIAGNOSIS — I10 Essential (primary) hypertension: Secondary | ICD-10-CM

## 2019-07-22 MED ORDER — AMLODIPINE 5 MG PO TAB
ORAL_TABLET | Freq: Two times a day (BID) | 3 refills | Status: AC
Start: 2019-07-22 — End: ?

## 2019-08-19 ENCOUNTER — Encounter: Admit: 2019-08-19 | Discharge: 2019-08-19 | Payer: MEDICARE

## 2019-08-19 DIAGNOSIS — I2510 Atherosclerotic heart disease of native coronary artery without angina pectoris: Secondary | ICD-10-CM

## 2019-08-19 DIAGNOSIS — I10 Essential (primary) hypertension: Secondary | ICD-10-CM

## 2019-08-21 ENCOUNTER — Encounter: Admit: 2019-08-21 | Discharge: 2019-08-21 | Payer: MEDICARE

## 2019-08-21 DIAGNOSIS — I2510 Atherosclerotic heart disease of native coronary artery without angina pectoris: Secondary | ICD-10-CM

## 2019-08-21 DIAGNOSIS — I10 Essential (primary) hypertension: Secondary | ICD-10-CM

## 2019-08-21 DIAGNOSIS — E7800 Pure hypercholesterolemia, unspecified: Secondary | ICD-10-CM

## 2019-08-21 DIAGNOSIS — E039 Hypothyroidism, unspecified: Secondary | ICD-10-CM

## 2019-08-21 DIAGNOSIS — J449 Chronic obstructive pulmonary disease, unspecified: Secondary | ICD-10-CM

## 2019-08-21 DIAGNOSIS — R079 Chest pain, unspecified: Secondary | ICD-10-CM

## 2019-08-21 DIAGNOSIS — R9439 Abnormal result of other cardiovascular function study: Secondary | ICD-10-CM

## 2019-08-21 DIAGNOSIS — Z0189 Encounter for other specified special examinations: Secondary | ICD-10-CM

## 2019-10-02 ENCOUNTER — Encounter: Admit: 2019-10-02 | Discharge: 2019-10-02 | Payer: MEDICARE

## 2019-10-02 MED ORDER — HYDRALAZINE 50 MG PO TAB
50 mg | ORAL_TABLET | Freq: Two times a day (BID) | ORAL | 3 refills | 42.50000 days | Status: AC
Start: 2019-10-02 — End: ?

## 2019-10-08 ENCOUNTER — Encounter: Admit: 2019-10-08 | Discharge: 2019-10-08 | Payer: MEDICARE

## 2019-10-08 ENCOUNTER — Ambulatory Visit: Admit: 2019-10-08 | Discharge: 2019-10-08 | Payer: MEDICARE

## 2019-10-08 DIAGNOSIS — R0602 Shortness of breath: Secondary | ICD-10-CM

## 2019-10-08 DIAGNOSIS — R778 Other specified abnormalities of plasma proteins: Secondary | ICD-10-CM

## 2019-10-08 NOTE — Progress Notes
Patient is in ED with generalized weakness and HTN. Dr. Charleston Ropes would like [facility] to compare EKGs and to provide recommendations.

## 2019-10-14 ENCOUNTER — Encounter: Admit: 2019-10-14 | Discharge: 2019-10-14 | Payer: MEDICARE

## 2019-10-14 MED ORDER — SPIRONOLACTONE 25 MG PO TAB
25 mg | ORAL_TABLET | Freq: Every day | ORAL | 1 refills | 46.00000 days | Status: DC
Start: 2019-10-14 — End: 2019-11-25

## 2019-11-18 ENCOUNTER — Encounter: Admit: 2019-11-18 | Discharge: 2019-11-18 | Payer: MEDICARE

## 2019-11-18 DIAGNOSIS — E7800 Pure hypercholesterolemia, unspecified: Secondary | ICD-10-CM

## 2019-11-18 DIAGNOSIS — I10 Essential (primary) hypertension: Secondary | ICD-10-CM

## 2019-11-18 DIAGNOSIS — R079 Chest pain, unspecified: Secondary | ICD-10-CM

## 2019-11-18 DIAGNOSIS — E039 Hypothyroidism, unspecified: Secondary | ICD-10-CM

## 2019-11-18 DIAGNOSIS — I5032 Chronic diastolic (congestive) heart failure: Secondary | ICD-10-CM

## 2019-11-18 DIAGNOSIS — R9439 Abnormal result of other cardiovascular function study: Secondary | ICD-10-CM

## 2019-11-18 DIAGNOSIS — I2510 Atherosclerotic heart disease of native coronary artery without angina pectoris: Secondary | ICD-10-CM

## 2019-11-18 DIAGNOSIS — J449 Chronic obstructive pulmonary disease, unspecified: Secondary | ICD-10-CM

## 2019-11-18 DIAGNOSIS — Z0189 Encounter for other specified special examinations: Secondary | ICD-10-CM

## 2019-11-18 NOTE — Progress Notes
Date of Service: 11/18/2019    Mason Lawson is a 74 y.o. male.       Chief complaint: Follow-up, heart failure    History of present illness:     I had the pleasure of seeing Mason Lawson in our [place] office this morning for cardiovascular followup.     As you know, Mason Lawson is a remarkably pleasant 74 year old gentleman who follows with my partner, Kyung Bacca, M.D.  He has a history of coronary artery disease S/P coronary artery bypass surgery in 2005 with left internal mammary artery conduit to left anterior descending artery, right internal mammary artery conduit to obtuse marginal artery, and radial artery conduit to the right posterior descending artery, hypertension, dyslipidemia, peripheral arterial disease, and moderate aortic valve stenosis.  Mason Lawson was recently hospitalized at [facility] in April of 2021.  He had developed leg swelling, leg weakness, and elevated blood pressures at home.  When he presented to the hospital, his blood pressure was 227/70 mmHg.  He was ultimately found to be markedly volume overloaded and was admitted to the hospital for a couple of days for IV diuretics.  During his hospitalization, he had a transthoracic echocardiogram which revealed moderate aortic valve stenosis, normal left ventricular systolic function, and moderate diastolic dysfunction.  He was ultimately discharged home on his home dose of furosemide.     I first met Mason Lawson shortly after his hospital discharge on October 15, 2019.  At that time, his blood pressures were still running a little on the high side.  Home systolic readings were in the 161-096 mmHg range.  We added spironolactone 25 mg daily to his medication regimen to better help his blood pressure control as well as improve his lower extremity swelling.     Since our last visit a month ago, Mason Lawson has really been doing quite well.  He tells me that the fluid is much better in his legs.  He gets a little bit of swelling during the day when he is on his feet, but when lying in bed at night, the swelling goes away entirely.  He is not experiencing any limiting shortness of breath.  He is able to work his [employer] job without any issue.  He is not experiencing any chest pain.  No palpitations, lightheadedness, dizziness, orthopnea, paroxysmal nocturnal dyspnea, or leg pain with ambulation.     He has been watching his blood pressure and weights at home quite regularly.  It looks like his weight fluctuates from 222 pounds to 227 pounds.  I suspect a lot of this depends on his salt intake.  His home blood pressure readings show systolic pressures in the 140-160 mmHg range on a regular basis.  He is not having any issues with his current medication regimen.         Vitals:    11/18/19 0759   BP: 126/68   BP Source: Arm, Left Upper   Patient Position: Sitting   Pulse: 51   SpO2: 97%   Weight: 102.5 kg (226 lb)   Height: 1.803 m (5' 11)   PainSc: Zero     Body mass index is 31.52 kg/m?[name]     Past Medical History  Patient Active Problem List    Diagnosis Date Noted   ? Acute on chronic heart failure with preserved ejection fraction (HCC) 12/05/2018   ? Chronic heart failure with preserved ejection fraction (HCC) 12/04/2018     RIGHT HEART CATHETERIZATION:  06/2018 in  the setting of severe hypertension  HEMODYNAMICS:   Blood pressure 198/81 with a mean of 130 mmHg. Heart rate 50 beats per minute.   PRESSURES:  1. The right atrial pressure was 4 mmHg. Right ventricular pressure was 64/8 mmHg.  2. The pulmonary artery pressure was 60/70 mmHg with a mean of 32 mmHg. The pulmonary capillary wedge pressure was 17 mmHg. The transpulmonary gradient was 15 mmHg.  3. The diastolic pulmonary gradient was 0 mmHg. The pulmonary vascular resistance was 2 Wood units.   RIGHT SATURATIONS: The right atrial saturation 77%. The pulmonary artery saturation was 75%. The aortic saturation was 96%.   OUTPUTS: The cardiac output by Fick method was 5.8 L/minute with a cardiac index of 2.7 L/minute per sq m. The cardiac index by thermodilution was 7.3 with a cardiac index of 3.4 L/minute per sq m.     ? Nonrheumatic aortic valve stenosis 06/06/2018   ? Chest pain 09/26/2010   ? Abnormal stress test 09/26/2010   ? Murmur 09/23/2010   ? Coronary artery disease      A. Onset mid 2004, progression fall 2005, patient reduces activity at work to avoid symptoms  B. 12/05 Progressive angina, Dr. Duanne Limerick, then 06/29/04 CBP Atch OV, referred for cath          >12/29 Cath 100%RCA, 75%LAD&Cx,50%LM EF 55%          >07/01/05 CABGx3 LIMA-LAD w/ Y-RIMA-OM, Rad-PDA per Dr. Ky Barban  C. 06/29/08 exercise echocardiographic images demonstrated basal septal ischemia, significant hypertensive response to exercise, good and adequate exercise tolerance with no angina.  D. LHC 06/2018 for new heart failure symptoms: LEFT HEART CATHETERIZATION:  HEMODYNAMICS:  The aortic pressure was- 195/63 with a mean of 105 mmHg .  The left ventricular systolic pressure was 210 mmHg.  There was a peak to peak gradient of 15 mmHg.  1. Left main coronary artery:  Left main coronary artery arises normally from the left coronary sinus.  The left main coronary artery bifurcates into left anterior descending and left circumflex artery and is free of angiographically significant disease.  2. Left anterior descending:  Left anterior descending is a large vessel which gives rise to- 3 diagonal vessels. 1 diagonal has a 80% ostial stenosis, the rest of diagonal vessels do not appear to be angiographically significant. Distal LAD has competitive flow from the LIMA.   3. Left circumflex artery:  The left circumflex artery appears to be medium caliber and has a 100% stenosis proximally. The mid-to-distal portion of the vessel fills from bridging collaterals from proximal portion and is diffusely disease. Three obtuse marginal branches arise, which are also diffusely diseased.   4. Right coronary artery: The right coronary artery is a medium caliber vessel, but has 100% chronic total occlusion after the takeoff of conus branch. The PDA fills retrogradely from the radial graft to supply the PLV and has mild plaquing in the native PDA.    BYPASS GRAFT ANATOMY:  1.The LIMA to LAD was visualized using IM catheter.  The ostium, body was noted to be free of any angiographic disease, but has a 80% stenosis at the anastamosis with diffusely diseased distal LAD. There is a Y jump graft from the LIMA to the OM branch which fills retrogradely.            2. The radial artery graft to the PDA appears to be without any angiographically significant disease at ostium, body or anastomotic site.   E. Regadenosom MPI 11/2018  TID Ratio:  1.05  (normal <1.36). Summed Stress Score:  3   , Summed Rest Score:  0 Quantitative polar maps show a statistically insignificant amount of inferoapical ischemia. Mild inferobasilar septal hypokinesis.       Left Ventricular Ejection Fraction (post stress, in the resting state) =? 52 %.  Left Ventricular End Diastolic Volume: 454 mL  ?    SUMMARY/OPINION:?? This study is borderline abnormal with limited reversible uptake in the inferior wall.  All segments are viable.  Global left ventricular function is within normal limits. In aggregate the current study is low risk in regards to predicted annual cardiovascular mortality rate.  ?     ? Hypercholesterolemia      A.  02/28/01 total 324 total 249 HDL 43 LDL 231 Pt deferred meds  B. 06/29/04 total 273 trig 193 HDL 36 LDLd206 Zocor begun dc'd by pt d/t statinphobia and cost  C.  2/24 Rx Vytorin 10/80 1/2 tab.     ? Hypothyroidism      A.  2004 presentation with TSH over 200, Rx w l-thyroxine     ? Hypertension      A.  2/06 Altace 5 dc'd w/ dysgeusia, Metoprolol 25 BID begun        ? Tests ordered      A. 07/17/08 carotid artery duplex scan: no significant stenosis         Review of Systems   Constitution: Negative.   HENT: Negative.    Eyes: Negative. Cardiovascular: Negative.    Respiratory: Negative.    Endocrine: Negative.    Hematologic/Lymphatic: Negative.    Skin: Negative.    Musculoskeletal: Negative.    Gastrointestinal: Negative.    Genitourinary: Negative.    Neurological: Negative.    Psychiatric/Behavioral: Negative.    Allergic/Immunologic: Negative.        Physical Exam  General Appearance: No acute distress. Fully alert and oriented.  Skin: Warm. No ulcers or xanthomas.   HEENT: Grossly unremarkable. Lips and oral mucosa without pallor or cyanosis. Moist mucous membranes.   Neck Veins: Normal jugular venous pressure. Neck veins are not distended.  Carotid Arteries: Normal carotid upstroke bilaterally. No bruits.  Chest Inspection: Midline scar consistent with prior sternotomyChest is normal in appearance.  Auscultation/Percussion: Normal respiratory effort. Lungs clear to auscultation bilaterally. No wheezes, rales, or rhonchi.    Cardiac Rhythm: Regular rhythm. Normal rate.  Cardiac Auscultation: Normal S1 & S2. No S3 or S4. No rub.  Murmurs: 4/6 systolic murmur heard best at the right upper sternal border.  S2 is audible.    Peripheral Circulation: Normal peripheral circulation.   Abdominal Aorta: No abdominal aortic bruit.  Extremities: Appropriately warm to touch.  1+ pitting edema to the midshin in bilateral lower extremities.  Abdominal Exam: Soft, non-tender. No masses, no organomegaly. Normal bowel sounds.  Neurologic Exam: Neurological assessment grossly intact.    Assessment and Plan     1. Heart failure with preserved ejection fraction:  Mason Lawson was recently hospitalized in April of 2021 with decompensated heart failure with preserved ejection fraction.  Since being discharged home, he has really done quite well.  He tells me his lower extremity swelling is much better today, and this is confirmed on physical examination.  He does still have some residual 1+ pitting edema to the bilateral lower extremities, but this is not particularly bothersome for him and he tells me it goes away entirely when he is not on his feet.  He does  not have elevated jugular venous pressure today.  We are going to continue his current dose of Lasix 40 mg daily.  He will continue to watch his weight and salt intake at home.  If his weight increases by more than 3 pounds, he will take an additional dose of Lasix and let our office know.  We will continue spironolactone 25 mg daily, as he has been tolerating the medication well.  Repeat basic metabolic panel today.  I do think his blood pressure is much better controlled.  His reading in the office today is 126/68 mmHg.  His home readings are a little bit higher with systolics consistently 140-160 mmHg range.  We have asked him to bring his cuff into the office to correlate the two.  If he is in fact still running high at home, then we may need to make some adjustments to his medication regimen.    2. Moderate aortic valve stenosis:  He had an echocardiogram on October 08, 2019, which demonstrated moderate aortic valve stenosis.  Plan to repeat transthoracic echocardiogram in 1 year.  He is currently asymptomatic.    3. Coronary artery disease.  He has stable coronary artery disease with history of coronary artery bypass surgery in 2005.  His most recent stress test was in 2020, which showed very mild reversible inferior wall perfusion abnormality.  In total, it was a low risk test.  His last left heart catheterization was in 2019, which did show diffusely diseased distal left anterior descending artery and stenosis at the LIMA conduit anastomosis.  We will continue to manage medically, but if he has further decompensated heart failure in the future, I would consider left heart catheterization to re-evaluate his coronary arteries.  Continue aspirin 81 mg daily.  Management of dyslipidemia as below.    4. Hypertension:  He is at goal today.  126/68 mmHg.  Continue current medication regimen.  We again talked about salt intake and being cautious about the foods he is eating at home.  He thinks he is doing a better job of limiting the sodium content.    5. Dyslipidemia:  His most recent lipid panel from 10/22/2019, shows triglycerides 154, HDL 31, LDL 82.  It turns out he was actually taking half of his prescribed rosuvastatin dose.  He was on 20 mg daily.  We have now increased this to 40 mg daily.  Repeat fasting lipid panel in 3 months.  His goal LDL is less than 70.     I really enjoyed seeing Mason Lawson back in the office today, and I appreciate the opportunity to take part in his care.  He has followup with his primary cardiologist, Kyung Bacca, M.D., in August.  If I can be of any assistance between now and then, please do not hesitate to reach out with questions or concerns.         Total time spent on today's office visit was 35 minutes. This includes face-to-face in person visit with patient as well as non face-to-face time including review of the electronic medical record, outside records, labs, radiologic studies, cardiovascular studies, formulation of treatment plan, after visit summary, future disposition, personal discussions, and documentation.      Current Medications (including today's revisions)  ? amLODIPine (NORVASC) 5 mg tablet TAKE 1 TABLET TWICE DAILY   ? aspirin EC 81 mg tablet Take 81 mg by mouth daily.   ? carvedilol (COREG) 12.5 mg tablet TAKE 1 TABLET TWICE DAILY WITH FOOD (Patient taking  differently: TAKE 1/2 TABLET TWICE DAILY WITH FOOD)   ? cholecalciferol (VITAMIN D-3) 1,000 units tablet Take 1,000 Units by mouth daily.   ? CRANBERRY FRUIT EXTRACT (CRANBERRY PO) Take 4,200 mg by mouth daily.   ? furosemide (LASIX) 40 mg tablet TAKE ONE TABLET DAILY. MAY TAKE AN ADDITIONAL LASIX AS NEEDED (DOSE ADJUSTMENT)   ? GARLIC PO Take 1 tablet by mouth daily.   ? glucosamine/chondro su A/C/Mn (GLUCOSAMINE-CHONDROITIN COMPLX PO) Take 1 tablet by mouth daily.   ? hydrALAZINE (APRESOLINE) 50 mg tablet Take one tablet by mouth twice daily.   ? levothyroxine (SYNTHROID) 175 mcg tablet Take 175 mcg by mouth daily 30 minutes before breakfast.   ? losartan (COZAAR) 100 mg tablet Take one tablet by mouth daily.   ? metFORMIN (GLUCOPHAGE) 500 mg tablet Take one tablet by mouth twice daily with meals. Ok to resume 06/21/18 (Patient taking differently: Take 500 mg by mouth twice daily with meals.)   ? multivitamin (MULTIPLE VITAMIN) PO per tablet Take 1 Tab by mouth Daily.   ? nitroglycerin (NITROSTAT) 0.4 mg tablet 1 tab under your tongue as needed for chest pain may repeat in x 2   ? omeprazole DR(+) (PRILOSEC) 20 mg PO capsule Take 10 mg by mouth daily.   ? rosuvastatin (CRESTOR) 40 mg tablet TAKE 1 TABLET EVERY DAY   ? spironolactone (ALDACTONE) 25 mg tablet Take one tablet by mouth daily. Take with food.   ? vitamins, B complex tab Take 1 tablet by mouth daily.

## 2019-11-24 ENCOUNTER — Encounter: Admit: 2019-11-24 | Discharge: 2019-11-24 | Payer: MEDICARE

## 2019-11-24 DIAGNOSIS — E7800 Pure hypercholesterolemia, unspecified: Secondary | ICD-10-CM

## 2019-11-24 DIAGNOSIS — I5032 Chronic diastolic (congestive) heart failure: Secondary | ICD-10-CM

## 2019-11-24 DIAGNOSIS — I10 Essential (primary) hypertension: Secondary | ICD-10-CM

## 2019-11-25 ENCOUNTER — Encounter: Admit: 2019-11-25 | Discharge: 2019-11-25 | Payer: MEDICARE

## 2019-11-25 MED ORDER — SPIRONOLACTONE 25 MG PO TAB
25 mg | ORAL_TABLET | Freq: Every day | ORAL | 3 refills | 46.00000 days | Status: DC
Start: 2019-11-25 — End: 2019-11-27

## 2019-11-27 ENCOUNTER — Encounter: Admit: 2019-11-27 | Discharge: 2019-11-27 | Payer: MEDICARE

## 2019-11-27 MED ORDER — SPIRONOLACTONE 25 MG PO TAB
25 mg | ORAL_TABLET | Freq: Every day | ORAL | 3 refills | 90.00000 days | Status: DC
Start: 2019-11-27 — End: 2019-12-03

## 2019-12-03 ENCOUNTER — Encounter: Admit: 2019-12-03 | Discharge: 2019-12-03 | Payer: MEDICARE

## 2019-12-03 MED ORDER — SPIRONOLACTONE 25 MG PO TAB
25 mg | ORAL_TABLET | Freq: Every day | ORAL | 3 refills | 90.00000 days | Status: AC
Start: 2019-12-03 — End: ?

## 2020-01-23 ENCOUNTER — Encounter: Admit: 2020-01-23 | Discharge: 2020-01-23 | Payer: MEDICARE

## 2020-01-23 DIAGNOSIS — I5032 Chronic diastolic (congestive) heart failure: Secondary | ICD-10-CM

## 2020-01-23 DIAGNOSIS — I10 Essential (primary) hypertension: Secondary | ICD-10-CM

## 2020-01-23 DIAGNOSIS — E7800 Pure hypercholesterolemia, unspecified: Secondary | ICD-10-CM

## 2020-01-23 LAB — LIPID PROFILE
Lab: 144
Lab: 155 — ABNORMAL HIGH (ref <150)
Lab: 31
Lab: 36 — ABNORMAL LOW (ref >40)
Lab: 4
Lab: 77

## 2020-01-23 NOTE — Telephone Encounter
-----   Message from Chrissie Noa Feliz Beam) Alger Simons, MD sent at 01/23/2020 12:07 PM CDT -----  LDL is not a whole lot better.  Can we verify that he actually increased his rosuvastatin?    Thanks Adelina Mings!  ----- Message -----  From: Florene Route, RN  Sent: 01/23/2020  11:06 AM CDT  To: Hester Mates, MD, #    Labs for you review and recommendations. Rosuvastatin increased from 20mg  to 40mg  in May. Per May OV note Dr. Sandria Manly set LDL goal range to less than 70. Scheduled to see Dr. Arna Medici 02/05/20. Thanks!

## 2020-01-23 NOTE — Telephone Encounter
Called patient to discuss cholesterol levels and verify rosuvastatin dosing. Patient states that he has been taking 40mg  of rosuvastatin since May like he was instructed in his last OV.     Will route to [facility] and [facility] for any recommendations.

## 2020-01-26 ENCOUNTER — Encounter: Admit: 2020-01-26 | Discharge: 2020-01-26 | Payer: MEDICARE

## 2020-01-26 NOTE — Telephone Encounter
-----   Message from Hester Mates, MD sent at 01/23/2020  4:57 PM CDT -----  To all: Lipid profile looks good.  Please let him know.  Thanks.  SBG  ----- Message -----  From: Lorelee Market, RN  Sent: 01/23/2020   1:39 PM CDT  To: Hester Mates, MD      ----- Message -----  From: Doy Mince, RN  Sent: 01/23/2020  11:38 AM CDT  To: [facility] Nurse Gen Card Team [place]      ----- Message -----  From: Alfredia Client, LPN  Sent: 1/61/0960  10:25 AM CDT  To: [facility] Nurse Lab Results

## 2020-01-26 NOTE — Telephone Encounter
Results and recommendations called to patient lmom requested call back if questions

## 2020-02-05 ENCOUNTER — Encounter: Admit: 2020-02-05 | Discharge: 2020-02-05 | Payer: MEDICARE

## 2020-02-05 DIAGNOSIS — J449 Chronic obstructive pulmonary disease, unspecified: Secondary | ICD-10-CM

## 2020-02-05 DIAGNOSIS — R079 Chest pain, unspecified: Secondary | ICD-10-CM

## 2020-02-05 DIAGNOSIS — E039 Hypothyroidism, unspecified: Secondary | ICD-10-CM

## 2020-02-05 DIAGNOSIS — I2510 Atherosclerotic heart disease of native coronary artery without angina pectoris: Secondary | ICD-10-CM

## 2020-02-05 DIAGNOSIS — I10 Essential (primary) hypertension: Secondary | ICD-10-CM

## 2020-02-05 DIAGNOSIS — I5032 Chronic diastolic (congestive) heart failure: Secondary | ICD-10-CM

## 2020-02-05 DIAGNOSIS — E7800 Pure hypercholesterolemia, unspecified: Secondary | ICD-10-CM

## 2020-02-05 DIAGNOSIS — Z0189 Encounter for other specified special examinations: Secondary | ICD-10-CM

## 2020-02-05 DIAGNOSIS — R9439 Abnormal result of other cardiovascular function study: Secondary | ICD-10-CM

## 2020-02-05 NOTE — Progress Notes
Date of Service: 02/05/2020    Mason Lawson is a 74 y.o. male.       HPI     Mr. Mason Lawson is followed for coronary artery diseases, peripheral arterial disease, hyperlipidemia, aortic stenosis, hypertension and heart failure with preserved ejection fraction.  He was hospitalized in [place] in April 2021 for hypertension with exacerbation of congestive heart failure.  Spironolactone is since been added to his medical regimen his blood pressures have been well controlled usually run less than 130/80 mmHg when checked at home.  He has been taking furosemide from 40 mg daily since that time which has done a very good job controlling his dyspnea, edema and weight.  Recently,?the patient indicates that he has been stable and?he reports no congestive symptoms, palpitations, sensation of sustained forceful heart pounding, lightheadedness or syncope.?The patient reports no myalgias, bleeding abnormalities, neurologic motor abnormalities or difficulty with speech. He denies any?claudication. He has been tolerating the 40 mg dose of rosuvastatin without adverse effects.??Wearing support stockings usually helps him control his edema well.? He does not have a regular exercise routine but is very active with construction work.  After developing Covid in January 2021 he has subsequently received his Covid vaccine.  Historically, Mr. Mason Lawson underwent coronary artery bypass surgery in December 2005. At that time his revascularization consisted of an IMA graft to the LAD with a right internal mammary going to the obtuse marginal and a radial to the posterior descending artery. I saw Mr. Mason Lawson on 09/23/10 after an abnormal stress test was recently obtained. On 09/23/10 he told me that he had infrequent mild angina which he had noted over the past year. It occurred less than once a month and only when he was walking uphill and only when he was walking shortly after a meal. On 09/17/10 he walked 6 blocks in 10 minutes during a parade and had no angina because he had not eaten recently and was not walking uphill. I reviewed his stress test with him and he wanted to proceed with coronary angiography which was performed on 09/26/10. Intervention was not required. On 09/30/10 carvedilol was added to his medical regimen for BP control and he has tolerated this medication without difficulty. On 10/21/10 HCTZ 25 mg with triamterene 37.5 mg daily was also added to his medical regimen for improved BP control. ?The patient says that he had cellulitis in his left lower extremity in September 2015 that resolved with antibiotic therapy. Mr. Mason Lawson?developed dyspnea with exertion and?underwent right and left heart catheterization/coronary angiography on 06/18/2018.?Intervention was not required.He reports that he developed Covid illness on July 07 2019 with mild to moderate symptoms.  Mr. Mason Lawson reports that he had a chest x-ray and there was no evidence for pneumonia.  He was placed on supplemental oxygen which he has used occasionally since developing Covid illness.          Vitals:    02/05/20 0834 02/05/20 0839   BP: 136/72 (!) 142/68   BP Source: Arm, Left Upper Arm, Right Upper   Patient Position: Sitting Sitting   Pulse: 52    SpO2: 94%    Weight: 102.4 kg (225 lb 12.8 oz)    Height: 1.803 m (5' 11)    PainSc: Zero      Body mass index is 31.49 kg/m?[name]     Past Medical History  Patient Active Problem List    Diagnosis Date Noted   ? Acute on chronic heart failure with preserved ejection fraction (HCC) 12/05/2018   ?  Chronic heart failure with preserved ejection fraction (HCC) 12/04/2018     RIGHT HEART CATHETERIZATION:  06/2018 in the setting of severe hypertension  HEMODYNAMICS:   Blood pressure 198/81 with a mean of 130 mmHg. Heart rate 50 beats per minute.   PRESSURES:  1. The right atrial pressure was 4 mmHg. Right ventricular pressure was 64/8 mmHg.  2. The pulmonary artery pressure was 60/70 mmHg with a mean of 32 mmHg. The pulmonary capillary wedge pressure was 17 mmHg. The transpulmonary gradient was 15 mmHg.  3. The diastolic pulmonary gradient was 0 mmHg. The pulmonary vascular resistance was 2 Wood units.   RIGHT SATURATIONS: The right atrial saturation 77%. The pulmonary artery saturation was 75%. The aortic saturation was 96%.   OUTPUTS: The cardiac output by Fick method was 5.8 L/minute with a cardiac index of 2.7 L/minute per sq m. The cardiac index by thermodilution was 7.3 with a cardiac index of 3.4 L/minute per sq m.     ? Nonrheumatic aortic valve stenosis 06/06/2018   ? Chest pain 09/26/2010   ? Abnormal stress test 09/26/2010   ? Murmur 09/23/2010   ? Coronary artery disease      A. Onset mid 2004, progression fall 2005, patient reduces activity at work to avoid symptoms  B. 12/05 Progressive angina, Dr. Duanne Lawson, then 06/29/04 CBP Atch OV, referred for cath          >12/29 Cath 100%RCA, 75%LAD&Cx,50%LM EF 55%          >07/01/05 CABGx3 LIMA-LAD w/ Y-RIMA-OM, Rad-PDA per Mason Lawson  C. 06/29/08 exercise echocardiographic images demonstrated basal septal ischemia, significant hypertensive response to exercise, good and adequate exercise tolerance with no angina.  D. LHC 06/2018 for new heart failure symptoms: LEFT HEART CATHETERIZATION:  HEMODYNAMICS:  The aortic pressure was- 195/63 with a mean of 105 mmHg .  The left ventricular systolic pressure was 210 mmHg.  There was a peak to peak gradient of 15 mmHg.  1. Left main coronary artery:  Left main coronary artery arises normally from the left coronary sinus.  The left main coronary artery bifurcates into left anterior descending and left circumflex artery and is free of angiographically significant disease.  2. Left anterior descending:  Left anterior descending is a large vessel which gives rise to- 3 diagonal vessels. 1 diagonal has a 80% ostial stenosis, the rest of diagonal vessels do not appear to be angiographically significant. Distal LAD has competitive flow from the LIMA.   3. Left circumflex artery:  The left circumflex artery appears to be medium caliber and has a 100% stenosis proximally. The mid-to-distal portion of the vessel fills from bridging collaterals from proximal portion and is diffusely disease. Three obtuse marginal branches arise, which are also diffusely diseased.   4. Right coronary artery:  The right coronary artery is a medium caliber vessel, but has 100% chronic total occlusion after the takeoff of conus branch. The PDA fills retrogradely from the radial graft to supply the PLV and has mild plaquing in the native PDA.    BYPASS GRAFT ANATOMY:  1.The LIMA to LAD was visualized using IM catheter.  The ostium, body was noted to be free of any angiographic disease, but has a 80% stenosis at the anastamosis with diffusely diseased distal LAD. There is a Y jump graft from the LIMA to the OM branch which fills retrogradely.            2. The radial artery graft to the PDA  appears to be without any angiographically significant disease at ostium, body or anastomotic site.   E. Regadenosom MPI 11/2018 TID Ratio:  1.05  (normal <1.36). Summed Stress Score:  3   , Summed Rest Score:  0 Quantitative polar maps show a statistically insignificant amount of inferoapical ischemia. Mild inferobasilar septal hypokinesis.       Left Ventricular Ejection Fraction (post stress, in the resting state) =? 52 %.  Left Ventricular End Diastolic Volume: 161 mL  ?    SUMMARY/OPINION:?? This study is borderline abnormal with limited reversible uptake in the inferior wall.  All segments are viable.  Global left ventricular function is within normal limits. In aggregate the current study is low risk in regards to predicted annual cardiovascular mortality rate.  ?     ? Hypercholesterolemia      A.  02/28/01 total 324 total 249 HDL 43 LDL 231 Pt deferred meds  B. 06/29/04 total 273 trig 193 HDL 36 LDLd206 Zocor begun dc'd by pt d/t statinphobia and cost  C.  2/24 Rx Vytorin 10/80 1/2 tab.     ? Hypothyroidism      A.  2004 presentation with TSH over 200, Rx w l-thyroxine     ? Hypertension      A.  2/06 Altace 5 dc'd w/ dysgeusia, Metoprolol 25 BID begun        ? Tests ordered      A. 07/17/08 carotid artery duplex scan: no significant stenosis           Review of Systems   Constitution: Negative.   HENT: Negative.    Eyes: Negative.    Cardiovascular: Positive for dyspnea on exertion.   Respiratory: Negative.    Endocrine: Negative.    Hematologic/Lymphatic: Negative.    Skin: Negative.    Musculoskeletal: Negative.    Gastrointestinal: Negative.    Genitourinary: Negative.    Neurological: Negative.    Psychiatric/Behavioral: Negative.    Allergic/Immunologic: Negative.        Physical Exam  GENERAL: The patient is well developed, well nourished, resting comfortably and in no distress. ?  HEENT: No abnormalities of the visible oro-nasopharynx, conjunctiva or sclera are noted. ?  NECK: There is no jugular venous distension. Carotids are palpable and without bruits. There is no thyroid enlargement. ?  Chest: Lung fields are clear to auscultation. There are no wheezes or crackles. ?  CV: There is a regular rhythm. The first and second heart sounds are normal. A grade 3/6 systolic murmur is heard, loudest in the right upper sternal area. There are no murmurs, gallops or rubs.??I checked his apical heart rate and?it?was?60?bpm.  ABD: The abdomen is soft and supple with normal bowel sounds. There is no hepatosplenomegaly, ascites, tenderness, masses or bruits. ?  Neuro: There are no focal motor defects. Ambulation is normal. Cognitive function appears normal. ?  Ext:?There is no edema or evidence of deep vein thrombosis.??Peripheral pulses are decreased but he has normal capillary refill and no ischemic ulcers. ?  SKIN:?Eczema type rash near left shoulder. ?Limited rash near left front belt line.  PSYCH:?The patient is calm, rationale and oriented.    Cardiovascular Studies  A twelve-lead ECG obtained on August 21, 2019 reveals normal sinus rhythm with a heart rate of 55 bpm.  A nonspecific interventricular conduction delay is noted with a QRS duration of 132 ms.  Echo Doppler 10/08/2019:  1.  Moderate aortic stenosis.  Peak systolic flow velocity=3.2 m/s from apex (not measured  from right parasternal border, suprasternal notch, and subcostal windows).  Mean systolic pressure gradient=23 mm Hg.  Valve area=1.5 cm2 and valve area index=0.7 cm2/m2.  LVOT:AV velocity ratio=0.50.  Stroke volume index=54 ml/m2 (normal).  2.  Normal left ventricular ejection fraction.  Moderate diastolic dysfunction with elevated filling pressure.  3.  No mitral regurgitation.  4.  Unable to measure pulmonary artery pressure.  5.  Normal right ventricular ejection fraction.  No tricuspid regurgitation.  6.  Normal central venous pressure.    Right and left heart catheterization/coronary angiography 06/18/2018:  RIGHT HEART CATHETERIZATION:??  HEMODYNAMICS: ?  4. Blood pressure 198/81 with a mean of 130 mmHg.  5. Heart rate 50 beats per minute.   PRESSURES:  1. The right atrial pressure was 4 mmHg.  2. Right ventricular pressure was 64/8 mmHg.  3. The pulmonary artery pressure was 60/70 mmHg with a mean of 32 mmHg.  4. The pulmonary capillary wedge pressure was 17 mmHg.  5. The transpulmonary gradient was 15 mmHg.  6. The diastolic pulmonary gradient was 0 mmHg.  7. The pulmonary vascular resistance was 2 Wood units.   ?  RIGHT SATURATIONS:  1. The right atrial saturation 77%.  2. The pulmonary artery saturation was 75%.  3. The aortic saturation was 96%.   ?  OUTPUTS:  1. The cardiac output by Fick method was 5.8 L/minute with a cardiac index of 2.7 L/minute per sq m.  2. The cardiac index by thermodilution was 7.3 with a cardiac index of 3.4 L/minute per sq m.  ?  LEFT HEART CATHETERIZATION:??HEMODYNAMICS: ?The aortic pressure was- 195/63 with a mean of?105 mmHg . ?The left ventricular systolic pressure was 210?mmHg. ?There was a peak to peak gradient of 15 mmHg.  ?  SELECTIVE CORONARY ANGIOGRAPHY:??  1. Left main coronary artery: ?Left main coronary artery arises normally from the left coronary sinus. ?The left main coronary artery bifurcates into left anterior descending and left circumflex artery and is free of angiographically significant disease.  2. Left anterior descending: ?Left anterior descending is a large vessel which gives rise to- 3 diagonal vessels.?1 diagonal has a 80% ostial stenosis, the rest of diagonal vessels do not appear to be angiographically significant.?Distal LAD has competitive flow from the LIMA.   3. Left circumflex artery: ?The left circumflex artery appears to be medium caliber and has a 100% stenosis proximally.?The mid-to-distal portion of the vessel fills from bridging collaterals from proximal portion and is diffusely disease. Three obtuse marginal branches arise, which are also diffusely diseased.   4. Right coronary artery: ?The right coronary artery is a medium caliber vessel, but has 100% chronic total occlusion after the takeoff of conus branch.?The PDA fills retrogradely from the radial graft to supply the PLV and has mild plaquing in the native PDA. ?  ?  BYPASS GRAFT ANATOMY:??  1. The LIMA to LAD was visualized using IM catheter. ?The ostium, body was noted to be free of any angiographic disease, but has a 80% stenosis at the anastamosis with diffusely diseased distal LAD. There is a Y jump graft from the LIMA to the OM branch which fills retrogradely.   2. The radial artery graft to the PDA?appears to be without any angiographically significant disease?at ostium, body or anastomotic site.?  ??  IMPRESSION:??  1. Three-vessel coronary artery disease with unchanged bypass graft anatomy?compared to prior cardiac catheterization in 2012.  2. Normal left-sided filling pressures.  3. Normal cardiac output    Problems Addressed Today  Encounter Diagnoses   Name Primary?   ? Chronic heart failure with preserved ejection fraction (HCC) Yes   Aortic stenosis.  Hypertension.  Hypercholesterolemia.    Assessment and Plan     Mr. Baswell's?chronic diastolic heart failure currently appears very well compensated.  It appears that the addition of spironolactone has been helpful in controlling his hypertension and diastolic heart failure. ?He can continue on his current diuretic regimen realizing that he can take additional furosemide 40 mg in the afternoon for 3 days for any fluid retention.??Regular?mild?aerobic exercise, weight loss and adherence to a heart healthy diet were recommended.    He was given a requisition to check his Chem-7.??I have asked him to return for follow-up in approximately?6?months time.  ?         Current Medications (including today's revisions)  ? amLODIPine (NORVASC) 5 mg tablet TAKE 1 TABLET TWICE DAILY   ? aspirin EC 81 mg tablet Take 81 mg by mouth daily.   ? cholecalciferol (VITAMIN D-3) 1,000 units tablet Take 1,000 Units by mouth daily.   ? coenzyme Q10 100 mg cap Take 100 mg by mouth daily.   ? CRANBERRY FRUIT EXTRACT (CRANBERRY PO) Take 4,200 mg by mouth daily.   ? furosemide (LASIX) 40 mg tablet TAKE ONE TABLET DAILY. MAY TAKE AN ADDITIONAL LASIX AS NEEDED (DOSE ADJUSTMENT)   ? GARLIC PO Take 1 tablet by mouth daily.   ? glucosamine/chondro su A/C/Mn (GLUCOSAMINE-CHONDROITIN COMPLX PO) Take 1 tablet by mouth daily.   ? hydrALAZINE (APRESOLINE) 50 mg tablet Take one tablet by mouth twice daily.   ? levothyroxine (SYNTHROID) 175 mcg tablet Take 175 mcg by mouth daily 30 minutes before breakfast.   ? losartan (COZAAR) 100 mg tablet Take one tablet by mouth daily.   ? metFORMIN (GLUCOPHAGE) 500 mg tablet Take one tablet by mouth twice daily with meals. Ok to resume 06/21/18 (Patient taking differently: Take 500 mg by mouth twice daily with meals.)   ? multivitamin (MULTIPLE VITAMIN) PO per tablet Take 1 Tab by mouth Daily.   ? nitroglycerin (NITROSTAT) 0.4 mg tablet 1 tab under your tongue as needed for chest pain may repeat in x 2   ? omeprazole DR(+) (PRILOSEC) 20 mg PO capsule Take 10 mg by mouth daily.   ? rosuvastatin (CRESTOR) 40 mg tablet TAKE 1 TABLET EVERY DAY   ? spironolactone (ALDACTONE) 25 mg tablet Take one tablet by mouth daily. Take with food.   ? vitamins, B complex tab Take 1 tablet by mouth daily.

## 2020-02-05 NOTE — Patient Instructions
Thank you for visiting our office today.    Continue the same medications as you have been doing.          We will be pursuing the following tests after your appointment today:       Orders Placed This Encounter   ? BASIC METABOLIC PANEL         We will plan to see you back in 6 months.  Please call us in the meantime with any questions or concerns.        Please allow 5-7 business days for our providers to review your results. All normal results will go to MyChart. If you do not have Mychart, it is strongly recommended to get this so you can easily view all your results. If you do not have mychart, we will attempt to call you once with normal lab and testing results. If we cannot reach you by phone with normal results, we will send you a letter.  If you have not heard the results of your testing after one week please give [place] a call.       Your Cardiovascular [facility] [place] Team Brett Canales, Pilar Jarvis and [place])  phone number is [phone].

## 2020-03-27 ENCOUNTER — Encounter: Admit: 2020-03-27 | Discharge: 2020-03-27 | Payer: MEDICARE

## 2020-03-27 MED ORDER — LOSARTAN 100 MG PO TAB
ORAL_TABLET | Freq: Every day | 3 refills
Start: 2020-03-27 — End: ?

## 2020-04-09 ENCOUNTER — Encounter: Admit: 2020-04-09 | Discharge: 2020-04-09 | Payer: MEDICARE

## 2020-04-09 MED ORDER — FUROSEMIDE 40 MG PO TAB
ORAL_TABLET | Freq: Every day | ORAL | 3 refills | 90.00000 days | Status: AC | PRN
Start: 2020-04-09 — End: ?

## 2020-05-12 ENCOUNTER — Encounter: Admit: 2020-05-12 | Discharge: 2020-05-12 | Payer: MEDICARE

## 2020-05-12 DIAGNOSIS — I10 Essential (primary) hypertension: Secondary | ICD-10-CM

## 2020-05-12 MED ORDER — AMLODIPINE 5 MG PO TAB
ORAL_TABLET | Freq: Two times a day (BID) | 3 refills | Status: AC
Start: 2020-05-12 — End: ?

## 2020-07-25 ENCOUNTER — Encounter: Admit: 2020-07-25 | Discharge: 2020-07-25 | Payer: MEDICARE

## 2020-07-25 MED ORDER — HYDRALAZINE 50 MG PO TAB
ORAL_TABLET | Freq: Two times a day (BID) | 3 refills
Start: 2020-07-25 — End: ?

## 2020-09-23 ENCOUNTER — Encounter: Admit: 2020-09-23 | Discharge: 2020-09-23 | Payer: MEDICARE

## 2020-09-23 MED ORDER — FUROSEMIDE 40 MG PO TAB
ORAL_TABLET | Freq: Every day | ORAL | 3 refills | 90.00000 days | Status: AC | PRN
Start: 2020-09-23 — End: ?

## 2020-11-04 ENCOUNTER — Encounter: Admit: 2020-11-04 | Discharge: 2020-11-04 | Payer: MEDICARE

## 2020-11-09 ENCOUNTER — Encounter: Admit: 2020-11-09 | Discharge: 2020-11-09 | Payer: MEDICARE

## 2020-11-09 DIAGNOSIS — I10 Essential (primary) hypertension: Secondary | ICD-10-CM

## 2020-11-09 DIAGNOSIS — I2510 Atherosclerotic heart disease of native coronary artery without angina pectoris: Secondary | ICD-10-CM

## 2020-11-09 DIAGNOSIS — J449 Chronic obstructive pulmonary disease, unspecified: Secondary | ICD-10-CM

## 2020-11-09 DIAGNOSIS — E7800 Pure hypercholesterolemia, unspecified: Secondary | ICD-10-CM

## 2020-11-09 DIAGNOSIS — R9439 Abnormal result of other cardiovascular function study: Secondary | ICD-10-CM

## 2020-11-09 DIAGNOSIS — R079 Chest pain, unspecified: Secondary | ICD-10-CM

## 2020-11-09 DIAGNOSIS — I350 Nonrheumatic aortic (valve) stenosis: Secondary | ICD-10-CM

## 2020-11-09 DIAGNOSIS — Z0189 Encounter for other specified special examinations: Secondary | ICD-10-CM

## 2020-11-09 DIAGNOSIS — E039 Hypothyroidism, unspecified: Secondary | ICD-10-CM

## 2020-11-09 MED ORDER — EZETIMIBE 10 MG PO TAB
10 mg | ORAL_TABLET | Freq: Every day | ORAL | 1 refills | Status: AC
Start: 2020-11-09 — End: ?

## 2020-11-09 NOTE — Progress Notes
Date of Service: 11/09/2020    Mason Lawson is a 75 y.o. male.       HPI    Mason Lawson is followed for coronary artery diseases, peripheral arterial disease, hyperlipidemia, aortic stenosis, hypertension and heart failure with preserved ejection fraction.  Symptoms have been very well controlled on his current medical regimen and with of furosemide 40 mg daily.  He has not had to take supplemental furosemide in recent months.   Mason Lawson reports no recent hospitalizations or emergency room visits.  The patient indicates that he has been stable and?he reports no congestive symptoms, palpitations, sensation of sustained forceful heart pounding, lightheadedness or syncope.?The patient reports no myalgias, bleeding abnormalities, claudication neurologic motor abnormalities or difficulty with speech.  He has been tolerating the 40 mg dose of rosuvastatin without adverse effects.??Wearing support stockings usually helps him control his edema well.? 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  After developing Covid in January 2021 he has subsequently received his Covid vaccine.  His weight has been stable.  Historically, Mason Lawson underwent coronary artery bypass surgery in December 2005. At that time his revascularization consisted of an IMA graft to the LAD with a right internal mammary going to the obtuse marginal and a radial to the posterior descending artery. I saw Mason Lawson on 09/23/10 after an abnormal stress test was recently obtained. On 09/23/10 he told me that he had infrequent mild angina which he had noted over the past year. It occurred less than once a month and only when he was walking uphill and only when he was walking shortly after a meal. On 09/17/10 he walked 6 blocks in 10 minutes during a parade and had no angina because he had not eaten recently and was not walking uphill. I reviewed his stress test with him and he wanted to proceed with coronary angiography which was performed on 09/26/10. Intervention was not required. On 09/30/10 carvedilol was added to his medical regimen for BP control and he has tolerated this medication without difficulty. On 10/21/10 HCTZ 25 mg with triamterene 37.5 mg daily was also added to his medical regimen for improved BP control. ?The patient says that he had cellulitis in his left lower extremity in September 2015 that resolved with antibiotic therapy. Mason Lawson?developed dyspnea with exertion and?underwent right and left heart catheterization/coronary angiography on 06/18/2018.?Intervention was not required.He reports that he developed Covid illness on July 07 2019 with mild to moderate symptoms. ?Mason Lawson?reports that he had a chest x-ray and there was no evidence for pneumonia. ?He was placed on supplemental oxygen which he has used occasionally since developing Covid illness. He was hospitalized in [place] in April 2021 for hypertension with exacerbation of congestive heart failure.         Vitals:    11/09/20 0955   BP: (!) 142/70   BP Source: Arm, Left Upper   Pulse: 56   SpO2: 96%   O2 Device: None (Room air)   PainSc: Zero   Weight: 102.5 kg (226 lb)   Height: 180.3 cm (5' 11)     Body mass index is 31.52 kg/m?[name]     Past Medical History  Patient Active Problem List    Diagnosis Date Noted   ? Acute on chronic heart failure with preserved ejection fraction (HCC) 12/05/2018   ? Chronic heart failure with preserved ejection fraction (HCC) 12/04/2018     RIGHT  HEART CATHETERIZATION:  06/2018 in the setting of severe hypertension  HEMODYNAMICS:   Blood pressure 198/81 with a mean of 130 mmHg. Heart rate 50 beats per minute.   PRESSURES:  1. The right atrial pressure was 4 mmHg. Right ventricular pressure was 64/8 mmHg.  2. The pulmonary artery pressure was 60/70 mmHg with a mean of 32 mmHg. The pulmonary capillary wedge pressure was 17 mmHg. The transpulmonary gradient was 15 mmHg.  3. The diastolic pulmonary gradient was 0 mmHg. The pulmonary vascular resistance was 2 Wood units.   RIGHT SATURATIONS: The right atrial saturation 77%. The pulmonary artery saturation was 75%. The aortic saturation was 96%.   OUTPUTS: The cardiac output by Fick method was 5.8 L/minute with a cardiac index of 2.7 L/minute per sq m. The cardiac index by thermodilution was 7.3 with a cardiac index of 3.4 L/minute per sq m.     ? Nonrheumatic aortic valve stenosis 06/06/2018   ? Chest pain 09/26/2010   ? Abnormal stress test 09/26/2010   ? Murmur 09/23/2010   ? Coronary artery disease      A. Onset mid 2004, progression fall 2005, patient reduces activity at work to avoid symptoms  B. 12/05 Progressive angina, Dr. Duanne Limerick, then 06/29/04 CBP Atch OV, referred for cath          >12/29 Cath 100%RCA, 75%LAD&Cx,50%LM EF 55%          >07/01/05 CABGx3 LIMA-LAD w/ Y-RIMA-OM, Rad-PDA per Dr. Ky Barban  C. 06/29/08 exercise echocardiographic images demonstrated basal septal ischemia, significant hypertensive response to exercise, good and adequate exercise tolerance with no angina.  D. LHC 06/2018 for new heart failure symptoms: LEFT HEART CATHETERIZATION:  HEMODYNAMICS:  The aortic pressure was- 195/63 with a mean of 105 mmHg .  The left ventricular systolic pressure was 210 mmHg.  There was a peak to peak gradient of 15 mmHg.  1. Left main coronary artery:  Left main coronary artery arises normally from the left coronary sinus.  The left main coronary artery bifurcates into left anterior descending and left circumflex artery and is free of angiographically significant disease.  2. Left anterior descending:  Left anterior descending is a large vessel which gives rise to- 3 diagonal vessels. 1 diagonal has a 80% ostial stenosis, the rest of diagonal vessels do not appear to be angiographically significant. Distal LAD has competitive flow from the LIMA.   3. Left circumflex artery:  The left circumflex artery appears to be medium caliber and has a 100% stenosis proximally. The mid-to-distal portion of the vessel fills from bridging collaterals from proximal portion and is diffusely disease. Three obtuse marginal branches arise, which are also diffusely diseased.   4. Right coronary artery:  The right coronary artery is a medium caliber vessel, but has 100% chronic total occlusion after the takeoff of conus branch. The PDA fills retrogradely from the radial graft to supply the PLV and has mild plaquing in the native PDA.    BYPASS GRAFT ANATOMY:  1.The LIMA to LAD was visualized using IM catheter.  The ostium, body was noted to be free of any angiographic disease, but has a 80% stenosis at the anastamosis with diffusely diseased distal LAD. There is a Y jump graft from the LIMA to the OM branch which fills retrogradely.            2. The radial artery graft to the PDA appears to be without any angiographically significant disease at ostium, body or anastomotic site.  E. Regadenosom MPI 11/2018 TID Ratio:  1.05  (normal <1.36). Summed Stress Score:  3   , Summed Rest Score:  0 Quantitative polar maps show a statistically insignificant amount of inferoapical ischemia. Mild inferobasilar septal hypokinesis.       Left Ventricular Ejection Fraction (post stress, in the resting state) =? 52 %.  Left Ventricular End Diastolic Volume: 454 mL  ?    SUMMARY/OPINION:?? This study is borderline abnormal with limited reversible uptake in the inferior wall.  All segments are viable.  Global left ventricular function is within normal limits. In aggregate the current study is low risk in regards to predicted annual cardiovascular mortality rate.  ?     ? Hypercholesterolemia      A.  02/28/01 total 324 total 249 HDL 43 LDL 231 Pt deferred meds  B. 06/29/04 total 273 trig 193 HDL 36 LDLd206 Zocor begun dc'd by pt d/t statinphobia and cost  C.  2/24 Rx Vytorin 10/80 1/2 tab.     ? Hypothyroidism      A.  2004 presentation with TSH over 200, Rx w l-thyroxine     ? Hypertension      A.  2/06 Altace 5 dc'd w/ dysgeusia, Metoprolol 25 BID begun        ? Tests ordered      A. 07/17/08 carotid artery duplex scan: no significant stenosis           Review of Systems   Constitutional: Negative.   HENT: Negative.    Eyes: Negative.    Cardiovascular: Negative.    Respiratory: Negative.    Endocrine: Negative.    Hematologic/Lymphatic: Negative.    Skin: Negative.    Musculoskeletal: Negative.    Gastrointestinal: Negative.    Genitourinary: Negative.    Neurological: Negative.    Psychiatric/Behavioral: Negative.    Allergic/Immunologic: Negative.        Physical Exam  GENERAL: The patient is well developed, well nourished, resting comfortably and in no distress. ?  HEENT: No abnormalities of the visible oro-nasopharynx, conjunctiva or sclera are noted. ?  NECK: There is no jugular venous distension. Carotids are palpable and without bruits. There is no thyroid enlargement. ?  Chest: Lung fields are clear to auscultation. There are no wheezes or crackles. ?  CV: There is a regular rhythm. The first and second heart sounds are normal. A grade 3/6 systolic murmur is heard, loudest in the right upper sternal area. There are no murmurs, gallops or rubs.??I checked his apical heart rate and?it?was?56?bpm.  ABD: The abdomen is soft and supple with normal bowel sounds. There is no hepatosplenomegaly, ascites, tenderness, masses or bruits. ?  Neuro: There are no focal motor defects. Ambulation is normal. Cognitive function appears normal. ?  Ext:?There is trace bipedal edema without evidence of deep vein thrombosis.??Peripheral pulses are decreased but he has normal capillary refill and no ischemic ulcers. ?  SKIN:?No rashes or cellulitis.  PSYCH:?The patient is calm, rationale and oriented.    Cardiovascular Studies  A twelve-lead ECG obtained on 11/09/2020 reveals normal sinus rhythm with a heart rate of 55 bpm.  A nonspecific interventricular conduction delay is noted with a QRS duration of 122 ms.  No significant changes noted when compared to the prior echocardiogram obtained on August 21, 2019.    Echo Doppler 10/08/2019:  1. ?Moderate aortic stenosis. ?Peak systolic flow velocity=3.2 m/s from apex (not measured from right parasternal border, suprasternal notch, and subcostal windows). ?Mean systolic pressure gradient=23  mm Hg. ?Valve area=1.5 cm2 and valve area index=0.7 cm2/m2. ?LVOT:AV velocity ratio=0.50. ?Stroke volume index=54 ml/m2 (normal).  2. ?Normal left ventricular ejection fraction. ?Moderate diastolic dysfunction with elevated filling pressure.  3. ?No mitral regurgitation.  4. ?Unable to measure pulmonary artery pressure.  5. ?Normal right ventricular ejection fraction. ?No tricuspid regurgitation.  6. ?Normal central venous pressure.  ?  Right and left heart catheterization/coronary angiography 06/18/2018:  RIGHT HEART CATHETERIZATION:??  HEMODYNAMICS: ?  4. Blood pressure 198/81 with a mean of 130 mmHg.  5. Heart rate 50 beats per minute.   PRESSURES:  1. The right atrial pressure was 4 mmHg.  2. Right ventricular pressure was 64/8 mmHg.  3. The pulmonary artery pressure was 60/70 mmHg with a mean of 32 mmHg.  4. The pulmonary capillary wedge pressure was 17 mmHg.  5. The transpulmonary gradient was 15 mmHg.  6. The diastolic pulmonary gradient was 0 mmHg.  7. The pulmonary vascular resistance was 2 Wood units.   ?  RIGHT SATURATIONS:  1. The right atrial saturation 77%.  2. The pulmonary artery saturation was 75%.  3. The aortic saturation was 96%.   ?  OUTPUTS:  1. The cardiac output by Fick method was 5.8 L/minute with a cardiac index of 2.7 L/minute per sq m.  2. The cardiac index by thermodilution was 7.3 with a cardiac index of 3.4 L/minute per sq m.  ?  LEFT HEART CATHETERIZATION:??HEMODYNAMICS: ?The aortic pressure was- 195/63 with a mean of?105 mmHg . ?The left ventricular systolic pressure was 210?mmHg. ?There was a peak to peak gradient of 15 mmHg.  ?  SELECTIVE CORONARY ANGIOGRAPHY:??  1. Left main coronary artery: ?Left main coronary artery arises normally from the left coronary sinus. ?The left main coronary artery bifurcates into left anterior descending and left circumflex artery and is free of angiographically significant disease.  2. Left anterior descending: ?Left anterior descending is a large vessel which gives rise to- 3 diagonal vessels.?1 diagonal has a 80% ostial stenosis, the rest of diagonal vessels do not appear to be angiographically significant.?Distal LAD has competitive flow from the LIMA.   3. Left circumflex artery: ?The left circumflex artery appears to be medium caliber and has a 100% stenosis proximally.?The mid-to-distal portion of the vessel fills from bridging collaterals from proximal portion and is diffusely disease. Three obtuse marginal branches arise, which are also diffusely diseased.   4. Right coronary artery: ?The right coronary artery is a medium caliber vessel, but has 100% chronic total occlusion after the takeoff of conus branch.?The PDA fills retrogradely from the radial graft to supply the PLV and has mild plaquing in the native PDA. ?  ?  BYPASS GRAFT ANATOMY:??  1. The LIMA to LAD was visualized using IM catheter. ?The ostium, body was noted to be free of any angiographic disease, but has a 80% stenosis at the anastamosis with diffusely diseased distal LAD. There is a Y jump graft from the LIMA to the OM branch which fills retrogradely.   2. The radial artery graft to the PDA?appears to be without any angiographically significant disease?at ostium, body or anastomotic site.?  ??  IMPRESSION:??  1. Three-vessel coronary artery disease with unchanged bypass graft anatomy?compared to prior cardiac catheterization in 2012.  2. Normal left-sided filling pressures.  3. Normal cardiac output      Cardiovascular Health Factors  Vitals BP Readings from Last 3 Encounters:   11/09/20 (!) 142/70   02/05/20 (!) 142/68 11/18/19 126/68  Wt Readings from Last 3 Encounters:   11/09/20 102.5 kg (226 lb)   02/05/20 102.4 kg (225 lb 12.8 oz)   11/18/19 102.5 kg (226 lb)     BMI Readings from Last 3 Encounters:   11/09/20 31.52 kg/m?   02/05/20 31.49 kg/m?   11/18/19 31.52 kg/m?      Smoking Social History     Tobacco Use   Smoking Status Former Smoker   ? Quit date: 06/02/2004   ? Years since quitting: 16.4   Smokeless Tobacco Never Used      Lipid Profile Cholesterol   Date Value Ref Range Status   05/08/2020 151  Final     HDL   Date Value Ref Range Status   05/08/2020 36 (L) >=40 Final     LDL   Date Value Ref Range Status   05/08/2020 79  Final     Triglycerides   Date Value Ref Range Status   05/08/2020 180  Final      Blood Sugar Hemoglobin A1C   Date Value Ref Range Status   04/04/2018 6.2  Final     Glucose   Date Value Ref Range Status   05/08/2020 131 (H) 70 - 105 Final   11/22/2019 163 (H)  Final   10/22/2019 127 (H) 70 - 105 Final   07/04/2004 121 (H) 70 - 110 MG/DL Final   45/40/9811 914 (H) 70 - 110 MG/DL Final   78/29/5621 308 (H) 70 - 110 MG/DL Final     Glucose, POC   Date Value Ref Range Status   06/18/2018 122 (H) 70 - 100 MG/DL Final   65/78/4696 295 (H) 70 - 100 MG/DL Final   28/41/3244 010 (H) 70 - 110 MG/DL Final          Problems Addressed Today  Encounter Diagnoses   Name Primary?   ? Nonrheumatic aortic valve stenosis Yes   Coronary disease.  Hypercholesterolemia.  Hypertension.    Assessment and Plan     Mason Lawson reports that his blood pressure is well controlled when checked outside the office.  He may be having a very slow gradual decrease in exercise tolerance attributable to his aortic valve stenosis.  However he can still swim 3 times a week for half an hour without difficulty.  I have asked him to repeat his echo Doppler study to assess for progression of his aortic valve stenosis.  Alternatives for treatment of hypercholesterolemia were reviewed with the patient and he wanted to add Zetia 10 mg daily to his medical regimen.  Possible adverse effects associated with Zetia were reviewed with the patient and he was asked to report any worrisome symptoms.  It appears that 40 mg of furosemide has been effective in controlling his congestive symptoms.  I have asked him to obtain a Chem-7 to check his electrolytes and renal function.  I have asked the patient to return for follow-up follow-up in approximately 6 months time.         Current Medications (including today's revisions)  ? amLODIPine (NORVASC) 5 mg tablet TAKE 1 TABLET TWICE DAILY   ? aspirin EC 81 mg tablet Take 81 mg by mouth daily.   ? cholecalciferol (VITAMIN D-3) 1,000 units tablet Take 1,000 Units by mouth three times weekly.   ? coenzyme Q10 100 mg cap Take 100 mg by mouth three times weekly.   ? CRANBERRY FRUIT EXTRACT (CRANBERRY PO) Take 4,200 mg by mouth three times weekly.   ?  ferrous sulfate (FEOSOL) 325 mg (65 mg iron) tablet Take 325 mg by mouth three times weekly. Take on an empty stomach at least 1 hour before or 2 hours after food.   ? furosemide (LASIX) 40 mg tablet TAKE ONE TABLET DAILY. MAY TAKE AN ADDITIONAL LASIX AS NEEDED (DOSE ADJUSTMENT)   ? GARLIC PO Take 1 tablet by mouth daily.   ? glucosamine/chondro su A/C/Mn (GLUCOSAMINE-CHONDROITIN COMPLX PO) Take 1 tablet by mouth twice daily.   ? hydrALAZINE (APRESOLINE) 50 mg tablet Take one tablet by mouth twice daily.   ? levothyroxine (SYNTHROID) 175 mcg tablet Take 175 mcg by mouth daily 30 minutes before breakfast.   ? losartan (COZAAR) 100 mg tablet TAKE 1 TABLET EVERY DAY   ? metFORMIN (GLUCOPHAGE) 500 mg tablet Take one tablet by mouth twice daily with meals. Ok to resume 06/21/18 (Patient taking differently: Take 500 mg by mouth twice daily with meals.)   ? multivitamin (MULTIPLE VITAMIN) PO per tablet Take 1 Tab by mouth Daily.   ? nitroglycerin (NITROSTAT) 0.4 mg tablet 1 tab under your tongue as needed for chest pain may repeat in x 2   ? omeprazole DR(+) (PRILOSEC) 20 mg PO capsule Take 10 mg by mouth daily.   ? rosuvastatin (CRESTOR) 40 mg tablet TAKE 1 TABLET EVERY DAY   ? spironolactone (ALDACTONE) 25 mg tablet Take one tablet by mouth daily. Take with food.

## 2020-11-09 NOTE — Patient Instructions
Thank you for visiting our office today.    We would like to make the following medication adjustments:      Start taking Zetia 10 mg daily.       Otherwise continue the same medications as you have been doing.          We will be pursuing the following tests after your appointment today:       Orders Placed This Encounter    BASIC METABOLIC PANEL    ECG Today (all locations)    2D + DOPPLER ECHO    ezetimibe (ZETIA) 10 mg tablet         We will plan to see you back in 6 months.  Please call us in the meantime with any questions or concerns.        Please allow 5-7 business days for our providers to review your results. All normal results will go to MyChart. If you do not have Mychart, it is strongly recommended to get this so you can easily view all your results. If you do not have mychart, we will attempt to call you once with normal lab and testing results. If we cannot reach you by phone with normal results, we will send you a letter.  If you have not heard the results of your testing after one week please give [place] a call.       Your Cardiovascular [facility] [place] Team Brett Canales, Pilar Jarvis and [place])  phone number is [phone].

## 2020-12-17 ENCOUNTER — Encounter: Admit: 2020-12-17 | Discharge: 2020-12-17 | Payer: MEDICARE

## 2020-12-17 ENCOUNTER — Ambulatory Visit: Admit: 2020-12-17 | Discharge: 2020-12-17 | Payer: MEDICARE

## 2020-12-17 DIAGNOSIS — I350 Nonrheumatic aortic (valve) stenosis: Secondary | ICD-10-CM

## 2020-12-17 MED ORDER — SPIRONOLACTONE 25 MG PO TAB
25 mg | ORAL_TABLET | Freq: Every day | ORAL | 3 refills | 90.00000 days | Status: AC
Start: 2020-12-17 — End: ?

## 2020-12-20 ENCOUNTER — Encounter: Admit: 2020-12-20 | Discharge: 2020-12-20 | Payer: MEDICARE

## 2020-12-20 NOTE — Telephone Encounter
-----   Message from Hester Mates, MD sent at 12/17/2020  4:41 PM CDT -----  To all: His echo Doppler from 12/17/2020 shows normal left ventricular systolic function with moderate aortic valve stenosis.  Please let him know.  We will continue to follow.  Thanks.  SBG    ----- Message -----  From: Hester Mates, MD  Sent: 12/17/2020   4:41 PM CDT  To: Hester Mates, MD

## 2020-12-20 NOTE — Telephone Encounter
Results and recommendations called to patient lmom requested call back if questions

## 2021-01-10 ENCOUNTER — Encounter: Admit: 2021-01-10 | Discharge: 2021-01-10 | Payer: MEDICARE

## 2021-01-15 ENCOUNTER — Encounter: Admit: 2021-01-15 | Discharge: 2021-01-15 | Payer: MEDICARE

## 2021-01-15 MED ORDER — LOSARTAN 100 MG PO TAB
ORAL_TABLET | Freq: Every day | 3 refills
Start: 2021-01-15 — End: ?

## 2021-01-19 ENCOUNTER — Inpatient Hospital Stay: Admit: 2021-01-19 | Payer: MEDICARE

## 2021-01-19 ENCOUNTER — Inpatient Hospital Stay: Admit: 2021-01-19 | Discharge: 2021-01-19 | Payer: MEDICARE

## 2021-01-19 ENCOUNTER — Encounter: Admit: 2021-01-19 | Discharge: 2021-01-19 | Payer: MEDICARE

## 2021-01-19 ENCOUNTER — Emergency Department: Admit: 2021-01-19 | Discharge: 2021-01-19 | Payer: MEDICARE

## 2021-01-19 DIAGNOSIS — I4892 Unspecified atrial flutter: Secondary | ICD-10-CM

## 2021-01-19 LAB — PROTIME INR (PT): PROTIME: 12 s (ref 9.5–14.2)

## 2021-01-19 LAB — COMPREHENSIVE METABOLIC PANEL
ALBUMIN: 4.4 g/dL (ref 3.5–5.0)
ALK PHOSPHATASE: 78 U/L (ref 25–110)
ALT: 33 U/L (ref 7–56)
ANION GAP: 14 K/UL — ABNORMAL HIGH (ref 3–12)
AST: 25 U/L (ref 7–40)
BLD UREA NITROGEN: 21 mg/dL (ref 7–25)
CALCIUM: 9.6 mg/dL (ref 8.5–10.6)
CO2: 24 MMOL/L (ref 21–30)
CREATININE: 0.8 mg/dL (ref 0.4–1.24)
EGFR: >60 mL/min (ref >60)
GLUCOSE,PANEL: 101 mg/dL — ABNORMAL HIGH (ref 70–100)
SODIUM: 143 MMOL/L (ref 137–147)
TOTAL BILIRUBIN: 1.2 mg/dL (ref 0.3–1.2)
TOTAL PROTEIN: 7.3 g/dL (ref 6.0–8.0)

## 2021-01-19 LAB — POC TROPONIN
TROPONIN I, POC: 0.1 ng/mL — ABNORMAL HIGH (ref 0.00–0.05)
TROPONIN I, POC: 0.1 ng/mL — ABNORMAL HIGH (ref 0.00–0.05)

## 2021-01-19 LAB — CBC AND DIFF
ABSOLUTE BASO COUNT: 0.1 K/UL (ref 0–0.20)
ABSOLUTE EOS COUNT: 0.1 K/UL (ref 0–0.45)
ABSOLUTE MONO COUNT: 1.3 K/UL — ABNORMAL HIGH (ref 0–0.80)
MDW (MONOCYTE DISTRIBUTION WIDTH): 17 (ref <20.7)
WBC COUNT: 10 K/UL (ref 4.5–11.0)

## 2021-01-19 LAB — PTT (APTT): PTT: 31 s (ref 24.0–36.5)

## 2021-01-19 LAB — BNP POC ER: BNP POC: 687 pg/mL — ABNORMAL HIGH (ref 0–100)

## 2021-01-19 LAB — D-DIMER: D-DIMER: 756 ng{FEU}/mL — ABNORMAL HIGH (ref <500)

## 2021-01-19 MED ORDER — ACETAMINOPHEN 500 MG PO TAB
1000 mg | Freq: Once | ORAL | 0 refills | Status: DC
Start: 2021-01-19 — End: 2021-01-19

## 2021-01-19 MED ORDER — ASPIRIN 81 MG PO TBEC
81 mg | Freq: Every day | ORAL | 0 refills | Status: AC
Start: 2021-01-19 — End: ?
  Administered 2021-01-20 – 2021-01-22 (×3): 81 mg via ORAL

## 2021-01-19 MED ORDER — SENNOSIDES-DOCUSATE SODIUM 8.6-50 MG PO TAB
1 | Freq: Every day | ORAL | 0 refills | Status: AC | PRN
Start: 2021-01-19 — End: ?

## 2021-01-19 MED ORDER — IMS MIXTURE TEMPLATE
175 ug | Freq: Every day | ORAL | 0 refills | Status: AC
Start: 2021-01-19 — End: ?
  Administered 2021-01-20 – 2021-01-22 (×6): 175 ug via ORAL

## 2021-01-19 MED ORDER — ONDANSETRON HCL (PF) 4 MG/2 ML IJ SOLN
4 mg | INTRAVENOUS | 0 refills | Status: AC | PRN
Start: 2021-01-19 — End: ?

## 2021-01-19 MED ORDER — ACETAMINOPHEN 325 MG PO TAB
650 mg | ORAL | 0 refills | Status: AC | PRN
Start: 2021-01-19 — End: ?
  Administered 2021-01-21 – 2021-01-22 (×3): 650 mg via ORAL

## 2021-01-19 MED ORDER — LOSARTAN 50 MG PO TAB
100 mg | Freq: Every day | ORAL | 0 refills | Status: AC
Start: 2021-01-19 — End: ?
  Administered 2021-01-20 – 2021-01-22 (×3): 100 mg via ORAL

## 2021-01-19 MED ORDER — ONDANSETRON 4 MG PO TBDI
4 mg | ORAL | 0 refills | Status: AC | PRN
Start: 2021-01-19 — End: ?

## 2021-01-19 MED ORDER — PANTOPRAZOLE 20 MG PO TBEC
20 mg | Freq: Every day | ORAL | 0 refills | Status: AC
Start: 2021-01-19 — End: ?
  Administered 2021-01-20 – 2021-01-22 (×3): 20 mg via ORAL

## 2021-01-19 MED ORDER — EZETIMIBE 10 MG PO TAB
10 mg | Freq: Every day | ORAL | 0 refills | Status: AC
Start: 2021-01-19 — End: ?
  Administered 2021-01-20 – 2021-01-22 (×3): 10 mg via ORAL

## 2021-01-19 MED ORDER — SPIRONOLACTONE 25 MG PO TAB
25 mg | Freq: Every day | ORAL | 0 refills | Status: AC
Start: 2021-01-19 — End: ?
  Administered 2021-01-20 – 2021-01-22 (×3): 25 mg via ORAL

## 2021-01-19 MED ORDER — POLYETHYLENE GLYCOL 3350 17 GRAM PO PWPK
1 | Freq: Every day | ORAL | 0 refills | Status: AC | PRN
Start: 2021-01-19 — End: ?

## 2021-01-19 MED ORDER — AMLODIPINE 5 MG PO TAB
5 mg | Freq: Two times a day (BID) | ORAL | 0 refills | Status: AC
Start: 2021-01-19 — End: ?
  Administered 2021-01-20 – 2021-01-22 (×6): 5 mg via ORAL

## 2021-01-19 MED ORDER — NITROGLYCERIN 0.4 MG SL SUBL
.4 mg | SUBLINGUAL | 0 refills | Status: AC | PRN
Start: 2021-01-19 — End: ?

## 2021-01-19 MED ORDER — HYDRALAZINE 50 MG PO TAB
50 mg | Freq: Two times a day (BID) | ORAL | 0 refills | Status: AC
Start: 2021-01-19 — End: ?
  Administered 2021-01-20 – 2021-01-22 (×6): 50 mg via ORAL

## 2021-01-19 MED ORDER — MELATONIN 5 MG PO TAB
5 mg | Freq: Every evening | ORAL | 0 refills | Status: AC | PRN
Start: 2021-01-19 — End: ?

## 2021-01-19 MED ORDER — ROSUVASTATIN 20 MG PO TAB
40 mg | Freq: Every day | ORAL | 0 refills | Status: AC
Start: 2021-01-19 — End: ?
  Administered 2021-01-20 – 2021-01-22 (×3): 40 mg via ORAL

## 2021-01-19 NOTE — ED Notes
Dr Domenic Schwab notified of Pt HR of 37.

## 2021-01-19 NOTE — Telephone Encounter
Pt called nursing line today stating he was in [place] ED this AM for shortness of breath. They wanted to transfer him to [place] by ambulance, but he refused. States "they said I needed a pacemaker or something."    Records from [place] obtained. Pt seen for SOB- has elevated troponin's. Discussed going to [place] ED today! Pt states he is going to discuss with his wife and let us know. Pt educated on the risks associated with elevated troponin's, current shortness of breath, risks associated with not being evaluated. Pt states he'll go to the ED.     Report provided to [facility] ED RN. Records from [facility] ED faxed to [place]; including H&P, Lab results, additional testing, and discharge summary including patient leaving AMA.

## 2021-01-20 ENCOUNTER — Inpatient Hospital Stay: Admit: 2021-01-20 | Discharge: 2021-01-20 | Payer: MEDICARE

## 2021-01-20 ENCOUNTER — Encounter: Admit: 2021-01-20 | Discharge: 2021-01-20 | Payer: MEDICARE

## 2021-01-20 DIAGNOSIS — R079 Chest pain, unspecified: Secondary | ICD-10-CM

## 2021-01-20 DIAGNOSIS — R9439 Abnormal result of other cardiovascular function study: Secondary | ICD-10-CM

## 2021-01-20 DIAGNOSIS — I10 Essential (primary) hypertension: Secondary | ICD-10-CM

## 2021-01-20 DIAGNOSIS — I2510 Atherosclerotic heart disease of native coronary artery without angina pectoris: Secondary | ICD-10-CM

## 2021-01-20 DIAGNOSIS — E039 Hypothyroidism, unspecified: Secondary | ICD-10-CM

## 2021-01-20 DIAGNOSIS — Z0189 Encounter for other specified special examinations: Secondary | ICD-10-CM

## 2021-01-20 DIAGNOSIS — J449 Chronic obstructive pulmonary disease, unspecified: Secondary | ICD-10-CM

## 2021-01-20 DIAGNOSIS — E7800 Pure hypercholesterolemia, unspecified: Secondary | ICD-10-CM

## 2021-01-20 MED ORDER — PROPOFOL 10 MG/ML IV EMUL 20 ML (INFUSION)(AM)(OR)
INTRAVENOUS | 0 refills | Status: DC
Start: 2021-01-20 — End: 2021-01-20
  Administered 2021-01-20: 20:00:00 100 ug/kg/min via INTRAVENOUS

## 2021-01-20 MED ORDER — PROPOFOL INJ 10 MG/ML IV VIAL
INTRAVENOUS | 0 refills | Status: DC
Start: 2021-01-20 — End: 2021-01-20
  Administered 2021-01-20: 20:00:00 20 mg via INTRAVENOUS
  Administered 2021-01-20: 20:00:00 40 mg via INTRAVENOUS

## 2021-01-20 MED ORDER — LIDOCAINE (PF) 20 MG/ML (2 %) IJ SOLN
INTRAVENOUS | 0 refills | Status: DC
Start: 2021-01-20 — End: 2021-01-20
  Administered 2021-01-20: 20:00:00 60 mg via INTRAVENOUS

## 2021-01-20 MED ORDER — GLYCOPYRROLATE 0.2 MG/ML IJ SOLN
INTRAVENOUS | 0 refills | Status: DC
Start: 2021-01-20 — End: 2021-01-20
  Administered 2021-01-20 (×2): .2 mg via INTRAVENOUS

## 2021-01-20 MED ORDER — PHENYLEPHRINE HCL IN 0.9% NACL 1MG/10ML INFUSION (AN)(OSM)
INTRAVENOUS | 0 refills | Status: DC
Start: 2021-01-20 — End: 2021-01-20
  Administered 2021-01-20: 20:00:00 50 ug via INTRAVENOUS

## 2021-01-20 MED ADMIN — APIXABAN 5 MG PO TAB [315778]: 5 mg | ORAL | @ 18:00:00 | NDC 00003089431

## 2021-01-20 MED ADMIN — FUROSEMIDE 10 MG/ML IJ SOLN [3291]: 40 mg | INTRAVENOUS | @ 08:00:00 | Stop: 2021-01-20 | NDC 70860030242

## 2021-01-20 MED ADMIN — PERFLUTREN LIPID MICROSPHERES 1.1 MG/ML IV SUSP [79178]: 3 mL | INTRAVENOUS | @ 16:00:00 | Stop: 2021-01-20 | NDC 11994001116

## 2021-01-20 MED ADMIN — MAGNESIUM SULFATE IN D5W 1 GRAM/100 ML IV PGBK [166578]: 1 g | INTRAVENOUS | @ 14:00:00 | Stop: 2021-01-20 | NDC 00338170940

## 2021-01-20 MED ADMIN — SODIUM CHLORIDE 0.9 % IV SOLP [27838]: 1000.000 mL | INTRAVENOUS | @ 20:00:00 | Stop: 2021-01-23 | NDC 00338004904

## 2021-01-20 MED ADMIN — HEPARIN (PORCINE) IN 5 % DEX 20,000 UNIT/500 ML (40 UNIT/ML) IV SOLP [3628]: 1554 [IU]/h | INTRAVENOUS | @ 07:00:00 | Stop: 2021-01-20 | NDC 00264956710

## 2021-01-20 MED ADMIN — POTASSIUM CHLORIDE 20 MEQ PO TBTQ [35943]: 20 meq | ORAL | @ 08:00:00 | Stop: 2021-01-20 | NDC 68084036011

## 2021-01-20 MED ADMIN — FUROSEMIDE 40 MG PO TAB [3295]: 40 mg | ORAL | @ 07:00:00 | Stop: 2021-01-20 | NDC 51079007301

## 2021-01-20 MED ADMIN — POTASSIUM CHLORIDE 20 MEQ PO TBTQ [35943]: 20 meq | ORAL | @ 14:00:00 | Stop: 2021-01-20 | NDC 68084036011

## 2021-01-20 MED ADMIN — MAGNESIUM SULFATE IN D5W 1 GRAM/100 ML IV PGBK [166578]: 1 g | INTRAVENOUS | @ 08:00:00 | Stop: 2021-01-20 | NDC 00338170940

## 2021-01-20 NOTE — ED Provider Notes
Mason Lawson is a 75 y.o. male.    Chief Complaint:  Chief Complaint   Patient presents with   ? Shortness of Breath     SOA since Thursday. Was seen at smaller ER earlier today and sent to [place] for cardiac work up d/t elevated troponin. Denies CP. New O2 requirement- 88% RA       History of Present Illness:  Patient is a pleasant 75 year old male with past medical history of COPD, CAD, hypercholesterolemia, hypertension who presents due to progressing shortness of breath with acute worsening this morning.  He denies chest pain at this time but does note that if he really exerts himself he does have some slight chest discomfort.  He denies palpitations, nausea, vomiting, diarrhea.  He reports that he presents because he went to an outside emergency department who told him he needs to be seen at [facility].  Upon arrival, patient in atrial flutter but notes he feels well.          Review of Systems:  Review of Systems   Constitutional: Negative for fever.   HENT: Negative for sore throat.    Eyes: Negative for visual disturbance.   Respiratory: Positive for shortness of breath.    Cardiovascular: Negative for chest pain.   Gastrointestinal: Negative for abdominal pain.   Genitourinary: Negative for dysuria.   Musculoskeletal: Negative for back pain.   Skin: Negative for rash.   Neurological: Negative for headaches.       Allergies:  Percodan [oxycodone hcl-oxycodone-asa]    Past Medical History:  Medical History:   Diagnosis Date   ? Abnormal stress test 09/26/2010   ? Chest pain 09/26/2010   ? COPD (chronic obstructive pulmonary disease) (HCC)    ? Coronary artery disease 06/23/2009   ? Hypercholesterolemia 06/23/2009   ? Hypertension 06/23/2009   ? Hypothyroidism 06/23/2009   ? Tests ordered 06/23/2009       Past Surgical History:  Surgical History:   Procedure Laterality Date   ? HX CORONARY ARTERY BYPASS GRAFT  2006    x3 vessels   ? ANGIOGRAPHY CORONARY ARTERY WITH RIGHT AND LEFT HEART CATHETERIZATION N/A 06/18/2018    Performed by Nat Math, MD, [facility] CATH LAB   ? POSSIBLE PERCUTANEOUS CORONARY STENT PLACEMENT WITH ANGIOPLASTY N/A 06/18/2018    Performed by Nat Math, MD, [facility] CATH LAB   ? CARDIOVASCULAR STRESS TEST         Pertinent medical/surgical history reviewed    Social History:  Social History     Tobacco Use   ? Smoking status: Former Smoker     Quit date: 06/02/2004     Years since quitting: 16.6   ? Smokeless tobacco: Never Used   Substance Use Topics   ? Alcohol use: Yes     Alcohol/week: 0.0 standard drinks     Types: 4 - 5 Cans of beer per week   ? Drug use: No     Social History     Substance and Sexual Activity   Drug Use No             Family History:  Family History   Problem Relation Age of Onset   ? Hypertension Mother        Vitals:  ED Vitals    Date and Time T BP P RR SPO2P SPO2 User   01/19/21 2240 -- -- 41 20 PER MINUTE 40 93 % MT   01/19/21 2200 --  170/60 36 25 PER MINUTE 35 92 % KA   01/19/21 2102 -- -- 47 15 PER MINUTE 56 94 % MT   01/19/21 2000 -- 139/59 54 15 PER MINUTE 56 92 % MT   01/19/21 1614 36.8 ?C (98.2 ?F) 159/76 56 20 PER MINUTE -- 94 % CC          Physical Exam:  Physical Exam  Vitals and nursing note reviewed.   Constitutional:       Appearance: Normal appearance. He is normal weight.   HENT:      Head: Normocephalic and atraumatic.   Eyes:      Extraocular Movements: Extraocular movements intact.      Conjunctiva/sclera: Conjunctivae normal.   Cardiovascular:      Rate and Rhythm: Normal rate. Rhythm irregular.      Heart sounds: Murmur heard.    Systolic murmur is present with a grade of 2/6.  Pulmonary:      Effort: Pulmonary effort is normal.      Breath sounds: Normal breath sounds.   Abdominal:      General: Bowel sounds are normal. There is no distension.      Palpations: Abdomen is soft.      Tenderness: There is no abdominal tenderness.   Musculoskeletal:         General: Normal range of motion.      Cervical back: Neck supple.   Skin: General: Skin is warm and dry.   Neurological:      Mental Status: He is oriented to person, place, and time. Mental status is at baseline.   Psychiatric:         Mood and Affect: Mood normal.         Behavior: Behavior normal.         Laboratory Results:  Labs Reviewed   CBC AND DIFF - Abnormal       Result Value Ref Range Status    White Blood Cells 10.5  4.5 - 11.0 K/UL Final    RBC 5.13  4.4 - 5.5 M/UL Final    Hemoglobin 15.9  13.5 - 16.5 GM/DL Final    Hematocrit 16.1  40 - 50 % Final    MCV 90.1  80 - 100 FL Final    MCH 31.0  26 - 34 PG Final    MCHC 34.4  32.0 - 36.0 G/DL Final    RDW 09.6  11 - 15 % Final    Platelet Count 196  150 - 400 K/UL Final    MPV 8.1  7 - 11 FL Final    Neutrophils 56  41 - 77 % Final    Lymphocytes 30  24 - 44 % Final    Monocytes 12  4 - 12 % Final    Eosinophils 1  0 - 5 % Final    Basophils 1  0 - 2 % Final    Absolute Neutrophil Count 5.79  1.8 - 7.0 K/UL Final    Absolute Lymph Count 3.20  1.0 - 4.8 K/UL Final    Absolute Monocyte Count 1.31 (*) 0 - 0.80 K/UL Final    Absolute Eosinophil Count 0.12  0 - 0.45 K/UL Final    Absolute Basophil Count 0.13  0 - 0.20 K/UL Final    MDW (Monocyte Distribution Width) 17.7  <20.7 Final   COMPREHENSIVE METABOLIC PANEL - Abnormal    Sodium 143  137 - 147 MMOL/L Final    Potassium 3.9  3.5 - 5.1 MMOL/L Final    Chloride 105  98 - 110 MMOL/L Final    Glucose 101 (*) 70 - 100 MG/DL Final    Blood Urea Nitrogen 21  7 - 25 MG/DL Final    Creatinine 1.61  0.4 - 1.24 MG/DL Final    Calcium 9.6  8.5 - 10.6 MG/DL Final    Total Protein 7.3  6.0 - 8.0 G/DL Final    Total Bilirubin 1.2  0.3 - 1.2 MG/DL Final    Albumin 4.4  3.5 - 5.0 G/DL Final    Alk Phosphatase 78  25 - 110 U/L Final    AST (SGOT) 25  7 - 40 U/L Final    CO2 24  21 - 30 MMOL/L Final    ALT (SGPT) 33  7 - 56 U/L Final    Anion Gap 14 (*) 3 - 12 Final    eGFR >60  >60 mL/min Final   POC TROPONIN - Abnormal    Troponin-I-POC 0.17 (*) 0.00 - 0.05 NG/ML Final   BNP POC ER - Abnormal BNP POC 687.0 (*) 0 - 100 PG/ML Final   D-DIMER - Abnormal    D-Dimer 756 (*) <500 ng/mL FEU Final   POC TROPONIN - Abnormal    Troponin-I-POC 0.14 (*) 0.00 - 0.05 NG/ML Final   PROTIME INR (PT)    Protime 12.1  9.5 - 14.2 SEC Final    INR 1.1  0.8 - 1.2 Final   PTT (APTT)    APTT 31.5  24.0 - 36.5 SEC Final   POC TROPONIN          Radiology Interpretation:    CHEST SINGLE VIEW    (Results Pending)       EKG:    Rate 48  QTC 401  There appears to be a 3-1 AV block.  No noted ST changes.    ED Course:    Patient presents to the emergency department due to worsening shortness of breath.  Patient was seen at the [facility] emergency department and recommended to present to [facility] via medical transport.  However, patient opted for private vehicle transport.  Upon arrival, EKG obtained and patient was noted to be in atrial flutter.  This is new for him as he has not been noted to be in atrial flutter on prior EKGs.  Patient was placed on supplemental oxygen although his nasal cannula was not in place in his nose and he was still saturating appropriately initially troponin obtained and was 0.17 but patient has had elevated troponins in the past.  He denies chest pain.  BNP obtained and was elevated at 687.  Otherwise, CBC and CMP obtained without acute abnormalities.  D-dimer was obtained given shortness of breath and was mildly elevated 756.  The cutoff for age-adjusted D-dimer would be 740 for this patient.  However, given his overall presentation concern for PE was low and CTA was not obtained.  Given anticipated need for antiarrhythmic therapy versus possible pacemaker placement, patient was admitted to the medicine service with plans for further cardiology evaluation.         ED Scoring:                                Coding    Facility Administered Meds:  Medications - No data to display    Active Problem List/Diagnosis Related to Current Presentation: Atrial Flutter  Clinical Impression:  Clinical Impression Atrial flutter, unspecified type [facility])       Disposition/Follow up  ED Disposition     ED Disposition    Admit        No follow-up provider specified.    Medications:  New Prescriptions    No medications on file       Procedure Notes:  Procedures      Attestation / Supervision:        Kizzie Ide, MD

## 2021-01-20 NOTE — Progress Notes
Pt EKG showing Complete Heart Block. BP = 175/63; SpO2 = 93; Temp = 98.23F; RR= 18.; HR = 41 (variable goes into the 30s sometimes). Pt denies chest pain,  shortness of breath or dizziness. Pt on 3L oxygen. Mervyn Skeeters, MD notified.

## 2021-01-20 NOTE — ED Notes
Report taken from Amani, Rn

## 2021-01-20 NOTE — Anesthesia Post-Procedure Evaluation
Post-Anesthesia Evaluation    Name: Mason Lawson      MRN: [id number]     DOB: [birth year]     Age: 75 y.o.     Sex: male   __________________________________________________________________________     Procedure Information     Anesthesia Start Date/Time: 01/20/21 1444    Scheduled Providers: Dan Europe, MD    Procedures:       2D + DOPPLER ECHO      TRANSESOPHAGEAL ECHO    Location: Cardiology:[facility]          Post-Anesthesia Vitals  BP: 110/54 (07/21 1522)  Pulse: 53 (07/21 1522)  Respirations: 16 PER MINUTE (07/21 1512)  SpO2: 94 % (07/21 1522)  O2 Device: Nasal cannula (07/21 1522)  Height: 182.9 cm (6') (07/21 1512)   Vitals Value Taken Time   BP 110/54 01/20/21 1522   Temp     Pulse 53 01/20/21 1522   Respirations 16 PER MINUTE 01/20/21 1512   SpO2 94 % 01/20/21 1522   O2 Device Nasal cannula 01/20/21 1522   ABP     ART BP           Post Anesthesia Evaluation Note    Evaluation location: other  Patient participation: recovered; patient participated in evaluation  Level of consciousness: alert    Pain score: 0  Pain management: adequate    Hydration: normovolemia  Airway patency: adequate    Perioperative Events      Postoperative Status  Cardiovascular status: hemodynamically stable  Respiratory status: spontaneous ventilation        Perioperative Events  There were no known complications for this encounter.

## 2021-01-20 NOTE — Anesthesia Pre-Procedure Evaluation
Anesthesia Pre-Procedure Evaluation    Name: Mason Lawson      MRN: [id number]     DOB: Jan 22, 1946     Age: 75 y.o.     Sex: male   _________________________________________________________________________     Procedure Info:   Procedure Information     Date/Time: 01/20/21 1300    Scheduled providers: Dan Europe, MD    Procedure: TRANSESOPHAGEAL ECHO    Location: Cardiology:[facility]          Physical Assessment  Vital Signs (last filed in past 24 hours):  BP: 174/55 (07/21 1331)  Temp: 36.7 ?C (98.1 ?F) (07/21 1206)  Pulse: 60 (07/21 1331)  Respirations: 18 PER MINUTE (07/21 1331)  SpO2: 95 % (07/21 1331)  O2 Device: Nasal cannula (07/21 1331)  O2 Liter Flow: 2 Lpm (07/21 1331)  Height: 182.9 cm (6' 0.01) (07/21 4540)  Weight: 101.8 kg (224 lb 6.9 oz) (07/21 0717)  SpO2 Pulse: 44 (07/20 2323)      Patient History   Allergies   Allergen Reactions   ? Percodan [Oxycodone Hcl-Oxycodone-Asa] NAUSEA AND VOMITING and UNKNOWN        Current Medications    Medication Directions   amLODIPine (NORVASC) 5 mg tablet TAKE 1 TABLET TWICE DAILY   aspirin EC 81 mg tablet Take 81 mg by mouth daily.   cholecalciferol (VITAMIN D-3) 1,000 units tablet Take 1,000 Units by mouth three times weekly.   coenzyme Q10 100 mg cap Take 100 mg by mouth three times weekly.   CRANBERRY FRUIT EXTRACT (CRANBERRY PO) Take 4,200 mg by mouth three times weekly.   ezetimibe (ZETIA) 10 mg tablet Take one tablet by mouth daily.   ferrous sulfate (FEOSOL) 325 mg (65 mg iron) tablet Take 325 mg by mouth three times weekly. Take on an empty stomach at least 1 hour before or 2 hours after food.   furosemide (LASIX) 40 mg tablet TAKE ONE TABLET DAILY. MAY TAKE AN ADDITIONAL LASIX AS NEEDED (DOSE ADJUSTMENT)   GARLIC PO Take 1 tablet by mouth daily.   glucosamine/chondro su A/C/Mn (GLUCOSAMINE-CHONDROITIN COMPLX PO) Take 1 tablet by mouth twice daily.   hydrALAZINE (APRESOLINE) 50 mg tablet Take one tablet by mouth twice daily. levothyroxine (SYNTHROID) 175 mcg tablet Take 175 mcg by mouth daily 30 minutes before breakfast.   losartan (COZAAR) 100 mg tablet TAKE 1 TABLET EVERY DAY   metFORMIN (GLUCOPHAGE) 500 mg tablet Take one tablet by mouth twice daily with meals. Ok to resume 06/21/18  Patient taking differently: Take 500 mg by mouth twice daily with meals.   multivitamin (MULTIPLE VITAMIN) PO per tablet Take 1 Tab by mouth Daily.   nitroglycerin (NITROSTAT) 0.4 mg tablet 1 tab under your tongue as needed for chest pain may repeat in x 2   omeprazole DR(+) (PRILOSEC) 20 mg PO capsule Take 10 mg by mouth daily.   rosuvastatin (CRESTOR) 40 mg tablet TAKE 1 TABLET EVERY DAY   spironolactone (ALDACTONE) 25 mg tablet TAKE ONE TABLET BY MOUTH DAILY. TAKE WITH FOOD.         Review of Systems/Medical History        PONV Screening: Non-smoker and Postoperative opioids      Airway - negative        Pulmonary       Smoker: quit 2005 37pack years.      COPD      Cardiovascular       Recent diagnostic studies:  echocardiogram          Mild concentric LVH is present.  Normal LV systolic function with an estimated ejection fraction of 65%.  Mildly dilated RV with low normal systolic function  Both atria are moderately dilated  The aortic valve is probably trileaflet, heavily calcified and with reduced excursion.  Visually, the aortic stenosis looks severe.  However the mean gradient is 21 mmHg, peak velocity is 3.3 m/s, and the dimensionless velocity ratio is 0.45 - all suggesting moderate aortic stenosis.  06/2018 Cath  1. Three-vessel coronary artery disease with unchanged bypass graft anatomy compared to prior cardiac catheterization in 2012.  2. Normal left-sided filling pressures.  3. Normal cardiac output.  Cath 06/2018  Mitral annulus is moderately calcified.  The mean diastolic gradient is 4 mmHg.  Mild mitral regurgitation is seen  The IVC is dilated        Exercise tolerance: >4 METS      Hypertension,       Valvular problems/murmurs      Coronary artery disease      Dysrhythmias (A flutter)      CHF      Hyperlipidemia      GI/Hepatic/Renal - negative        Neuro/Psych - negative        Musculoskeletal - negative        Endocrine/Other         Hypothyroidism   Physical Exam    Airway Findings      Mallampati: II      TM distance: >3 FB      Neck ROM: full      Mouth opening: good      Airway patency: adequate    Dental Findings:             Cardiovascular Findings:       Rhythm: regular      Rate: normal      Other findings: murmur    Pulmonary Findings: Negative      Breath sounds clear to auscultation.    Abdominal Findings:       Obese    Neurological Findings: Negative      Alert and oriented x 3    Constitutional findings: Negative      No acute distress      Well-developed      Well-nourished       Diagnostic Tests  Hematology:   Lab Results   Component Value Date    HGB 14.6 01/20/2021    HCT 42.4 01/20/2021    PLTCT 195 01/20/2021    WBC 10.1 01/20/2021    NEUT 56 01/20/2021    ANC 5.63 01/20/2021    ALC 2.97 01/20/2021    MONA 12 01/20/2021    AMC 1.21 01/20/2021    EOSA 2 01/20/2021    ABC 0.11 01/20/2021    MCV 91.7 01/20/2021    MCH 31.5 01/20/2021    MCHC 34.3 01/20/2021    MPV 8.2 01/20/2021    RDW 14.2 01/20/2021         General Chemistry:   Lab Results   Component Value Date    NA 141 01/20/2021    K 3.8 01/20/2021    CL 104 01/20/2021    CO2 26 01/20/2021    GAP 11 01/20/2021    BUN 19 01/20/2021    CR 0.85 01/20/2021    GLU 134 01/20/2021    GLU 121 07/04/2004    CA 8.6  01/20/2021    ALBUMIN 3.9 01/20/2021    OBSCA 1.18 07/02/2004    MG 1.9 01/20/2021    TOTBILI 1.2 01/20/2021    PO4 3.7 01/19/2021      Coagulation:   Lab Results   Component Value Date    PT 13.3 01/20/2021    PTT 36.0 01/20/2021    INR 1.2 01/20/2021   75 years old male with previous history of CAD and HFpEF comes to the ED with shortness of breath and lower legs swellings. Initially presented to the outside hospital and reported receiving IV lasix there. He has not missed any doses of his home Lasix. He is requiring 3 L but usually requiring 2 L at home. XR chest with cardiomegaly/congestion. Elevated BNP and Trop 0.17 and 0.14. He denies any chest pain. His EKG is showing a flutter with no previous diagnosis. He also is bradycardic with HR  down to 30s on tele but with no symptoms. We will start on IV Lasix. Obtain echo and consult cardiology for newly diagnosed A flutter.    ?       Anesthesia Plan    ASA score: 3   Plan: MAC  Special equipment/procedures: TEE  Induction method: intravenous  NPO status: acceptable      Informed Consent  Anesthetic plan and risks discussed with patient.  Use of blood products discussed with patient  Blood Consent: consented      Plan discussed with: anesthesiologist, CRNA and SRNA.

## 2021-01-21 ENCOUNTER — Inpatient Hospital Stay: Admit: 2021-01-21 | Discharge: 2021-01-21 | Payer: MEDICARE

## 2021-01-21 ENCOUNTER — Encounter: Admit: 2021-01-21 | Discharge: 2021-01-21 | Payer: MEDICARE

## 2021-01-21 DIAGNOSIS — I4892 Unspecified atrial flutter: Secondary | ICD-10-CM

## 2021-01-21 MED ADMIN — FENTANYL CITRATE (PF) 50 MCG/ML IJ SOLN [3037]: 25 ug | INTRAVENOUS | @ 23:00:00 | Stop: 2021-01-21 | NDC 00409909425

## 2021-01-21 MED ADMIN — MIDAZOLAM 1 MG/ML IJ SOLN [10607]: 0.5 mg | INTRAVENOUS | @ 22:00:00 | Stop: 2021-01-21 | NDC 00409230516

## 2021-01-21 MED ADMIN — FENTANYL CITRATE (PF) 50 MCG/ML IJ SOLN [3037]: 25 ug | INTRAVENOUS | @ 21:00:00 | Stop: 2021-01-21 | NDC 00409909425

## 2021-01-21 MED ADMIN — MIDAZOLAM 1 MG/ML IJ SOLN [10607]: 1 mg | INTRAVENOUS | @ 20:00:00 | Stop: 2021-01-21 | NDC 00409230516

## 2021-01-21 MED ADMIN — CEFAZOLIN INJ 1GM IVP [210319]: 2 g | INTRAVENOUS | @ 21:00:00 | Stop: 2021-01-21 | NDC 00409080501

## 2021-01-21 MED ADMIN — MIDAZOLAM 1 MG/ML IJ SOLN [10607]: 0.5 mg | INTRAVENOUS | @ 20:00:00 | Stop: 2021-01-21 | NDC 00409230516

## 2021-01-21 MED ADMIN — MIDAZOLAM 1 MG/ML IJ SOLN [10607]: 0.5 mg | INTRAVENOUS | @ 21:00:00 | Stop: 2021-01-21 | NDC 00409230516

## 2021-01-21 MED ADMIN — SODIUM CHLORIDE 0.9 % IV SOLP [27838]: 1000.000 mL | INTRAVENOUS | @ 17:00:00 | Stop: 2021-01-23 | NDC 00338004904

## 2021-01-21 MED ADMIN — FENTANYL CITRATE (PF) 50 MCG/ML IJ SOLN [3037]: 25 ug | INTRAVENOUS | @ 22:00:00 | Stop: 2021-01-21 | NDC 00409909425

## 2021-01-21 MED ADMIN — APIXABAN 5 MG PO TAB [315778]: 5 mg | ORAL | @ 14:00:00 | NDC 00003089431

## 2021-01-21 MED ADMIN — BUPIVACAINE (PF) 0.25 % (2.5 MG/ML) IJ SOLN [87866]: 15 mL | @ 22:00:00 | Stop: 2021-01-21 | NDC 00409155918

## 2021-01-21 MED ADMIN — MIDAZOLAM 1 MG/ML IJ SOLN [10607]: 0.5 mg | INTRAVENOUS | @ 23:00:00 | Stop: 2021-01-21 | NDC 00409230516

## 2021-01-21 MED ADMIN — SODIUM CHLORIDE 0.9 % IV SOLP [27838]: 250 mL | @ 21:00:00 | Stop: 2021-01-21 | NDC 00264999900

## 2021-01-21 MED ADMIN — FENTANYL CITRATE (PF) 50 MCG/ML IJ SOLN [3037]: 25 ug | INTRAVENOUS | @ 20:00:00 | Stop: 2021-01-21 | NDC 00409909425

## 2021-01-21 MED ADMIN — HEPARIN (PORCINE) 1,000 UNIT/ML IJ SOLN [10176]: 250 mL | @ 21:00:00 | Stop: 2021-01-21 | NDC 63323054001

## 2021-01-21 MED ADMIN — LIDOCAINE (PF) 10 MG/ML (1 %) IJ SOLN [95838]: 15 mL | @ 22:00:00 | Stop: 2021-01-21 | NDC 55150016205

## 2021-01-21 MED ADMIN — CEFAZOLIN 1 GRAM IJ SOLR [1445]: 500 mL | @ 23:00:00 | Stop: 2021-01-21 | NDC 00409080501

## 2021-01-21 MED ADMIN — BUPIVACAINE (PF) 0.25 % (2.5 MG/ML) IJ SOLN [87866]: 20 mL | @ 20:00:00 | Stop: 2021-01-21 | NDC 00409155918

## 2021-01-21 MED ADMIN — SODIUM CHLORIDE 0.9 % IV SOLP [27838]: 500 mL | @ 23:00:00 | Stop: 2021-01-21 | NDC 00338004904

## 2021-01-21 MED ADMIN — ACETAMINOPHEN/LIDOCAINE/ANTACID DS(#) 1:1:3  PO SUSP [210000]: 30 mL | ORAL | @ 17:00:00 | NDC 54029002209

## 2021-01-21 MED ADMIN — APIXABAN 5 MG PO TAB [315778]: 5 mg | ORAL | @ 04:00:00 | NDC 00003089431

## 2021-01-21 MED ADMIN — EUCALYPTUS-MENTHOL MM LOZG [83613]: 1 | ORAL | @ 06:00:00 | NDC 12546062970

## 2021-01-22 ENCOUNTER — Inpatient Hospital Stay: Admit: 2021-01-22 | Discharge: 2021-01-22 | Payer: MEDICARE

## 2021-01-22 ENCOUNTER — Encounter: Admit: 2021-01-22 | Discharge: 2021-01-22 | Payer: MEDICARE

## 2021-01-22 MED ADMIN — APIXABAN 5 MG PO TAB [315778]: 5 mg | ORAL | @ 14:00:00 | Stop: 2021-01-22 | NDC 00003089431

## 2021-01-22 MED ADMIN — APIXABAN 5 MG PO TAB [315778]: 5 mg | ORAL | @ 02:00:00 | NDC 00003089431

## 2021-01-22 MED FILL — APIXABAN 5 MG PO TAB: 5 mg | ORAL | 30 days supply | Qty: 60 | Fill #1 | Status: CP

## 2021-01-24 ENCOUNTER — Encounter: Admit: 2021-01-24 | Discharge: 2021-01-24 | Payer: MEDICARE

## 2021-01-26 ENCOUNTER — Encounter: Admit: 2021-01-26 | Discharge: 2021-01-26 | Payer: MEDICARE

## 2021-02-01 ENCOUNTER — Encounter: Admit: 2021-02-01 | Discharge: 2021-02-01 | Payer: MEDICARE

## 2021-02-01 NOTE — Progress Notes
(  Left) prepectoral incision is clean, dry, well approximated, and healing without evidence of drainage or discharge. Glue dry and intact. Pt reports no adverse symptoms. Incision care, including signs and symptoms of infection, reviewed(as below). Pt verbalized understanding and will remain in phone contact.    You may shower once dressing removed at follow up appointment; however avoid direct contact with the incision (allow the water to hit the back of your shoulder rather than directly on the incision).    Do not submerge incision in tub, pool, hot tub, or lake for 4 weeks.    Unless your incision is bleeding or draining, keep it open to air.    Avoid applying deodorants, powders, creams, lotions, etc. to your incision for 4 weeks.    Usually there are no stitches to be removed. Glue will begin to fall off in 10-14 days. If they remain after 2 weeks, gently remove them when they are damp after a shower.    Your incision should gradually look better each day. Please notify our office immediately if you notice any of the following:   -an increase in swelling or redness   -any drainage   -increasing pain at the incision site  -fever over 100 degrees

## 2021-02-02 ENCOUNTER — Encounter: Admit: 2021-02-02 | Discharge: 2021-02-02 | Payer: MEDICARE

## 2021-02-02 NOTE — Telephone Encounter
-----   Message from Hester Mates, MD sent at 02/01/2021  5:31 PM CDT -----  Regarding: RE: incision check anticoagulation  To all: Continue therapeutic anticoagulation unless his bleeding risk becomes prohibitive.  Thanks.  SBG  ----- Message -----  From: Weston Brass  Sent: 02/01/2021   2:00 PM CDT  To: Hester Mates, MD  Subject: incision check anticoagulation                   Patient was here for an incision check.  He wanted to know how long he would be on eliquis.  Recent admission for elevated trop.  In flutter upon arrival to [facility].  Flutter ablation.  PPM insertion.  Patient is wondering how long he has to be on anticoagulation.  Currently wearing monitor.  Told him for the foreseeable future.  Depending upon results of monitor    Thanks  Brett Canales

## 2021-02-10 ENCOUNTER — Encounter: Admit: 2021-02-10 | Discharge: 2021-02-10 | Payer: MEDICARE

## 2021-02-10 NOTE — Telephone Encounter
New patient was enrolled into [employer] remote monitoring. New patient welcome letter and agreement were sent. Remote monitoring schedule was entered into patient chart for future device checks. Next remote device check is scheduled for 05/11/21. BC

## 2021-02-21 ENCOUNTER — Encounter: Admit: 2021-02-21 | Discharge: 2021-02-21 | Payer: MEDICARE

## 2021-02-21 DIAGNOSIS — I10 Essential (primary) hypertension: Secondary | ICD-10-CM

## 2021-02-21 DIAGNOSIS — R9439 Abnormal result of other cardiovascular function study: Secondary | ICD-10-CM

## 2021-02-21 DIAGNOSIS — E039 Hypothyroidism, unspecified: Secondary | ICD-10-CM

## 2021-02-21 DIAGNOSIS — R079 Chest pain, unspecified: Secondary | ICD-10-CM

## 2021-02-21 DIAGNOSIS — I2510 Atherosclerotic heart disease of native coronary artery without angina pectoris: Secondary | ICD-10-CM

## 2021-02-21 DIAGNOSIS — Z0189 Encounter for other specified special examinations: Secondary | ICD-10-CM

## 2021-02-21 DIAGNOSIS — J449 Chronic obstructive pulmonary disease, unspecified: Secondary | ICD-10-CM

## 2021-02-21 DIAGNOSIS — E7800 Pure hypercholesterolemia, unspecified: Secondary | ICD-10-CM

## 2021-03-02 ENCOUNTER — Encounter: Admit: 2021-03-02 | Discharge: 2021-03-02 | Payer: MEDICARE

## 2021-03-02 DIAGNOSIS — I10 Essential (primary) hypertension: Secondary | ICD-10-CM

## 2021-03-02 MED ORDER — AMLODIPINE 5 MG PO TAB
ORAL_TABLET | Freq: Two times a day (BID) | 3 refills | Status: AC
Start: 2021-03-02 — End: ?

## 2021-03-23 IMAGING — CR CHEST
2 series · 2 of 2 positions shown · non-contrast
Comparison: none

[chest pa x-wise]
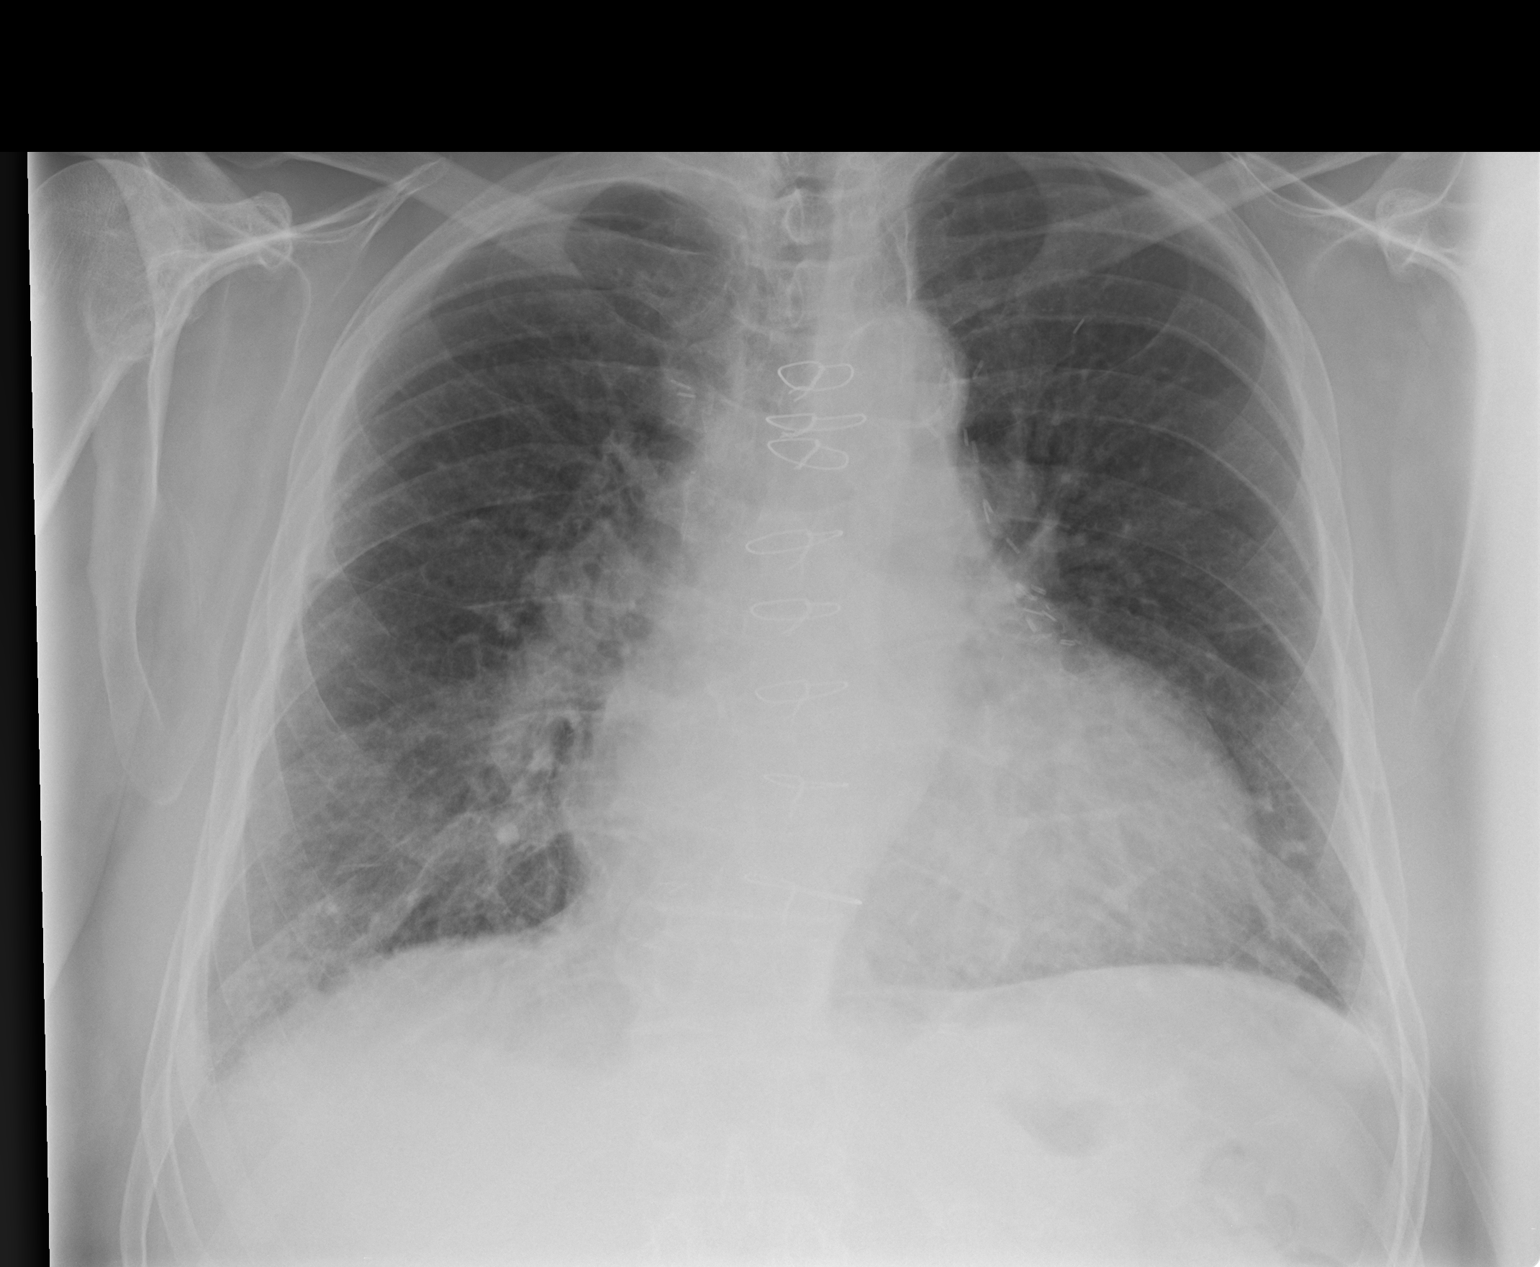

[chest lat]
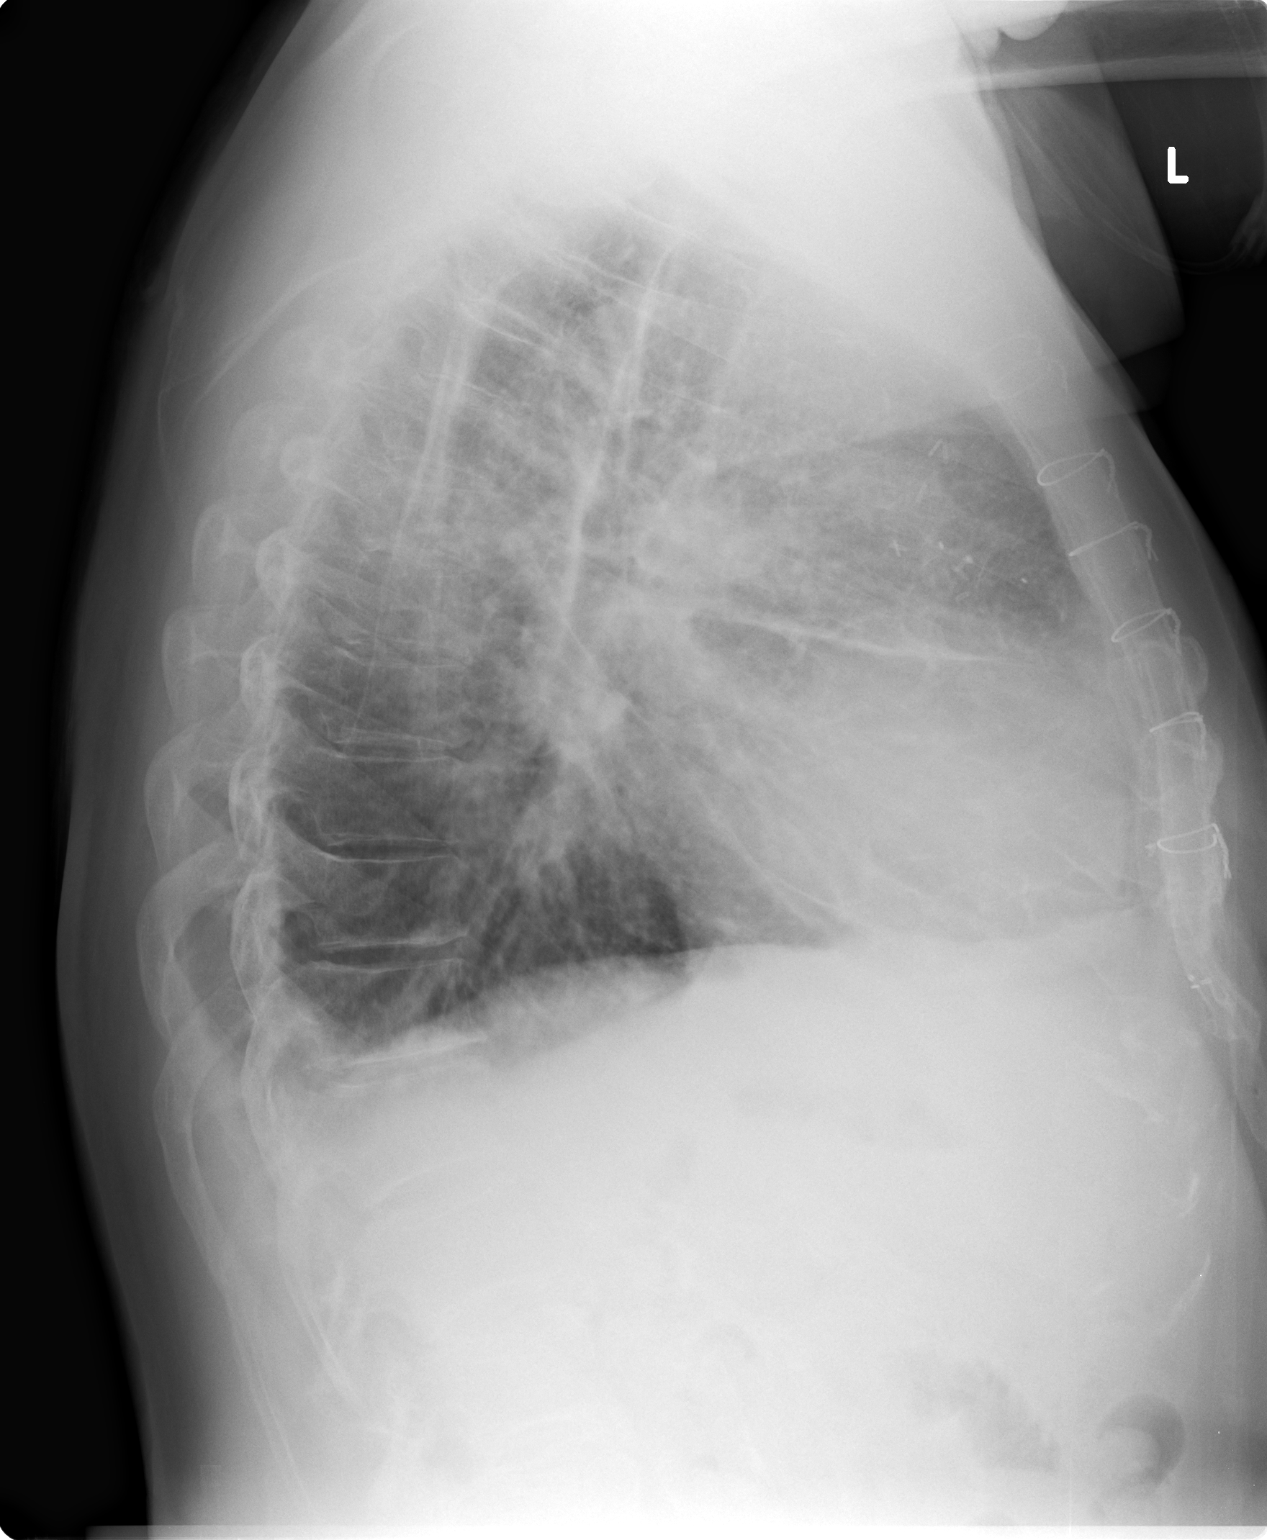

[2 of 2 positions shown; findings below may reference images not displayed]

DIAGNOSTIC STUDIES

EXAM
Chest x-ray

INDICATION
SOB, S/P 3 days left shoulder surgery
XR

TECHNIQUE
PA and lateral views of the chest were obtained.

COMPARISONS
June 05, 2018

FINDINGS
There is cardiomegaly with prior sternotomy and bypass grafting. Pulmonary vascular congestion is
noted consistent with mild congestive failure similar prior exam. Small pleural effusions are noted.

IMPRESSION
Congestive failure without significant change from prior exam. No acute infiltrates are seen.

Tech Notes:

XR

## 2021-03-23 IMAGING — US VDUPLERT
1 series · 14 of 16 positions shown · non-contrast
Comparison: none

[Series 1: us venous duplex low ext right · portal-venous · 14 of 44 slices shown]
[im 1/44]
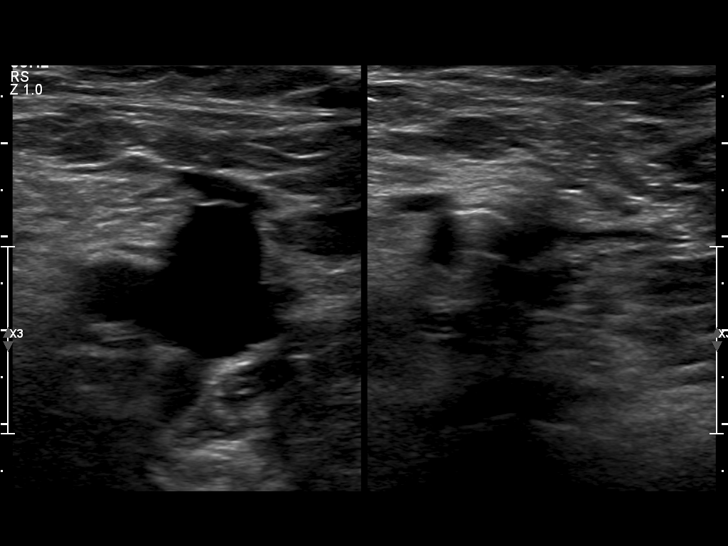
[im 3/44]
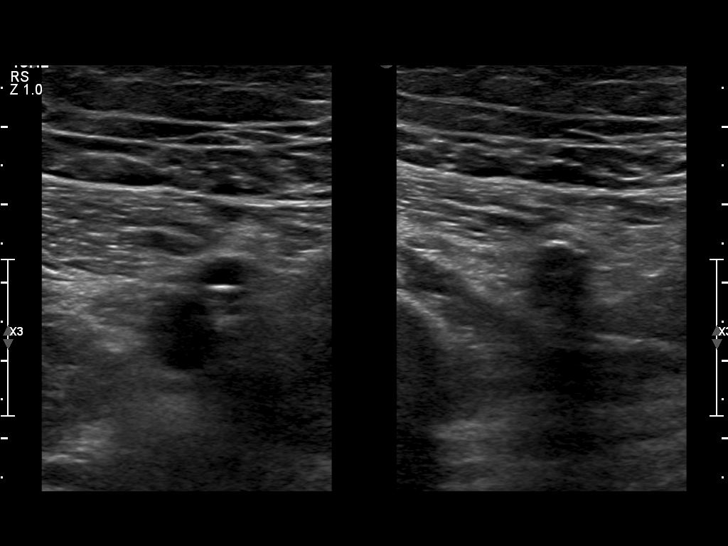
[im 6/44]
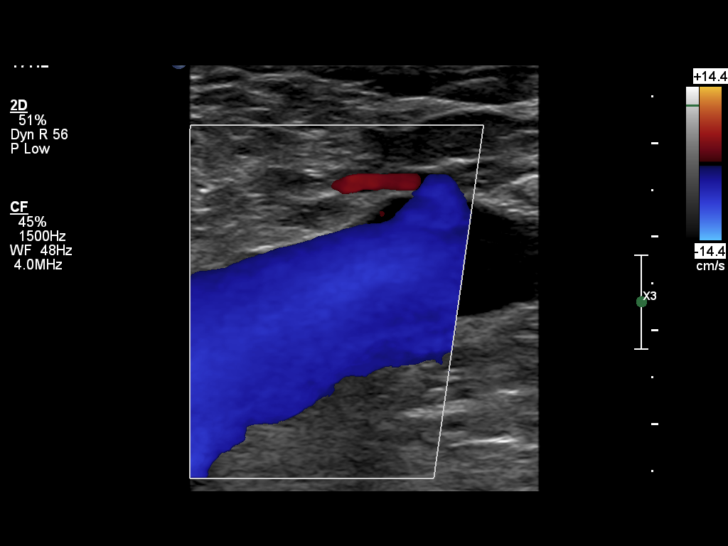
[im 12/44]
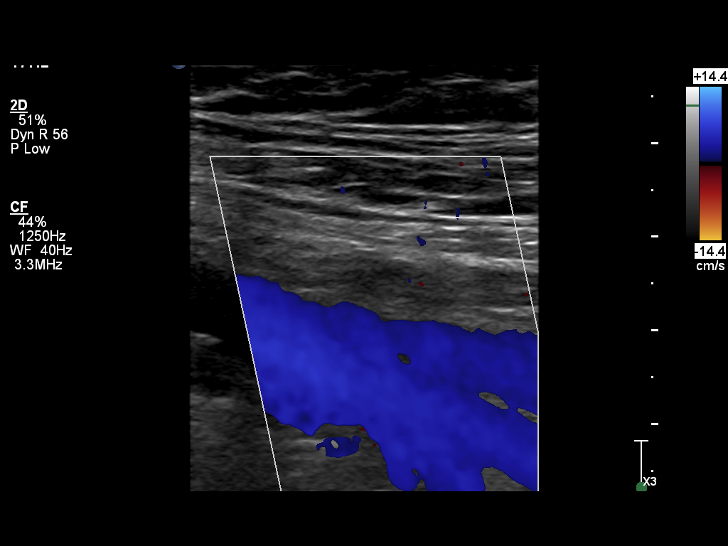
[im 15/44]
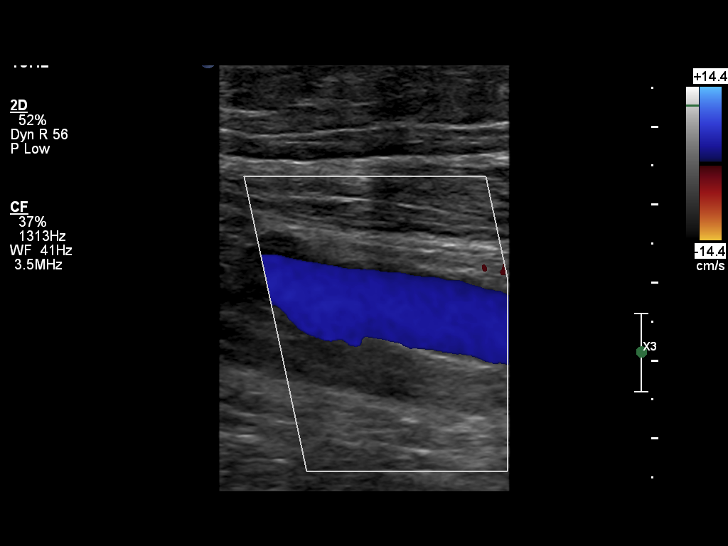
[im 18/44]
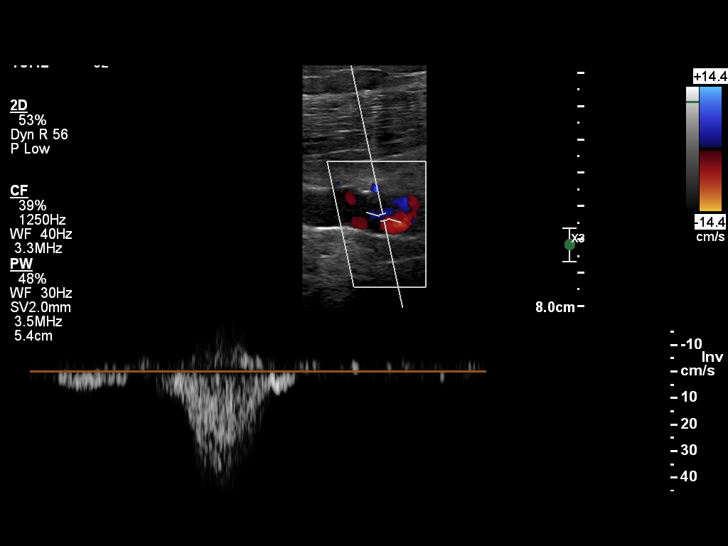
[im 21/44]
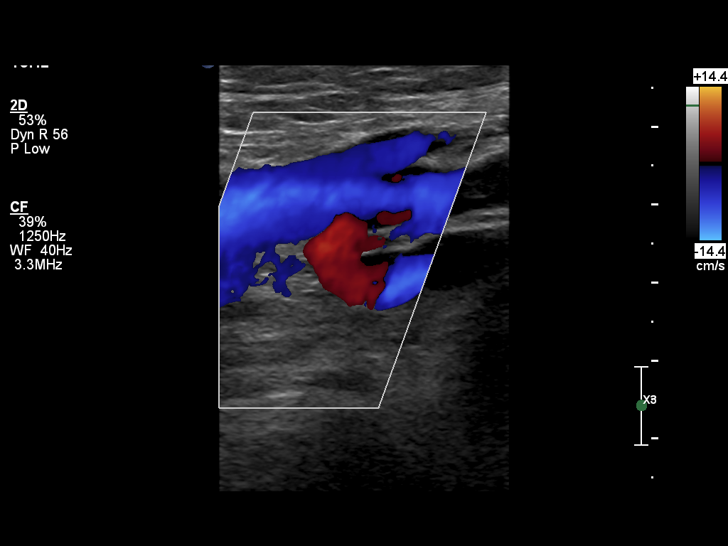
[im 23/44]
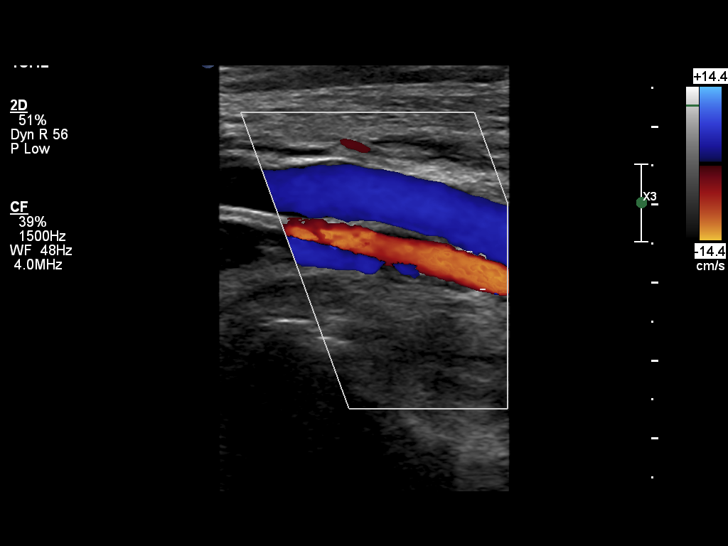
[im 26/44]
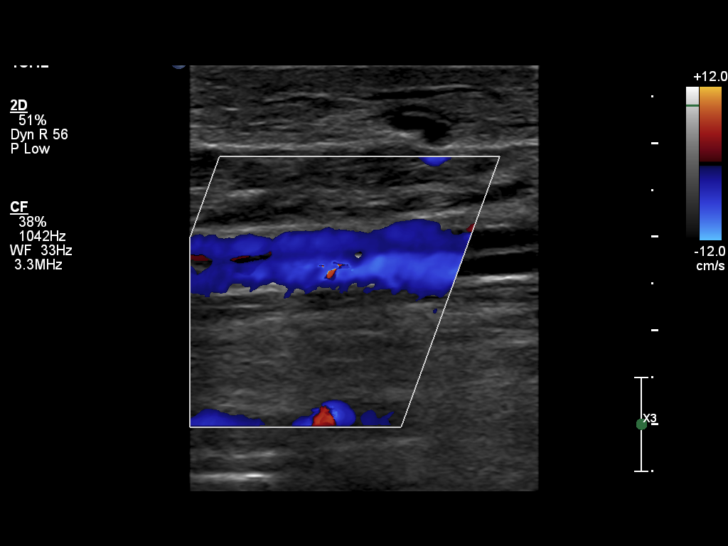
[im 29/44]
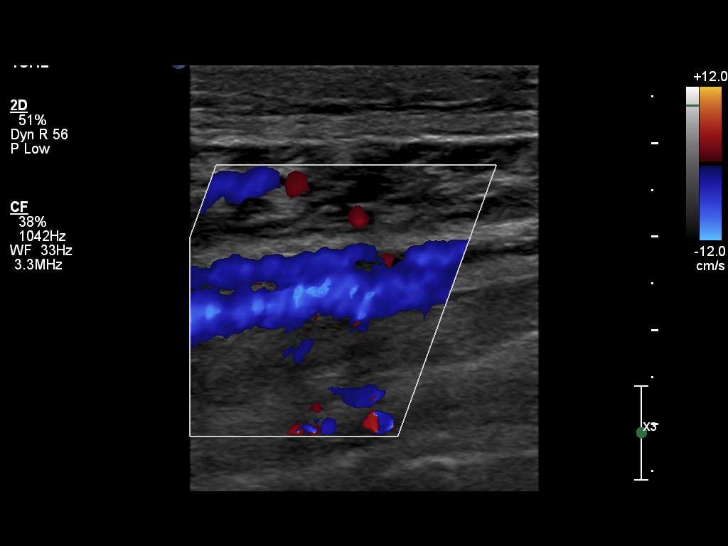
[im 35/44]
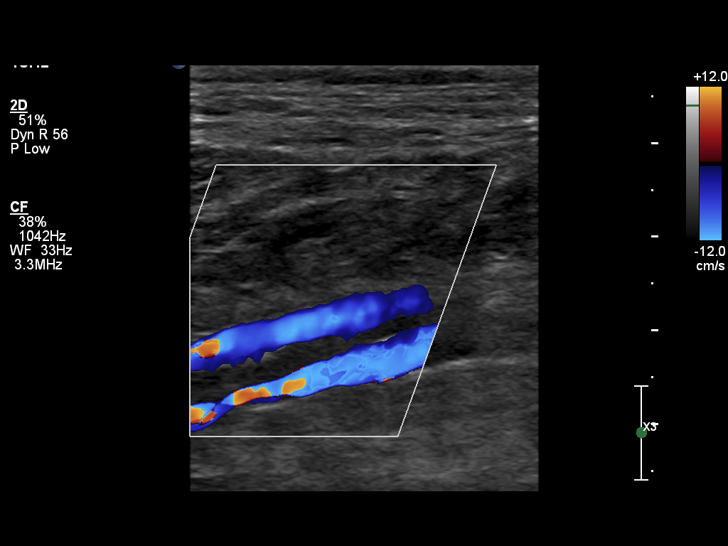
[im 38/44]
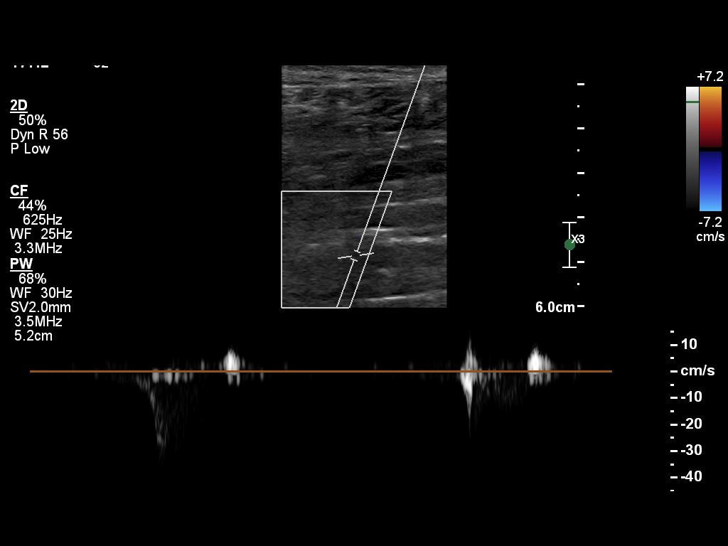
[im 41/44]
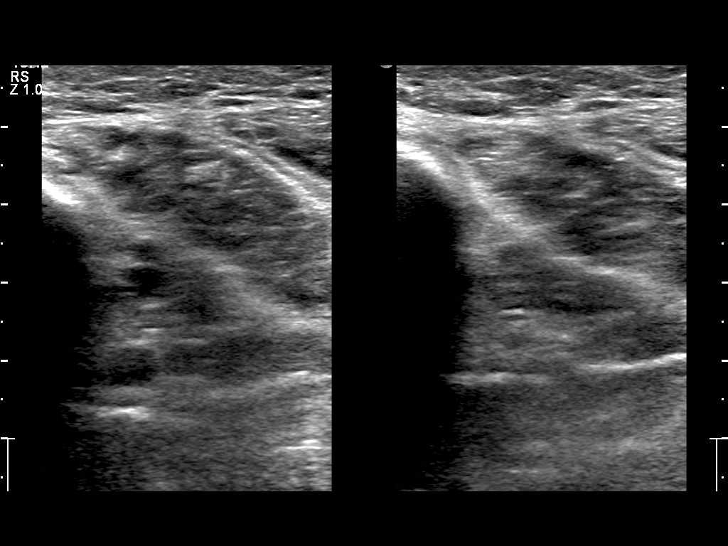
[im 44/44]
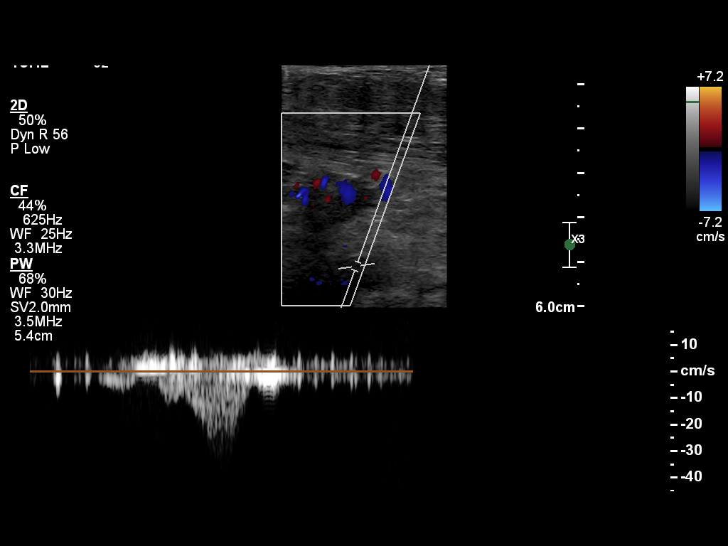

[14 of 16 positions shown; findings below may reference images not displayed]

DIAGNOSTIC STUDIES

EXAM
Right low venous Doppler ultrasound.

INDICATION
Right leg asymmetric swelling s/p 2 days shld surg
rt leg asymmetric swelling; s/p 2 days post of rt shoulder sx; hx of htn, diabetes and prev smoker

TECHNIQUE
High-resolution duplex imaging of the right lower extremity was obtained.

COMPARISONS
None available

FINDINGS
There is normal flow, augmentation, and compressibility throughout the right lower extremity. There
is no evidence of deep venous thrombosis.

IMPRESSION
No evidence of deep venous thrombosis. Report was called to Dr. Levaillant by the technologist.

Tech Notes:

rt leg asymmetric swelling; s/p 2 days post of rt shoulder sx; hx of htn, diabetes and prev smoker

## 2021-04-21 ENCOUNTER — Encounter: Admit: 2021-04-21 | Discharge: 2021-04-21 | Payer: MEDICARE

## 2021-04-21 DIAGNOSIS — I350 Nonrheumatic aortic (valve) stenosis: Secondary | ICD-10-CM

## 2021-04-21 MED ORDER — EZETIMIBE 10 MG PO TAB
ORAL_TABLET | Freq: Every day | 3 refills | Status: AC
Start: 2021-04-21 — End: ?

## 2021-05-06 ENCOUNTER — Encounter: Admit: 2021-05-06 | Discharge: 2021-05-06 | Payer: MEDICARE

## 2021-05-06 NOTE — Telephone Encounter
05-06-21  Previously seen by CVM.  No records requested.  tlv

## 2021-05-13 ENCOUNTER — Encounter: Admit: 2021-05-13 | Discharge: 2021-05-13 | Payer: MEDICARE

## 2021-05-13 DIAGNOSIS — R079 Chest pain, unspecified: Secondary | ICD-10-CM

## 2021-05-13 DIAGNOSIS — I4891 Unspecified atrial fibrillation: Secondary | ICD-10-CM

## 2021-05-13 DIAGNOSIS — E7800 Pure hypercholesterolemia, unspecified: Secondary | ICD-10-CM

## 2021-05-13 DIAGNOSIS — I10 Essential (primary) hypertension: Secondary | ICD-10-CM

## 2021-05-13 DIAGNOSIS — E039 Hypothyroidism, unspecified: Secondary | ICD-10-CM

## 2021-05-13 DIAGNOSIS — Z0189 Encounter for other specified special examinations: Secondary | ICD-10-CM

## 2021-05-13 DIAGNOSIS — I2510 Atherosclerotic heart disease of native coronary artery without angina pectoris: Secondary | ICD-10-CM

## 2021-05-13 DIAGNOSIS — J449 Chronic obstructive pulmonary disease, unspecified: Secondary | ICD-10-CM

## 2021-05-13 DIAGNOSIS — R9439 Abnormal result of other cardiovascular function study: Secondary | ICD-10-CM

## 2021-05-13 NOTE — Telephone Encounter
-----   Message from Hester Mates, MD sent at 05/13/2021  1:04 PM CST -----  Cletis [place]: Please see the recent pacemaker interrogation for this patient.  Since the last time I saw him in clinic, he was admitted to the hospital in July 2022 for bradycardia and atrial flutter.  He was placed on apixaban, cardioverted, and underwent permanent pacemaker insertion.  Please add atrial flutter to his problem list.  He had one short episode of atrial flutter/fibrillation on 04/20/2021 testing 39 minutes and 52 seconds.  His ventricular response rate appeared well controlled at the time.  Please confirm that he is still taking apixaban.  No other immediate action is required.  Thanks.  SBG

## 2021-05-16 ENCOUNTER — Encounter: Admit: 2021-05-16 | Discharge: 2021-05-16 | Payer: MEDICARE

## 2021-05-16 DIAGNOSIS — R079 Chest pain, unspecified: Secondary | ICD-10-CM

## 2021-05-16 DIAGNOSIS — I4891 Unspecified atrial fibrillation: Secondary | ICD-10-CM

## 2021-05-16 DIAGNOSIS — I2510 Atherosclerotic heart disease of native coronary artery without angina pectoris: Secondary | ICD-10-CM

## 2021-05-16 DIAGNOSIS — E039 Hypothyroidism, unspecified: Secondary | ICD-10-CM

## 2021-05-16 DIAGNOSIS — J449 Chronic obstructive pulmonary disease, unspecified: Secondary | ICD-10-CM

## 2021-05-16 DIAGNOSIS — R9439 Abnormal result of other cardiovascular function study: Secondary | ICD-10-CM

## 2021-05-16 DIAGNOSIS — E7800 Pure hypercholesterolemia, unspecified: Secondary | ICD-10-CM

## 2021-05-16 DIAGNOSIS — I10 Essential (primary) hypertension: Secondary | ICD-10-CM

## 2021-05-16 DIAGNOSIS — Z950 Presence of cardiac pacemaker: Secondary | ICD-10-CM

## 2021-05-16 DIAGNOSIS — Z0189 Encounter for other specified special examinations: Secondary | ICD-10-CM

## 2021-05-18 ENCOUNTER — Ambulatory Visit: Admit: 2021-05-18 | Discharge: 2021-05-18 | Payer: MEDICARE

## 2021-05-18 ENCOUNTER — Encounter: Admit: 2021-05-18 | Discharge: 2021-05-18 | Payer: MEDICARE

## 2021-05-18 DIAGNOSIS — Z0189 Encounter for other specified special examinations: Secondary | ICD-10-CM

## 2021-05-18 DIAGNOSIS — I480 Paroxysmal atrial fibrillation: Secondary | ICD-10-CM

## 2021-05-18 DIAGNOSIS — E039 Hypothyroidism, unspecified: Secondary | ICD-10-CM

## 2021-05-18 DIAGNOSIS — R9439 Abnormal result of other cardiovascular function study: Secondary | ICD-10-CM

## 2021-05-18 DIAGNOSIS — Z950 Presence of cardiac pacemaker: Secondary | ICD-10-CM

## 2021-05-18 DIAGNOSIS — Z9889 Other specified postprocedural states: Secondary | ICD-10-CM

## 2021-05-18 DIAGNOSIS — I4892 Unspecified atrial flutter: Secondary | ICD-10-CM

## 2021-05-18 DIAGNOSIS — I10 Essential (primary) hypertension: Secondary | ICD-10-CM

## 2021-05-18 DIAGNOSIS — J449 Chronic obstructive pulmonary disease, unspecified: Secondary | ICD-10-CM

## 2021-05-18 DIAGNOSIS — I4891 Unspecified atrial fibrillation: Secondary | ICD-10-CM

## 2021-05-18 DIAGNOSIS — Z136 Encounter for screening for cardiovascular disorders: Secondary | ICD-10-CM

## 2021-05-18 DIAGNOSIS — I2510 Atherosclerotic heart disease of native coronary artery without angina pectoris: Secondary | ICD-10-CM

## 2021-05-18 DIAGNOSIS — R079 Chest pain, unspecified: Secondary | ICD-10-CM

## 2021-05-18 DIAGNOSIS — E7800 Pure hypercholesterolemia, unspecified: Secondary | ICD-10-CM

## 2021-05-18 MED ORDER — APIXABAN 5 MG PO TAB
5 mg | ORAL_TABLET | Freq: Two times a day (BID) | ORAL | 3 refills | Status: AC
Start: 2021-05-18 — End: ?

## 2021-05-18 MED ORDER — JARDIANCE 10 MG PO TAB
10 mg | ORAL_TABLET | Freq: Every day | ORAL | 3 refills | Status: AC
Start: 2021-05-18 — End: ?

## 2021-05-18 NOTE — Progress Notes
Date of Service: 05/18/2021    Mason Lawson is a 75 y.o. male.       HPI     Mr. Linke is a 75 y.o. man presenting to [facility] in [place] for post hospital follow-up post atrial flutter ablation and pacemaker.  He follows with Dr. Chevis Pretty.  He has coronary artery disease status post CABG in December 2005, peripheral arterial disease, hyperlipidemia, diabetes mellitus type 2, moderate aortic stenosis, hypertension, heart failure with preserved ejection fraction, obesity, obstructive sleep apnea.  He had typical atrial flutter with bradycardic response and was hospitalized in July 2022 and seen by Dr. Bernette Mayers on consult service.  On January 21, 2021 Dr. Wallene Huh did cavotricuspid isthmus ablation.  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 Derrell Lolling implanted dual-chamber pacemaker.  He was started on apixaban 5 mg a mouth twice daily in addition to aspirin 81 mg daily.  He had an episode of chest pain subsequently and decided to reduce apixaban to 2.5 mg twice daily.  Denies any stroke or TIA.  No major bleeding.  No recurrent chest pain.    Sitting comfortably, no distress, excess body mass, lung sounds are clear, sternotomy is healed, left chest pacemaker incision is healed, cardiac auscultation reveals 3/6 ejection systolic murmur.  No pitting edema.    ECG shows atrial ventricular sequential paced rhythm with paced QRS duration 198 ms.  Heart rate 72 beats a minute.    Prior ECG from January 19, 2021 shows typical atrial flutter with bradycardic response 34 bpm, narrow QRS complex, inferior infarct, diffuse T wave inversion.    Device interrogation reveals Medtronic Azure XT DR MRI dual-chamber pacemaker implanted January 21, 2021.  He is programmed DDDR 60-130 bpm.  Stable electrical parameters.  Battery expected 9.8 years.  Appropriate rate histograms.  He is atrially paced 99.8 and ventricularly paced 99.6%.  He had a 40-minute episode of atrial fibrillation on April 20, 2021.  Brief nonsustained VT episodes less than 3 seconds are also noted.    Programming changes made today were to turn on atrial ATP and turn on MVP as he has intrinsic AV conduction.    1. Status post cavotricuspid isthmus ablation January 21, 2021.  2. Paroxysmal atrial fibrillation.  3. 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  4. Chronic RV pacing.  5. Heart failure with preserved ejection fraction.  6. Moderate aortic stenosis.  7. Coronary artery disease status post CABG.  8. Obstructive sleep apnea, on CPAP.  9. Peripheral arterial disease.  10. Diabetes mellitus type 2.  11. CHA2DS2-VASc score 4 (hypertension, diabetes, vascular disease, age).    1. Pacemaker reprogramming as above to avoid unnecessary RV pacing.  2. Increase apixaban back to 5 mg a mouth twice daily for stroke prevention.  Additionally continue aspirin for coronary artery and peripheral arterial disease.  3. Continue remote monitoring on pacemaker.  4. For diabetes and obesity will start empagliflozin 10 mg a mouth once daily.  Caution of risk of urinary infection.  Subsequently can increase dose.  5. He is a candidate for [employer] RCT for Amulet left atrial appendage occluder implant.  We will get him more information.  6. Discussed with him importance of compliance with CPAP treatment.  7. Heart healthy lifestyle with calorie restriction, regular exercise, weight reduction.    Follow-up in 6 months.         Vitals:    05/18/21 1338  BP: 134/80   BP Source: Arm, Left Upper   Pulse: 77   SpO2: 96%   O2 Device: None (Room air)   PainSc: Zero   Weight: 102.1 kg (225 lb)   Height: 180.3 cm (5' 11)     Body mass index is 31.38 kg/m?[name]     Past Medical History  Patient Active Problem List    Diagnosis Date Noted   ? 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 Derrell Lolling     ? Atrial fibrillation (HCC) 05/13/2021   ? Bradycardia 02/01/2021   ? DM (diabetes mellitus) type II, controlled, with peripheral vascular disorder (HCC) 02/01/2021   ? Paroxysmal nocturnal dyspnea 02/01/2021   ? Atrial flutter (HCC) 01/19/2021     01/21/2021 - AFL RFA with Dr. Wallene Huh:  Successful cavo-tricuspid isthmus ablation.  Comprehensive EP study with coronary sinus catheter placement.  Underlying severe sinus node dysfunction. No escape rhythm.     ? Chronic heart failure with preserved ejection fraction (HCC) 12/04/2018     RIGHT HEART CATHETERIZATION:  06/2018 in the setting of severe hypertension  HEMODYNAMICS:   Blood pressure 198/81 with a mean of 130 mmHg. Heart rate 50 beats per minute.   PRESSURES:  1. The right atrial pressure was 4 mmHg. Right ventricular pressure was 64/8 mmHg.  2. The pulmonary artery pressure was 60/70 mmHg with a mean of 32 mmHg. The pulmonary capillary wedge pressure was 17 mmHg. The transpulmonary gradient was 15 mmHg.  3. The diastolic pulmonary gradient was 0 mmHg. The pulmonary vascular resistance was 2 Wood units.   RIGHT SATURATIONS: The right atrial saturation 77%. The pulmonary artery saturation was 75%. The aortic saturation was 96%.   OUTPUTS: The cardiac output by Fick method was 5.8 L/minute with a cardiac index of 2.7 L/minute per sq m. The cardiac index by thermodilution was 7.3 with a cardiac index of 3.4 L/minute per sq m.     ? Nonrheumatic aortic valve stenosis 06/06/2018   ? Dyslipidemia 10/23/2012   ? Chest pain 09/26/2010   ? Abnormal stress test 09/26/2010   ? Murmur 09/23/2010   ? Coronary artery disease      A. Onset mid 2004, progression fall 2005, patient reduces activity at work to avoid symptoms  B. 12/05 Progressive angina, Dr. Duanne Limerick, then 06/29/04 CBP Atch OV, referred for cath          >12/29 Cath 100%RCA, 75%LAD&Cx,50%LM EF 55%          >07/01/05 CABGx3 LIMA-LAD w/ Y-RIMA-OM, Rad-PDA per Dr. Ky Barban  C. 06/29/08 exercise echocardiographic images demonstrated basal septal ischemia, significant hypertensive response to exercise, good and adequate exercise tolerance with no angina.  D. LHC 06/2018 for new heart failure symptoms: LEFT HEART CATHETERIZATION:  HEMODYNAMICS:  The aortic pressure was- 195/63 with a mean of 105 mmHg .  The left ventricular systolic pressure was 210 mmHg.  There was a peak to peak gradient of 15 mmHg.  1. Left main coronary artery:  Left main coronary artery arises normally from the left coronary sinus.  The left main coronary artery bifurcates into left anterior descending and left circumflex artery and is free of angiographically significant disease.  2. Left anterior descending:  Left anterior descending is a large vessel which gives rise to- 3 diagonal vessels. 1 diagonal has a 80% ostial stenosis, the rest of diagonal vessels do not appear to be angiographically significant. Distal LAD has competitive flow from  the LIMA.   3. Left circumflex artery:  The left circumflex artery appears to be medium caliber and has a 100% stenosis proximally. The mid-to-distal portion of the vessel fills from bridging collaterals from proximal portion and is diffusely disease. Three obtuse marginal branches arise, which are also diffusely diseased.   4. Right coronary artery:  The right coronary artery is a medium caliber vessel, but has 100% chronic total occlusion after the takeoff of conus branch. The PDA fills retrogradely from the radial graft to supply the PLV and has mild plaquing in the native PDA.    BYPASS GRAFT ANATOMY:  1.The LIMA to LAD was visualized using IM catheter.  The ostium, body was noted to be free of any angiographic disease, but has a 80% stenosis at the anastamosis with diffusely diseased distal LAD. There is a Y jump graft from the LIMA to the OM branch which fills retrogradely.            2. The radial artery graft to the PDA appears to be without any angiographically significant disease at ostium, body or anastomotic site.   E. Regadenosom MPI 11/2018 TID Ratio:  1.05  (normal <1.36). Summed Stress Score:  3   , Summed Rest Score:  0 Quantitative polar maps show a statistically insignificant amount of inferoapical ischemia. Mild inferobasilar septal hypokinesis.       Left Ventricular Ejection Fraction (post stress, in the resting state) =? 52 %.  Left Ventricular End Diastolic Volume: 161 mL  ?    SUMMARY/OPINION:?? This study is borderline abnormal with limited reversible uptake in the inferior wall.  All segments are viable.  Global left ventricular function is within normal limits. In aggregate the current study is low risk in regards to predicted annual cardiovascular mortality rate.  ?     ? Hypercholesterolemia      A.  02/28/01 total 324 total 249 HDL 43 LDL 231 Pt deferred meds  B. 06/29/04 total 273 trig 193 HDL 36 LDLd206 Zocor begun dc'd by pt d/t statinphobia and cost  C.  2/24 Rx Vytorin 10/80 1/2 tab.     ? Hypothyroidism      A.  2004 presentation with TSH over 200, Rx w l-thyroxine     ? Hypertension      A.  2/06 Altace 5 dc'd w/ dysgeusia, Metoprolol 25 BID begun        ? Tests ordered      A. 07/17/08 carotid artery duplex scan: no significant stenosis         Review of Systems   Constitutional: Negative.   HENT: Negative.    Eyes: Negative.    Cardiovascular: Negative.    Respiratory: Negative.    Endocrine: Negative.    Hematologic/Lymphatic: Negative.    Skin: Negative.    Musculoskeletal: Negative.    Gastrointestinal: Negative.    Genitourinary: Negative.    Neurological: Negative.    Psychiatric/Behavioral: Negative.    Allergic/Immunologic: Negative.        Cardiovascular Health Factors  Vitals BP Readings from Last 3 Encounters:   05/18/21 134/80   01/22/21 (!) 171/73   12/17/20 (!) 152/64     Wt Readings from Last 3 Encounters:   05/18/21 102.1 kg (225 lb)   01/22/21 101.5 kg (223 lb 12.8 oz)   12/17/20 102.5 kg (226 lb)     BMI Readings from Last 3 Encounters:   05/18/21 31.38 kg/m?   01/22/21 30.35 kg/m?   12/17/20 31.52 kg/m?  Smoking Social History Tobacco Use   Smoking Status Former   ? Types: Cigarettes   ? Quit date: 06/02/2004   ? Years since quitting: 16.9   Smokeless Tobacco Never      Lipid Profile Cholesterol   Date Value Ref Range Status   01/19/2021 133 <200 MG/DL Final     HDL   Date Value Ref Range Status   01/19/2021 40 (L) >40 MG/DL Final     LDL   Date Value Ref Range Status   01/19/2021 75 <100 mg/dL Final     Triglycerides   Date Value Ref Range Status   01/19/2021 138 <150 MG/DL Final      Blood Sugar Hemoglobin A1C   Date Value Ref Range Status   04/04/2018 6.2  Final     Glucose   Date Value Ref Range Status   01/22/2021 134 (H) 70 - 100 MG/DL Final   16/04/9603 540 (H) 70 - 100 MG/DL Final   98/05/9146 829 (H) 70 - 100 MG/DL Final   56/21/3086 578 (H) 70 - 110 MG/DL Final   46/96/2952 841 (H) 70 - 110 MG/DL Final   32/44/0102 725 (H) 70 - 110 MG/DL Final     Glucose, POC   Date Value Ref Range Status   06/18/2018 122 (H) 70 - 100 MG/DL Final   36/64/4034 742 (H) 70 - 100 MG/DL Final   59/56/3875 643 (H) 70 - 110 MG/DL Final          I spent over 40 minutes on the date of service related to this patient's care.            Current Medications (including today's revisions)  ? amLODIPine (NORVASC) 5 mg tablet TAKE 1 TABLET TWICE DAILY   ? apixaban (ELIQUIS) 5 mg tablet Take one tablet by mouth twice daily.   ? aspirin EC 81 mg tablet Take 81 mg by mouth daily.   ? cholecalciferol (VITAMIN D-3) 1,000 units tablet Take 1,000 Units by mouth three times weekly.   ? coenzyme Q10 100 mg cap Take 100 mg by mouth three times weekly.   ? CRANBERRY FRUIT EXTRACT (CRANBERRY PO) Take 4,200 mg by mouth three times weekly.   ? empagliflozin (JARDIANCE) 10 mg tablet Take one tablet by mouth daily.   ? ezetimibe (ZETIA) 10 mg tablet TAKE 1 TABLET EVERY DAY   ? ferrous sulfate (FEOSOL) 325 mg (65 mg iron) tablet Take 325 mg by mouth three times weekly. Take on an empty stomach at least 1 hour before or 2 hours after food.   ? furosemide (LASIX) 40 mg tablet TAKE ONE TABLET DAILY. MAY TAKE AN ADDITIONAL LASIX AS NEEDED (DOSE ADJUSTMENT)   ? GARLIC PO Take 1 tablet by mouth daily.   ? glucosamine/chondro su A/C/Mn (GLUCOSAMINE-CHONDROITIN COMPLX PO) Take 1 tablet by mouth twice daily.   ? hydrALAZINE (APRESOLINE) 50 mg tablet Take one tablet by mouth twice daily.   ? ipratropium-albuterol (COMBIVENT RESPIMAT) 20-100 mcg/actuation mist for inhalation Inhale 1 puff by mouth into the lungs four times daily.   ? levothyroxine (SYNTHROID) 175 mcg tablet Take 175 mcg by mouth daily 30 minutes before breakfast.   ? losartan (COZAAR) 100 mg tablet TAKE 1 TABLET EVERY DAY   ? metFORMIN (GLUCOPHAGE) 500 mg tablet Take one tablet by mouth twice daily with meals. Ok to resume 06/21/18 (Patient taking differently: Take 500 mg by mouth twice daily with meals.)   ? nitroglycerin (NITROSTAT) 0.4 mg tablet 1  tab under your tongue as needed for chest pain may repeat in x 2   ? omeprazole DR(+) (PRILOSEC) 20 mg PO capsule Take 20 mg by mouth daily.   ? rosuvastatin (CRESTOR) 40 mg tablet TAKE 1 TABLET EVERY DAY   ? spironolactone (ALDACTONE) 25 mg tablet TAKE ONE TABLET BY MOUTH DAILY. TAKE WITH FOOD.   ? vitamins, multiple tablet Take 1 Tab by mouth Daily.   ? ZINC PO Take 50 mg by mouth.

## 2021-05-23 ENCOUNTER — Encounter: Admit: 2021-05-23 | Discharge: 2021-05-23 | Payer: MEDICARE

## 2021-05-23 DIAGNOSIS — E1151 Type 2 diabetes mellitus with diabetic peripheral angiopathy without gangrene: Secondary | ICD-10-CM

## 2021-05-23 MED ORDER — JARDIANCE 10 MG PO TAB
10 mg | ORAL_TABLET | Freq: Every day | ORAL | 3 refills | Status: AC
Start: 2021-05-23 — End: ?

## 2021-05-23 NOTE — Telephone Encounter
1.  resent Jardiance Rx to [facility] in [place] per request,    2. Discussed lower dose of Eliquis per his request.  States if he is on the riding lawnmower the elquis can make his stomach "hurt".        discussed dosing of Eliquis and with normal kidney function dosing needs to be 5 mg twice daily.   He asked "why do they make the lower dose then?"          Explained decreased dose would be excreted too quickly to be effective.   Relayed that if the Elquis is causing stomach issues Dr Milas Kocher may recommend          another NOAC.    At this time he will continue on the Elquis and monitor her signs and symptoms.

## 2021-05-23 NOTE — Telephone Encounter
-----   Message from [name], LPN sent at 74/94/4967  8:51 AM CST -----  Regarding: ANO- new Rx and med f/u  VM on triage line from patient at 8:36am.  1- he needs Jardiance sent to [facility] as it is cheaper there.  2- ANO wanted him to do full dose of Eliquis but I can only do 1/2 BID.  Call him at #[phone].

## 2021-05-30 ENCOUNTER — Encounter: Admit: 2021-05-30 | Discharge: 2021-05-30 | Payer: MEDICARE

## 2021-05-30 DIAGNOSIS — E1151 Type 2 diabetes mellitus with diabetic peripheral angiopathy without gangrene: Secondary | ICD-10-CM

## 2021-05-30 MED ORDER — JARDIANCE 10 MG PO TAB
10 mg | ORAL_TABLET | Freq: Every day | ORAL | 3 refills | Status: AC
Start: 2021-05-30 — End: ?

## 2021-06-06 ENCOUNTER — Encounter: Admit: 2021-06-06 | Discharge: 2021-06-06 | Payer: MEDICARE

## 2021-06-06 NOTE — Telephone Encounter
-----   Message from Cammy Brochure, RN sent at 06/06/2021  9:41 AM CST -----  Regarding: Remote Results - ANO  Transmission was received for the following reason:     # 1 or more monitored VT episodes detected.    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    Patient has a pacemaker in situ.

## 2021-06-22 ENCOUNTER — Encounter: Admit: 2021-06-22 | Discharge: 2021-06-22 | Payer: MEDICARE

## 2021-06-22 MED ORDER — METOPROLOL SUCCINATE 50 MG PO TB24
50 mg | ORAL_TABLET | Freq: Every day | ORAL | 3 refills | 90.00000 days | Status: AC
Start: 2021-06-22 — End: ?

## 2021-06-22 NOTE — Telephone Encounter
Called pt. Previous script went to [facility]. He states he wants a long-term script to go to [facility].

## 2021-06-22 NOTE — Telephone Encounter
-----   Message from [name], LPN sent at 36/64/4034 12:38 PM CST -----  Regarding: ANO- send in Rx.  VM on triage line from patient at 11:58am.  York Spaniel that he needs Metoprolol ER 50mg  sent to [facility] [phone].  #[phone] that he is out.  Call him at #[phone].

## 2021-07-05 ENCOUNTER — Encounter: Admit: 2021-07-05 | Discharge: 2021-07-05 | Payer: MEDICARE

## 2021-07-05 DIAGNOSIS — I4891 Unspecified atrial fibrillation: Secondary | ICD-10-CM

## 2021-07-05 DIAGNOSIS — I350 Nonrheumatic aortic (valve) stenosis: Secondary | ICD-10-CM

## 2021-07-05 DIAGNOSIS — Z950 Presence of cardiac pacemaker: Secondary | ICD-10-CM

## 2021-07-05 DIAGNOSIS — R0989 Other specified symptoms and signs involving the circulatory and respiratory systems: Secondary | ICD-10-CM

## 2021-07-05 DIAGNOSIS — I10 Essential (primary) hypertension: Secondary | ICD-10-CM

## 2021-07-05 DIAGNOSIS — R9439 Abnormal result of other cardiovascular function study: Secondary | ICD-10-CM

## 2021-07-05 DIAGNOSIS — I2510 Atherosclerotic heart disease of native coronary artery without angina pectoris: Secondary | ICD-10-CM

## 2021-07-05 DIAGNOSIS — Z0189 Encounter for other specified special examinations: Secondary | ICD-10-CM

## 2021-07-05 DIAGNOSIS — E7800 Pure hypercholesterolemia, unspecified: Secondary | ICD-10-CM

## 2021-07-05 DIAGNOSIS — J449 Chronic obstructive pulmonary disease, unspecified: Secondary | ICD-10-CM

## 2021-07-05 DIAGNOSIS — E039 Hypothyroidism, unspecified: Secondary | ICD-10-CM

## 2021-07-05 DIAGNOSIS — I110 Hypertensive heart disease with heart failure: Secondary | ICD-10-CM

## 2021-07-05 DIAGNOSIS — I480 Paroxysmal atrial fibrillation: Secondary | ICD-10-CM

## 2021-07-05 DIAGNOSIS — R079 Chest pain, unspecified: Secondary | ICD-10-CM

## 2021-07-05 MED ORDER — FUROSEMIDE 40 MG PO TAB
40 mg | ORAL_TABLET | ORAL | 3 refills | 90.00000 days | Status: AC
Start: 2021-07-05 — End: ?

## 2021-07-05 NOTE — Patient Instructions
Thank you for visiting our office today.    We would like to make the following medication adjustments:     Take lasix 40mg  every other day. Take on even days.     **Call our nursing line in 1 month to follow up on symptoms after decreasing lasix**      Otherwise continue the same medications as you have been doing.          We will be pursuing the following tests after your appointment today:       Orders Placed This Encounter    ECG 12-LEAD    furosemide (LASIX) 40 mg tablet         We will plan to see you back in 6 months.  Please call in the meantime with any questions or concerns.        Please allow 5-7 business days for our providers to review your results. All normal results will go to MyChart. If you do not have Mychart, it is strongly recommended to get this so you can easily view all your results. If you do not have mychart, we will attempt to call you once with normal lab and testing results. If we cannot reach you by phone with normal results, we will send you a letter.  If you have not heard the results of your testing after one week please give [place] a call.       Your Cardiovascular Medicine [place] Team Gabriel Rung, Brett Canales, Pilar Jarvis, and [place])  phone number is [phone].

## 2021-07-15 ENCOUNTER — Encounter: Admit: 2021-07-15 | Discharge: 2021-07-15 | Payer: MEDICARE

## 2021-07-15 MED ORDER — HYDRALAZINE 50 MG PO TAB
ORAL_TABLET | Freq: Two times a day (BID) | ORAL | 3 refills | 42.50000 days | Status: AC
Start: 2021-07-15 — End: ?

## 2021-07-25 ENCOUNTER — Encounter: Admit: 2021-07-25 | Discharge: 2021-07-25 | Payer: MEDICARE

## 2021-07-25 ENCOUNTER — Ambulatory Visit: Admit: 2021-07-25 | Discharge: 2021-07-25 | Payer: MEDICARE

## 2021-07-25 DIAGNOSIS — I110 Hypertensive heart disease with heart failure: Secondary | ICD-10-CM

## 2021-07-25 DIAGNOSIS — I480 Paroxysmal atrial fibrillation: Secondary | ICD-10-CM

## 2021-07-25 DIAGNOSIS — I350 Nonrheumatic aortic (valve) stenosis: Secondary | ICD-10-CM

## 2021-07-25 DIAGNOSIS — R0989 Other specified symptoms and signs involving the circulatory and respiratory systems: Secondary | ICD-10-CM

## 2021-07-25 MED ORDER — PERFLUTREN LIPID MICROSPHERES 1.1 MG/ML IV SUSP
1-10 mL | Freq: Once | INTRAVENOUS | 0 refills | Status: CP | PRN
Start: 2021-07-25 — End: ?

## 2021-07-25 NOTE — Telephone Encounter
-----   Message from Hester Mates, MD sent at 07/25/2021 12:47 PM CST -----  To all: Echo Doppler study appears stable.  Good left ventricular systolic function with moderate aortic valve stenosis.  We will continue to follow.  Thanks.  SBG  ----- Message -----  From: Hester Mates, MD  Sent: 07/25/2021  12:23 PM CST  To: Hester Mates, MD

## 2021-07-25 NOTE — Telephone Encounter
Results and recommendations called to patient.

## 2021-08-02 ENCOUNTER — Encounter: Admit: 2021-08-02 | Discharge: 2021-08-02 | Payer: MEDICARE

## 2021-08-02 NOTE — Telephone Encounter
Called patient to check symptoms after decreasing Lasix 40mg  to every other day with the addition of Jardiance. Patient states overall he is doing good. He states that his pressures are running around 110s-120s/80s with HR 80-90bpm. He states that his weight has been staying stable around 223-225lbs. He states that he was noticing a little bit of swelling so he states that he is actually taking 40mg  lasix on even days with the addition of 1 odd day each week. He states that this regimen has helped keep his swelling down and improved the side effects of increased thirst with the Jardiance.         Will route to SBG for review and any further recommendations.

## 2021-08-10 ENCOUNTER — Encounter: Admit: 2021-08-10 | Discharge: 2021-08-10 | Payer: MEDICARE

## 2021-08-10 NOTE — Telephone Encounter
-----   Message from Valora PiccoloKyle Larson, RN sent at 08/10/2021  9:50 AM CST -----  Regarding: FW: SBG - NSVT episodes on device transmission    ----- Message -----  From: Lulu RidingArman, Arman  Sent: 08/10/2021   9:42 AM CST  To: [facility] Nurse Gen Card Team [place]  Subject: SBG - NSVT episodes on device transmission       Hi Team,    SBG patient, 7 Non sustained VT events since 06/05/2022, longest 8 sec, fastest V rate 178 bpm,     Patient has a history of NSVT episodes and EP Team D was flagged on 06/06/2021. We like to let you know that patient is still having occasional NSVT episodes.       Thanks,

## 2021-08-10 NOTE — Telephone Encounter
Patient denies any symptoms.  States that he is doing well.

## 2021-10-28 ENCOUNTER — Encounter: Admit: 2021-10-28 | Discharge: 2021-10-28 | Payer: MEDICARE

## 2021-11-02 ENCOUNTER — Encounter: Admit: 2021-11-02 | Discharge: 2021-11-02 | Payer: MEDICARE

## 2021-11-02 DIAGNOSIS — I4891 Unspecified atrial fibrillation: Secondary | ICD-10-CM

## 2021-11-02 DIAGNOSIS — I10 Essential (primary) hypertension: Secondary | ICD-10-CM

## 2021-11-02 DIAGNOSIS — Z950 Presence of cardiac pacemaker: Secondary | ICD-10-CM

## 2021-11-02 DIAGNOSIS — Z0189 Encounter for other specified special examinations: Secondary | ICD-10-CM

## 2021-11-02 DIAGNOSIS — J449 Chronic obstructive pulmonary disease, unspecified: Secondary | ICD-10-CM

## 2021-11-02 DIAGNOSIS — E7800 Pure hypercholesterolemia, unspecified: Secondary | ICD-10-CM

## 2021-11-02 DIAGNOSIS — R079 Chest pain, unspecified: Secondary | ICD-10-CM

## 2021-11-02 DIAGNOSIS — I2510 Atherosclerotic heart disease of native coronary artery without angina pectoris: Secondary | ICD-10-CM

## 2021-11-02 DIAGNOSIS — R9439 Abnormal result of other cardiovascular function study: Secondary | ICD-10-CM

## 2021-11-02 DIAGNOSIS — E039 Hypothyroidism, unspecified: Secondary | ICD-10-CM

## 2021-11-02 MED ORDER — JARDIANCE 25 MG PO TAB
25 mg | ORAL_TABLET | Freq: Every day | ORAL | 3 refills | Status: AC
Start: 2021-11-02 — End: ?

## 2021-11-03 ENCOUNTER — Encounter: Admit: 2021-11-03 | Discharge: 2021-11-03 | Payer: MEDICARE

## 2021-11-03 ENCOUNTER — Inpatient Hospital Stay: Admit: 2021-11-03 | Discharge: 2021-11-03 | Payer: MEDICARE

## 2021-11-03 DIAGNOSIS — I4891 Unspecified atrial fibrillation: Secondary | ICD-10-CM

## 2021-11-03 DIAGNOSIS — I480 Paroxysmal atrial fibrillation: Secondary | ICD-10-CM

## 2021-11-03 MED ORDER — CEFAZOLIN INJ 1GM IVP
2 g | Freq: Once | INTRAVENOUS | 0 refills
Start: 2021-11-03 — End: ?

## 2021-11-03 MED ORDER — LIDOCAINE HCL 2 % MM JELP
Freq: Once | TOPICAL | 0 refills
Start: 2021-11-03 — End: ?

## 2021-11-03 MED ORDER — ASPIRIN 325 MG PO TAB
325 mg | Freq: Once | ORAL | 0 refills
Start: 2021-11-03 — End: ?

## 2021-11-03 MED ORDER — LIDOCAINE (PF) 10 MG/ML (1 %) IJ SOLN
.2 mL | INTRAMUSCULAR | 0 refills | PRN
Start: 2021-11-03 — End: ?

## 2021-11-03 MED ORDER — SODIUM CHLORIDE 0.9 % IV SOLP
INTRAVENOUS | 0 refills
Start: 2021-11-03 — End: ?

## 2021-11-03 NOTE — Telephone Encounter
Called patient to schedule Watchman implant with Dr. Noheria  Patient agreed to date of service, 02/20/2022  Pre procedure appointments discussed.  Patient verbalized understanding of dates/times/locations of all appointments.  Pre procedure instructions sent via MyChart per request.

## 2021-11-03 NOTE — Patient Instructions
ELECTROPHYSIOLOGY PRE-ADMISSION INSTRUCTIONS    Patient Name: Mason Lawson  MRN#: [id number]  Date of Birth: September 03, 1945 (76 y.o.)  Today's Date: 11/03/2021    PROCEDURE:  You are scheduled for Left Atrial Appendage Closure Device Placement (Watchman) with Dr. Annamarie Dawley.      ARRIVAL TIME:  Please report to the [facility] admitting office on the ground floor of the [facility] on: 02/20/2022    The [facility] is located at [address], [place], [place] [address]. Park in [employer] and enter through the  Main front doors to the hospital. The Heart Center is located on the right-hand side as soon as you walk in.    The EP Lab will call to notify you of your arrival time.  They will call on the business day prior to your procedure.  (If you have any questions regarding your arrival time for the Electrophysiology Lab, please call the EP Lab at [phone].)    PRE-PROCEDURE APPOINTMENTS:  TBD CCTA (coronary computed tomography angiography)    Nothing to eat for 4 hours prior to the test  Drink plenty clear liquids up to time of the test  No caffeine/smoking the morning of the the test  No NSAIDS for 24 hours starting the day of the test  No Diuretics for 24 hours starting the day of the test  Diabetic patients:  hold oral diabetic medications the morning of the test    Metformin:  hold the morning of the test and for the 24 hours after the test    Long-acting insulin:  take 1/3 of your scheduled dose the morning of the test    Short-acting insulin:  hold the morning of your test      02/09/2022 at 1:30 pm   Office visit to update history and physical (requirement within 30 days of procedure) with Roosvelt Maser, APRN at [facility]   02/09/2022 at 2:20 pm Pre-Operative Assessment Clinic visit with Anesthesia Department.  Go to the main hospital entrance of the [facility]. The anesthesia clinic is just inside the entrance on the left side of lobby -  across from the Information Desk.     02/09/2022 Pre-Admission lab work: BMP, CBC, and Magnesium at your Pre-Operative Assessment Visit.         SPECIAL MEDICATION INSTRUCTIONS  Nothing to eat or drink after midnight before your procedure.  Take your prescription medications with a sip of water as instructed.  No caffeine for 24 hours prior to your procedure.     Any new prescriptions will be sent to your pharmacy listed on file with [place].   HOLD ALL over the counter vitamins or supplements on the morning of your procedure.  Diuretics: Aldactone (spironolactone) -- hold the morning of your procedure.  and Lasix (furosemide) -- hold the morning of your procedure.   Hypoglycemics: metformin (Glucophage) -- hold the morning of your procedure.  and empagliflozin (Jardiance) -- hold for 3 days prior to procedure  ACE/ARBs: Cozaar (losartan) -- hold the morning of your procedure.   Anticoagulants: Do not take apixaban (Eliquis) on the morning of your procedure. --- DO NOT MISS ANY OTHER DOSES ---         Additional Instructions  If you wear CPAP, please bring your mask and machine with you to the hospital.    Take a bath or shower with anti-bacterial soap the evening before, or the morning of the procedure.  We will give this to you at your office visit.     Bring photo ID and your health insurance card(s).    Arrange for a driver to take you home from the hospital.    Bring an accurate list of your current medications with you to the hospital (all meds and supplements taken daily).    Wear comfortable clothes and don't bring valuables, other than photo identification card, with you to the hospital.    Please pack a bag for an overnight stay.     Please review your pre-procedure instructions and call the office at [phone] with any questions. You may ask to speak with any of Dr. Rodena Medin Noheria's nurses. There are several of [place] in the office that can assist you. For questions regarding post procedure care or restrictions please refer to the Your Care Instructions included in the  Left Atrial Appendage Closure Device Placement (Watchman) Packet.     ALLERGIES  Allergies   Allergen Reactions    Percodan [Oxycodone Hcl-Oxycodone-Asa] NAUSEA AND VOMITING and UNKNOWN       IMPORTANT INFORMATION:    VISITORS:  Per the current visitor policy for the Cardiovascular Labs, patients are allowed 1 visitor only in the pre/post rooms and no visitors are allowed to stay overnight with the patient.  There are several hotels near the hospital if you need to arrange for accommodations for your family or friend providing support and transportation home after your procedure.     FOLLOW UP APPTS:  There are several follow up appointments throughout the first year after implantation of this device.  I will contact you to schedule throughout the year.    Also, please remember:    You MAY NEED to take antibiotics prior to any dental procedures (including cleaning) for 6 months after your device placement.  This will help to prevent infection to the device.  Contact your primary care provider or cardiologist for an antibiotic prescription to be taken prior to any dental procedures.     _________________________________________  Form completed by: Tempie Hoist, BSN  Date completed: 11/03/21  Method: Via MyChart.    Please let me know if you have any questions or concerns.    Thanks so much,  Tempie Hoist, BSN, RN, [facility]- Nurse Navigator   [facility] [employer] [facility] [place] [facility]  [facility]  Phone: [phone]- [email or web address]  [address] [place], [place], [place] [address]

## 2021-11-03 NOTE — Telephone Encounter
-----   Message from Romilda Joy, [place] sent at 11/02/2021  2:44 PM CDT -----  Regarding: Mason Lawson said please arrange watchman and CCTA.     Thanks!

## 2021-11-07 ENCOUNTER — Encounter: Admit: 2021-11-07 | Discharge: 2021-11-07 | Payer: MEDICARE

## 2021-11-09 ENCOUNTER — Encounter: Admit: 2021-11-09 | Discharge: 2021-11-09 | Payer: MEDICARE

## 2021-12-13 ENCOUNTER — Encounter: Admit: 2021-12-13 | Discharge: 2021-12-13 | Payer: MEDICARE

## 2021-12-13 DIAGNOSIS — E785 Hyperlipidemia, unspecified: Secondary | ICD-10-CM

## 2021-12-13 DIAGNOSIS — Z0189 Encounter for other specified special examinations: Secondary | ICD-10-CM

## 2021-12-13 DIAGNOSIS — R072 Precordial pain: Secondary | ICD-10-CM

## 2021-12-13 DIAGNOSIS — E7800 Pure hypercholesterolemia, unspecified: Secondary | ICD-10-CM

## 2021-12-13 DIAGNOSIS — I10 Essential (primary) hypertension: Secondary | ICD-10-CM

## 2021-12-13 DIAGNOSIS — R0989 Other specified symptoms and signs involving the circulatory and respiratory systems: Secondary | ICD-10-CM

## 2021-12-13 DIAGNOSIS — R079 Chest pain, unspecified: Secondary | ICD-10-CM

## 2021-12-13 DIAGNOSIS — I4891 Unspecified atrial fibrillation: Secondary | ICD-10-CM

## 2021-12-13 DIAGNOSIS — Z950 Presence of cardiac pacemaker: Secondary | ICD-10-CM

## 2021-12-13 DIAGNOSIS — I2510 Atherosclerotic heart disease of native coronary artery without angina pectoris: Secondary | ICD-10-CM

## 2021-12-13 DIAGNOSIS — R011 Cardiac murmur, unspecified: Secondary | ICD-10-CM

## 2021-12-13 DIAGNOSIS — R9439 Abnormal result of other cardiovascular function study: Secondary | ICD-10-CM

## 2021-12-13 DIAGNOSIS — E039 Hypothyroidism, unspecified: Secondary | ICD-10-CM

## 2021-12-13 DIAGNOSIS — J449 Chronic obstructive pulmonary disease, unspecified: Secondary | ICD-10-CM

## 2021-12-13 MED ORDER — REPATHA SURECLICK 140 MG/ML SC PNIJ
140 mg | SUBCUTANEOUS | 3 refills | 28.00000 days | Status: DC
Start: 2021-12-13 — End: 2021-12-13
  Filled 2021-12-14: qty 2, 28d supply, fill #1

## 2021-12-13 MED ORDER — REPATHA SURECLICK 140 MG/ML SC PNIJ
140 mg | SUBCUTANEOUS | 3 refills | 28.00000 days | Status: AC
Start: 2021-12-13 — End: ?

## 2021-12-13 NOTE — Progress Notes
Date of Service: 12/13/2021    Mason Lawson is a 76 y.o. male.       HPI     Mason Lawson is followed for coronary artery diseases, peripheral arterial disease, hyperlipidemia,?aortic stenosis,?hypertension?and heart failure with preserved ejection fraction. Any congestive?symptoms have been very well controlled on his current medical regimen of furosemide 40 mg every other day and Jardiance 25 mg daily. ?He has not had to take supplemental furosemide in recent months.??Mason Lawson?reports no recent hospitalizations or emergency room visits.   The patient indicates that he notices tiredness in his legs when he walks uphill, but this is a chronic symptom that has not progressed in recent years.  He swims for 30 minutes 6 days a week.? He can walk long distances on a flat surface.  He reports no exertional or stress related chest discomfort, whatsoever.  Otherwise, Mason Lawson indicates that he has been stable and?he reports no angina, congestive symptoms, palpitations, sensation of sustained forceful heart pounding, lightheadedness or syncope.?The patient reports no myalgias, bleeding abnormalities,?claudication?neurologic motor abnormalities or difficulty with speech. ?He has been tolerating the 40 mg dose of rosuvastatin without adverse effects.??Wearing support stockings usually helps him control his edema well.??Mason Lawson reports that he has been scheduled for implantation of a Watchman device in August 2023.   Historically, Mason Lawson underwent coronary artery bypass surgery in December 2005. At that time his revascularization consisted of an IMA graft to the LAD with a right internal mammary going to the obtuse marginal and a radial to the posterior descending artery. I saw Mason Lawson on 09/23/10 after an abnormal stress test was recently obtained. On 09/23/10 he told me that he had infrequent mild angina which he had noted over the past year. It occurred less than once a month and only when he was walking uphill and only when he was walking shortly after a meal. On 09/17/10 he walked 6 blocks in 10 minutes during a parade and had no angina because he had not eaten recently and was not walking uphill. I reviewed his stress test with him and he wanted to proceed with coronary angiography which was performed on 09/26/10. Intervention was not required. On 09/30/10 carvedilol was added to his medical regimen for BP control and he has tolerated this medication without difficulty. On 10/21/10 HCTZ 25 mg with triamterene 37.5 mg daily was also added to his medical regimen for improved BP control. ?The patient says that he had cellulitis in his left lower extremity in September 2015 that resolved with antibiotic therapy. Mason Lawson?developed dyspnea with exertion and?underwent right and left heart catheterization/coronary angiography on 06/18/2018.?Intervention was not required.He reports that he developed Covid illness on January 4?2021?with mild to moderate symptoms. ?Mason Lawson?reports that he had a chest x-ray and there was no evidence for pneumonia. ?He was placed on supplemental oxygen which he has used occasionally since developing Covid illness.?He was hospitalized in [place] in April 2021 for hypertension with exacerbation of congestive heart failure.??[name]?He had typical atrial flutter with bradycardic response and was hospitalized in July 2022 and seen by Dr. Bernette Mayers on consult service. ?On January 21, 2021 Dr. Wallene Huh did cavotricuspid isthmus ablation. 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 Derrell Lolling implanted a dual-chamber pacemaker.          Vitals:    12/13/21 1004   BP: 122/72   BP Source: Arm, Left Upper   Pulse: 73   SpO2: 93%   O2 Percent: 93 %  O2 Device: None (Room air)   PainSc: Zero   Weight: 99.2 kg (218 lb 12.8 oz)   Height: 180.3 cm (5' 11)     Body mass index is 30.52 kg/m?[name]     Past Medical History  Patient Active Problem List    Diagnosis Date Noted   ? 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 Derrell Lolling     ? Atrial fibrillation (HCC) 05/13/2021   ? Bradycardia 02/01/2021   ? DM (diabetes mellitus) type II, controlled, with peripheral vascular disorder (HCC) 02/01/2021   ? Paroxysmal nocturnal dyspnea 02/01/2021   ? Atrial flutter (HCC) 01/19/2021     01/21/2021 - AFL RFA with Dr. Wallene Huh:  Successful cavo-tricuspid isthmus ablation.  Comprehensive EP study with coronary sinus catheter placement.  Underlying severe sinus node dysfunction. No escape rhythm.     ? Chronic heart failure with preserved ejection fraction (HCC) 12/04/2018     RIGHT HEART CATHETERIZATION:  06/2018 in the setting of severe hypertension  HEMODYNAMICS:   Blood pressure 198/81 with a mean of 130 mmHg. Heart rate 50 beats per minute.   PRESSURES:  1. The right atrial pressure was 4 mmHg. Right ventricular pressure was 64/8 mmHg.  2. The pulmonary artery pressure was 60/70 mmHg with a mean of 32 mmHg. The pulmonary capillary wedge pressure was 17 mmHg. The transpulmonary gradient was 15 mmHg.  3. The diastolic pulmonary gradient was 0 mmHg. The pulmonary vascular resistance was 2 Wood units.   RIGHT SATURATIONS: The right atrial saturation 77%. The pulmonary artery saturation was 75%. The aortic saturation was 96%.   OUTPUTS: The cardiac output by Fick method was 5.8 L/minute with a cardiac index of 2.7 L/minute per sq m. The cardiac index by thermodilution was 7.3 with a cardiac index of 3.4 L/minute per sq m.     ? Nonrheumatic aortic valve stenosis 06/06/2018     07/25/2021 - ECHO:  A septal wall motion abnormality is noted consistent with prior cardiac surgery. ?No other regional wall motion abnormalities are noted. Overall left ventricular systolic function appears normal and dynamic. The estimated left ventricular ejection fraction is 65%.  There is mild concentric left ventricular hypertrophy.  Mild right ventricular enlargement with normal right ventricular systolic function. Mild left atrial enlargement.  Moderate aortic valve stenosis.  No pericardial effusion is seen.     ? Dyslipidemia 10/23/2012   ? Chest pain 09/26/2010   ? Abnormal stress test 09/26/2010   ? Murmur 09/23/2010   ? Coronary artery disease      A. Onset mid 2004, progression fall 2005, patient reduces activity at work to avoid symptoms  B. 12/05 Progressive angina, Dr. Duanne Limerick, then 06/29/04 CBP Atch OV, referred for cath          >12/29 Cath 100%RCA, 75%LAD&Cx,50%LM EF 55%          >07/01/05 CABGx3 LIMA-LAD w/ Y-RIMA-OM, Rad-PDA per Dr. Ky Barban  C. 06/29/08 exercise echocardiographic images demonstrated basal septal ischemia, significant hypertensive response to exercise, good and adequate exercise tolerance with no angina.  D. LHC 06/2018 for new heart failure symptoms: LEFT HEART CATHETERIZATION:  HEMODYNAMICS:  The aortic pressure was- 195/63 with a mean of 105 mmHg .  The left ventricular systolic pressure was 210 mmHg.  There was a peak to peak gradient of 15 mmHg.  1. Left main coronary artery:  Left main coronary artery arises normally from the left coronary sinus.  The left  main coronary artery bifurcates into left anterior descending and left circumflex artery and is free of angiographically significant disease.  2. Left anterior descending:  Left anterior descending is a large vessel which gives rise to- 3 diagonal vessels. 1 diagonal has a 80% ostial stenosis, the rest of diagonal vessels do not appear to be angiographically significant. Distal LAD has competitive flow from the LIMA.   3. Left circumflex artery:  The left circumflex artery appears to be medium caliber and has a 100% stenosis proximally. The mid-to-distal portion of the vessel fills from bridging collaterals from proximal portion and is diffusely disease. Three obtuse marginal branches arise, which are also diffusely diseased.   4. Right coronary artery:  The right coronary artery is a medium caliber vessel, but has 100% chronic total occlusion after the takeoff of conus branch. The PDA fills retrogradely from the radial graft to supply the PLV and has mild plaquing in the native PDA.    BYPASS GRAFT ANATOMY:  1.The LIMA to LAD was visualized using IM catheter.  The ostium, body was noted to be free of any angiographic disease, but has a 80% stenosis at the anastamosis with diffusely diseased distal LAD. There is a Y jump graft from the LIMA to the OM branch which fills retrogradely.            2. The radial artery graft to the PDA appears to be without any angiographically significant disease at ostium, body or anastomotic site.   E. Regadenosom MPI 11/2018 TID Ratio:  1.05  (normal <1.36). Summed Stress Score:  3   , Summed Rest Score:  0 Quantitative polar maps show a statistically insignificant amount of inferoapical ischemia. Mild inferobasilar septal hypokinesis.       Left Ventricular Ejection Fraction (post stress, in the resting state) =? 52 %.  Left Ventricular End Diastolic Volume: 161 mL  ?    SUMMARY/OPINION:?? This study is borderline abnormal with limited reversible uptake in the inferior wall.  All segments are viable.  Global left ventricular function is within normal limits. In aggregate the current study is low risk in regards to predicted annual cardiovascular mortality rate.  ?     ? Hypercholesterolemia      A.  02/28/01 total 324 total 249 HDL 43 LDL 231 Pt deferred meds  B. 06/29/04 total 273 trig 193 HDL 36 LDLd206 Zocor begun dc'd by pt d/t statinphobia and cost  C.  2/24 Rx Vytorin 10/80 1/2 tab.     ? Hypothyroidism      A.  2004 presentation with TSH over 200, Rx w l-thyroxine     ? Hypertension      A.  2/06 Altace 5 dc'd w/ dysgeusia, Metoprolol 25 BID begun        ? Tests ordered      A. 07/17/08 carotid artery duplex scan: no significant stenosis           Review of Systems   Constitutional: Negative.   HENT: Negative.    Eyes: Negative. Cardiovascular: Positive for dyspnea on exertion.   Respiratory: Positive for sleep disturbances due to breathing.    Endocrine: Positive for polyuria.   Hematologic/Lymphatic: Negative.    Skin: Negative.    Gastrointestinal: Positive for heartburn.   Genitourinary: Positive for frequency.   Psychiatric/Behavioral: Negative.    Allergic/Immunologic: Negative.        Physical Exam  GENERAL: The patient is well developed, well nourished, resting comfortably and in no distress. ?  HEENT: No abnormalities of the visible oro-nasopharynx, conjunctiva or sclera are noted. ?  NECK: There is no jugular venous distension. Carotids are palpable and without bruits. There is no thyroid enlargement. ?  Chest: Lung fields are clear to auscultation. There are no wheezes or crackles. ?  CV: There is a regular rhythm. The first and second heart sounds are normal. A grade 3/6 systolic murmur is heard, loudest in the right upper sternal area. There are no murmurs, gallops or rubs.??I checked his apical heart rate and?it?was?68?bpm.  ABD: The abdomen is soft and supple with normal bowel sounds. There is no hepatosplenomegaly, ascites, tenderness, masses or bruits. ?  Neuro: There are no focal motor defects. Ambulation is normal. Cognitive function appears normal. ?  Ext:?There is trace bipedal edema without?evidence of deep vein thrombosis.??Peripheral pulses are decreased but he has normal capillary refill and no ischemic ulcers. ?  SKIN:?No rashes or cellulitis.  PSYCH:?The patient is calm, rationale and oriented.    Cardiovascular Studies  A twelve-lead ECG obtained on 12/13/2021 reveals atrial and ventricular pacing with heart rate of 63 bpm.  Echo Doppler 07/25/2021:  Interpretation Summary    1. A septal wall motion abnormality is noted consistent with prior cardiac surgery. ?No other regional wall motion abnormalities are noted. Overall left ventricular systolic function appears normal and dynamic. The estimated left ventricular ejection fraction is 65%.   2. There is mild concentric left ventricular hypertrophy.  3. Mild right ventricular enlargement with normal right ventricular systolic function.  4. Mild left atrial enlargement.  5. Moderate aortic valve stenosis.  6. No pericardial effusion is seen.  ?  No significant change is noted when compared with the prior echo Doppler exam performed on 01/20/2021.  Echocardiographic Findings    Left Ventricle The left ventricular size is normal. Mild concentric hypertrophy. A septal wall motion abnormality is noted consistent with prior cardiac surgery.  No other regional wall motion abnormalities are noted. Overall left ventricular systolic function appears normal and dynamic. The estimated left ventricular ejection fraction is 65%. Insufficient parameters could be obtained to adequately assess left ventricular diastolic function.      Right Ventricle The right ventricle is mildly dilated. The right ventricular wall thickness is normal. The right ventricular systolic function is normal.      Left Atrium Mildly dilated.      Right Atrium Normal size.      IVC/SVC Normal central venous pressure (0-5 mm Hg).      Mitral Valve No stenosis. Trace regurgitation. There is moderate mitral annular calcification.      Tricuspid Valve Normal valve structure. No stenosis. Trace regurgitation.      Aortic Valve The valve is sclerotic. The peak transaortic velocity is 3.4 meters/sec., yielding a mean aortic valve gradient of 23 mm Hg and a peak aortic valve gradient of 46 mm Hg. The estimated aortic valve area calculated using the continuity equation is 1.6 cm? and the aortic Doppler velocity index = 0.39.  Visual assessment and Doppler exam is most consistent with moderate aortic valve stenosis. Trace regurgitation.      Pulmonary Normal valve structure. No stenosis. Trace regurgitation.      Aorta The aortic root and the visualized portions of the ascending aorta appear normal in size.      Pericardium No pericardial effusion.        Remote pacemaker interrogation 11/09/2021:  Interpretation Summary    Title: Remote Check    * rec'd 11/08/21 @ 21:42  *  Battery: OK, 11.67 yrs   * Sensing, impedance and thresholds reviewed  * Programmed parameters reviewed: AAIR<=>DDDR 60-130 bpm  * Presenting rhythm: AP-VP 64 bpm  * VP 97.8%, AP 99.9%  * Heart Rate Histograms reviewed: minimal rate distribution      Title: Non-sustained Ventricular Tachycardia    * Stored EGMs are suggestive of Non-sustained VT  * Total episodes: 7 NSVT detections w/ Avg V. rates 158-207 bpm  * Last: 5/3 @ 09:46 lasting 4sec w/ Avg V. rate 207 bpm      Additional Notes: Green nurse team notified of NSVT episodes.    Cardiovascular Health Factors  Vitals BP Readings from Last 3 Encounters:   12/13/21 122/72   07/25/21 (!) 150/70   07/05/21 138/88     Wt Readings from Last 3 Encounters:   12/13/21 99.2 kg (218 lb 12.8 oz)   11/02/21 98.9 kg (218 lb)   07/25/21 103.4 kg (228 lb)     BMI Readings from Last 3 Encounters:   12/13/21 30.52 kg/m?   11/02/21 30.40 kg/m?   07/25/21 31.80 kg/m?      Smoking Social History     Tobacco Use   Smoking Status Former   ? Packs/day: 1.00   ? Types: Cigarettes   ? Quit date: 06/02/2004   ? Years since quitting: 17.5   Smokeless Tobacco Never   Vaping Use   ? Vaping Use: Never used      Lipid Profile Cholesterol   Date Value Ref Range Status   04/12/2021 152  Final     HDL   Date Value Ref Range Status   04/12/2021 40  Final     LDL   Date Value Ref Range Status   01/19/2021 75 <100 mg/dL Final     Triglycerides   Date Value Ref Range Status   04/12/2021 191 (H) <150 Final      Blood Sugar Hemoglobin A1C   Date Value Ref Range Status   04/12/2021 6.8 (H) <5.6 Final     Glucose   Date Value Ref Range Status   07/15/2021 98  Final   04/12/2021 151 (H) 70 - 105 Final   01/22/2021 134 (H) 70 - 100 MG/DL Final   09/81/1914 782 (H) 70 - 110 MG/DL Final   95/62/1308 657 (H) 70 - 110 MG/DL Final   84/69/6295 284 (H) 70 - 110 MG/DL Final     Glucose, POC   Date Value Ref Range Status   06/18/2018 122 (H) 70 - 100 MG/DL Final   13/24/4010 272 (H) 70 - 100 MG/DL Final   53/66/4403 474 (H) 70 - 110 MG/DL Final          Problems Addressed Today  Encounter Diagnoses   Name Primary?   ? Cardiovascular symptoms Yes   ? Coronary artery disease due to lipid rich plaque    ? Primary hypertension    ? Murmur    ? Precordial pain    ? Dyslipidemia        Assessment and Plan     Mr. Syme feels that he is doing well.    He currently reports no angina or congestive symptoms.  He has been scheduled for implantation of left atrial appendage occlusion device. Pacemaker surveillance has showed no recurrence of atrial flutter/fibrillation. It does not appear that he is having any symptoms related to his aortic valve stenosis at this time. Mr. Verba understands that he needs remote pacemaker checks every 3 months  along with a yearly comprehensive pacemaker interrogation (in office).    His LDL cholesterol remains above 70 mg/dL on high-dose statin therapy and Zetia.  Alternatives for the treatment of hypercholesterolemia were reviewed with the patient he wanted to add Repatha to his lipid-lowering regimen.  Possible adverse effects associated with Repatha were reviewed with the patient he was asked to report any worrisome symptoms.  I have asked him to obtain a fasting lipid profile and ALT 3 months after starting Repatha.  He was also given a requisition to check his Chem-7 within the next month.  The total time spent during this interview and exam was 30 minutes.         Current Medications (including today's revisions)  ? amLODIPine (NORVASC) 5 mg tablet Take one tablet by mouth twice daily.   ? apixaban (ELIQUIS) 5 mg tablet Take one tablet by mouth twice daily.   ? calcium carbonate (TUMS) 500 mg (200 mg elemental calcium) chewable tablet Chew one tablet by mouth daily as needed.   ? CHOLEcalciferoL (vitamin D3) (VITAMIN D3) 50 mcg (2,000 unit) tablet Take one tablet by mouth daily.   ? coenzyme Q10 100 mg cap Take one capsule by mouth three times weekly.   ? CRANBERRY FRUIT EXTRACT (CRANBERRY PO) Take 4,200 mg by mouth daily.   ? empagliflozin (JARDIANCE) 25 mg tablet Take one tablet by mouth daily.   ? evolocumab (REPATHA SURECLICK) 140 mg/mL injectable PEN Inject 1 mL under the skin every 14 days.   ? ezetimibe (ZETIA) 10 mg tablet TAKE 1 TABLET EVERY DAY   ? ferrous sulfate (FEOSOL) 325 mg (65 mg iron) tablet Take one tablet by mouth three times weekly. Take on an empty stomach at least 1 hour before or 2 hours after food.   ? furosemide (LASIX) 40 mg tablet Take one tablet by mouth every 48 hours.   ? glucosamine/chondro su A/C/Mn (GLUCOSAMINE-CHONDROITIN COMPLX PO) Take 1 tablet by mouth daily.   ? hydrALAZINE (APRESOLINE) 50 mg tablet TAKE 1 TABLET TWICE DAILY   ? levothyroxine (SYNTHROID) 175 mcg tablet Take one tablet by mouth daily 30 minutes before breakfast.   ? losartan (COZAAR) 100 mg tablet TAKE 1 TABLET EVERY DAY   ? metFORMIN (GLUCOPHAGE) 500 mg tablet Take one tablet by mouth twice daily with meals. Ok to resume 06/21/18 (Patient taking differently: Take one tablet by mouth twice daily with meals.)   ? omeprazole DR(+) (PRILOSEC) 20 mg PO capsule Take one capsule by mouth daily.   ? rosuvastatin (CRESTOR) 40 mg tablet TAKE 1 TABLET EVERY DAY   ? spironolactone (ALDACTONE) 25 mg tablet TAKE ONE TABLET BY MOUTH DAILY. TAKE WITH FOOD.   ? vitamins, multiple tablet Take one tablet by mouth every 48 hours.

## 2021-12-13 NOTE — Patient Instructions
Thank you for visiting our office today.    We would like to make the following medication adjustments:      Repatha injection every 14 days    ** Fasting labs in 3 months**      Otherwise continue the same medications as you have been doing.          We will be pursuing the following tests after your appointment today:       Orders Placed This Encounter    LIPID PROFILE    ALT (SGPT)    ECG 12-LEAD    evolocumab (REPATHA SURECLICK) 140 mg/mL injectable PEN         We will plan to see you back in 6 months.  Please call us in the meantime with any questions or concerns.        Please allow 5-7 business days for our providers to review your results. All normal results will go to MyChart. If you do not have Mychart, it is strongly recommended to get this so you can easily view all your results. If you do not have mychart, we will attempt to call you once with normal lab and testing results. If we cannot reach you by phone with normal results, we will send you a letter.  If you have not heard the results of your testing after one week please give [place] a call.       Your Cardiovascular Medicine [place] Team Brett Canales, Pilar Jarvis, Shawna Orleans, and [place])  phone number is [phone].

## 2021-12-14 ENCOUNTER — Encounter: Admit: 2021-12-14 | Discharge: 2021-12-14 | Payer: MEDICARE

## 2021-12-14 DIAGNOSIS — R011 Cardiac murmur, unspecified: Secondary | ICD-10-CM

## 2021-12-14 DIAGNOSIS — I10 Essential (primary) hypertension: Secondary | ICD-10-CM

## 2021-12-14 DIAGNOSIS — I2510 Atherosclerotic heart disease of native coronary artery without angina pectoris: Secondary | ICD-10-CM

## 2021-12-14 DIAGNOSIS — R0989 Other specified symptoms and signs involving the circulatory and respiratory systems: Secondary | ICD-10-CM

## 2021-12-14 DIAGNOSIS — E785 Hyperlipidemia, unspecified: Secondary | ICD-10-CM

## 2021-12-14 DIAGNOSIS — R072 Precordial pain: Secondary | ICD-10-CM

## 2021-12-14 LAB — BASIC METABOLIC PANEL
ANION GAP: 11
BLD UREA NITROGEN: 19
CALCIUM: 9.8
CHLORIDE: 105
CO2: 21 — ABNORMAL LOW (ref 23–31)
CREATININE: 0.8
GFR ESTIMATED: 97
GLUCOSE,PANEL: 162 — ABNORMAL HIGH (ref 70–105)
POTASSIUM: 4.1
SODIUM: 137

## 2021-12-14 MED ORDER — SPIRONOLACTONE 25 MG PO TAB
ORAL_TABLET | ORAL | 3 refills | 90.00000 days | Status: AC
Start: 2021-12-14 — End: ?

## 2021-12-14 NOTE — Progress Notes
The Prior Authorization for REPATHA was approved for Mason Lawson from 07/03/21 to 07/02/22.  The copay is $47.00.  The PA authorization number is [id number].    LEFT MESSAGE FOR PT TO CALL BACK WITH FILLING INSTRUCTIONS        Wallene Huh  Pharmacy Patient Advocate  [phone]

## 2021-12-15 ENCOUNTER — Encounter: Admit: 2021-12-15 | Discharge: 2021-12-15 | Payer: MEDICARE

## 2021-12-15 NOTE — Telephone Encounter
-----   Message from Hester Mates, MD sent at 12/14/2021 11:14 AM CDT -----  To all: Elevated blood sugar.  Renal function appears stable.  Thanks.  SBG  ----- Message -----  From: Rogelia Boga, RN  Sent: 12/14/2021   9:33 AM CDT  To: Hester Mates, MD    Labs for your review.  Please let me know your recommendations.  Thank you!

## 2021-12-15 NOTE — Telephone Encounter
Results and recommendations called to patient lmom requested call back if questions

## 2021-12-16 ENCOUNTER — Encounter: Admit: 2021-12-16 | Discharge: 2021-12-16 | Payer: MEDICARE

## 2021-12-16 MED ORDER — REPATHA SURECLICK 140 MG/ML SC PNIJ
140 mg | SUBCUTANEOUS | 11 refills | 28.00000 days | Status: AC
Start: 2021-12-16 — End: ?

## 2021-12-16 NOTE — Telephone Encounter
Patient called to report the prescription cost at [facility] is cost prohibitive. He would like the prescription sent to the [facility] pharmacy to check the price there.

## 2021-12-27 ENCOUNTER — Encounter: Admit: 2021-12-27 | Discharge: 2021-12-27 | Payer: MEDICARE

## 2021-12-28 ENCOUNTER — Encounter: Admit: 2021-12-28 | Discharge: 2021-12-28 | Payer: MEDICARE

## 2022-01-12 ENCOUNTER — Encounter: Admit: 2022-01-12 | Discharge: 2022-01-12 | Payer: MEDICARE

## 2022-01-12 MED ORDER — LOSARTAN 100 MG PO TAB
ORAL_TABLET | ORAL | 3 refills | 90.00000 days | Status: AC
Start: 2022-01-12 — End: ?

## 2022-01-12 MED ORDER — FUROSEMIDE 40 MG PO TAB
ORAL_TABLET | ORAL | 3 refills | 90.00000 days | Status: AC
Start: 2022-01-12 — End: ?

## 2022-01-18 ENCOUNTER — Encounter: Admit: 2022-01-18 | Discharge: 2022-01-18 | Payer: MEDICARE

## 2022-01-18 NOTE — Progress Notes
7-19 [employer] for Inpatient Watchman [id number], [phone]  Approved for 1 Inpt day  Authorization #[id number]  Tracking [phone]         confirmed benefits and eligibility:  Current and active since 07-03-18 no term, $ deductible with required   co-insurance of  % to max OOP $ , then plan will pay 100% of allowable charges.

## 2022-01-25 ENCOUNTER — Encounter: Admit: 2022-01-25 | Discharge: 2022-01-25 | Payer: MEDICARE

## 2022-01-25 NOTE — Patient Instructions
Coronary CT Angiography Instructions    Mason Lawson  [id number]  1945/11/19  01/25/2022    ARRIVAL TIME    Please report to the Cardiovascular Medicine Clinic at the [facility] on: 02/09/22  Please arrive at the following time: 1145 for CT 130 office visit and 220 PAC       DO NOT EAT FOR 4 HOURS PRIOR TO YOUR PROCEDURE.  YOU SHOULD DRINK PLENTY OF CLEAR LIQUIDS UP TO ARRIVAL AT OFFICE.      It is important to take these medications exactly as written.  These medications prepare you for the procedure.    Do Not Take Metformin The Day Of The Procedure And Do Not Take For 24 Hours After Procedure: Verified    Do Not Take The Following Oral Diabetic Medications The Morning Of The Procedure: Jardiance         Do not take any non-steroidal inflammatory medications, (ibuprofen, Aleve, Advil, etc.) for 48 hours beginning the day of the procedure.    Hold All Diuretics For 24 Hours Beginning The Day Of The Procedure: Verified       If you have any questions, please call the [facility] office at [phone]

## 2022-02-09 ENCOUNTER — Ambulatory Visit: Admit: 2022-02-09 | Discharge: 2022-02-09 | Payer: MEDICARE

## 2022-02-09 ENCOUNTER — Encounter: Admit: 2022-02-09 | Discharge: 2022-02-09 | Payer: MEDICARE

## 2022-02-09 DIAGNOSIS — Z0189 Encounter for other specified special examinations: Secondary | ICD-10-CM

## 2022-02-09 DIAGNOSIS — R9439 Abnormal result of other cardiovascular function study: Secondary | ICD-10-CM

## 2022-02-09 DIAGNOSIS — Z950 Presence of cardiac pacemaker: Secondary | ICD-10-CM

## 2022-02-09 DIAGNOSIS — J449 Chronic obstructive pulmonary disease, unspecified: Secondary | ICD-10-CM

## 2022-02-09 DIAGNOSIS — I483 Typical atrial flutter: Secondary | ICD-10-CM

## 2022-02-09 DIAGNOSIS — R0989 Other specified symptoms and signs involving the circulatory and respiratory systems: Secondary | ICD-10-CM

## 2022-02-09 DIAGNOSIS — R079 Chest pain, unspecified: Secondary | ICD-10-CM

## 2022-02-09 DIAGNOSIS — I4891 Unspecified atrial fibrillation: Secondary | ICD-10-CM

## 2022-02-09 DIAGNOSIS — I2510 Atherosclerotic heart disease of native coronary artery without angina pectoris: Secondary | ICD-10-CM

## 2022-02-09 DIAGNOSIS — E7800 Pure hypercholesterolemia, unspecified: Secondary | ICD-10-CM

## 2022-02-09 DIAGNOSIS — I350 Nonrheumatic aortic (valve) stenosis: Secondary | ICD-10-CM

## 2022-02-09 DIAGNOSIS — E039 Hypothyroidism, unspecified: Secondary | ICD-10-CM

## 2022-02-09 DIAGNOSIS — I480 Paroxysmal atrial fibrillation: Secondary | ICD-10-CM

## 2022-02-09 DIAGNOSIS — I10 Essential (primary) hypertension: Secondary | ICD-10-CM

## 2022-02-09 DIAGNOSIS — I4892 Unspecified atrial flutter: Secondary | ICD-10-CM

## 2022-02-09 LAB — CBC
HEMATOCRIT: 50 % — ABNORMAL HIGH (ref 40–50)
HEMOGLOBIN: 17 g/dL — ABNORMAL HIGH (ref 13.5–16.5)
MCH: 30 pg (ref 26–34)
MCHC: 33 g/dL (ref 32.0–36.0)
MCV: 90 FL — ABNORMAL HIGH (ref 80–100)
MPV: 8 FL (ref 7–11)
PLATELET COUNT: 208 K/UL (ref >60)
RDW: 14 % (ref 11–15)
WBC COUNT: 10 K/UL (ref 4.5–11.0)

## 2022-02-09 LAB — BASIC METABOLIC PANEL: SODIUM: 136 MMOL/L — ABNORMAL LOW (ref 137–147)

## 2022-02-09 LAB — POC CREATININE, RAD: CREATININE, POC: 0.8 mg/dL (ref 0.4–1.24)

## 2022-02-09 LAB — MAGNESIUM: MAGNESIUM: 1.9 mg/dL — ABNORMAL HIGH (ref 1.6–2.6)

## 2022-02-09 MED ORDER — SODIUM CHLORIDE 0.9 % IV SOLP
250 mL | INTRAVENOUS | 0 refills | Status: AC | PRN
Start: 2022-02-09 — End: ?

## 2022-02-09 MED ORDER — DIPHENHYDRAMINE HCL 50 MG PO CAP
50 mg | Freq: Once | ORAL | 0 refills | Status: AC | PRN
Start: 2022-02-09 — End: ?

## 2022-02-09 MED ORDER — IOHEXOL 350 MG IODINE/ML IV SOLN
100 mL | Freq: Once | INTRAVENOUS | 0 refills | Status: CP
Start: 2022-02-09 — End: ?
  Administered 2022-02-09: 17:00:00 100 mL via INTRAVENOUS

## 2022-02-09 MED ORDER — METHYLPREDNISOLONE SOD SUC(PF) 125 MG/2 ML IJ SOLR
125 mg | Freq: Once | INTRAVENOUS | 0 refills | Status: AC | PRN
Start: 2022-02-09 — End: ?

## 2022-02-09 MED ORDER — SODIUM CHLORIDE 0.9 % IV SOLP
250 mL | INTRAVENOUS | 0 refills | Status: AC
Start: 2022-02-09 — End: ?

## 2022-02-09 MED ORDER — SODIUM CHLORIDE 0.9 % IJ SOLN
50 mL | Freq: Once | INTRAVENOUS | 0 refills | Status: CP
Start: 2022-02-09 — End: ?
  Administered 2022-02-09: 17:00:00 50 mL via INTRAVENOUS

## 2022-02-09 MED ORDER — DIPHENHYDRAMINE HCL 50 MG/ML IJ SOLN
50 mg | Freq: Once | INTRAVENOUS | 0 refills | Status: AC | PRN
Start: 2022-02-09 — End: ?

## 2022-02-09 NOTE — Progress Notes
Date of Service: 02/09/2022    Mason Lawson is a 76 y.o. male.       HPI     Mason Lawson presents to electrophysiology clinic for history and physical in preparation for Watchman device implantation on 02/20/2022 by Dr. Milas Kocher.    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    He is a poor candidate for long-term anticoagulation due to elevated CHA2DS2-VASc score = 4 (see below) and history of falls such as slipping on the ice in the winter.     Despite high percentage of RV pacing, echocardiogram January/2023, showed LVEF 65%.    Today he denies chest pains, shortness of breath, palpitations, lightheadedness.  No recent falls.  He has been taking apixaban without any missed doses.    In person device check of [employer]: Normal device function with appropriate sensing, impedence, and thresholds. Adequate battery life with 10.92 years remaining.  AP = 99.79%, VP = 99.9%. Presenting rhythm: AP-VP at 61 bpm. 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 Events noted: 2 episodes of atrial fibrillation with the longest being 58 minutes and total A-fib burden less than 1%; 13 episodes of nonsustained VT which last occurred 01/25/2022 and longest episode lasted 10 seconds.    Preliminary EKG: A paced V paced at 62 bpm, PR = 21 2 ms, QRS duration = 174 ms with left bundle branch block appearance, QT = 460 ms, QTc = 466 ms    Left atrial appendage occlusion pre and post documentation (45 days, 6 months, 1 year and 2 years):  - Pre-procedure only  - Documentation of shared decision making (2 providers): Dr. Arna Medici and Dr. Milas Kocher  - CBC/CMP lab draw ordered:   - An evidence-based tool (EBT) was provided to patient to ensure that the patients? health goals and preferences were covered.  - A copy of the EBT can be accessed thru [facility] system under 'Left Atrial Appendage Occlusion (LAAO) Device Shared Decision-Making Tool'     - Modified Rankin Scale: 0  - Barthel Index Score: 100  - NYHA class: 0  - HAS-BLED score: HTN (1), Elderly >65 (1) and Current Antiplatelet or NSAID use (1)  -     HAS-BLED Yearly Risk: 3=3.74%    -     CHA2DS2-VASc Score: HTN (1), DM (1), >_ 75 (2) and Prior MI, PAD, or Aortic Plaque (1)  -     CHA2DSVASc Yearly Stroke Risk: 4=4%      Vitals:    02/09/22 1259 02/09/22 1311   BP: 132/58    BP Source: Arm, Right Upper    Pulse: 62    SpO2: 94%    O2 Device: CPAP/BiPAP CPAP/BiPAP   PainSc: Zero Zero   Weight: 98 kg (216 lb)    Height: 181.6 cm (5' 11.5)      Body mass index is 29.71 kg/m?[name]     Past Medical History  Patient Active Problem List    Diagnosis Date Noted   ? 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 Derrell Lolling     ?  Atrial fibrillation (HCC) 05/13/2021   ? Bradycardia 02/01/2021   ? DM (diabetes mellitus) type II, controlled, with peripheral vascular disorder (HCC) 02/01/2021   ? Paroxysmal nocturnal dyspnea 02/01/2021   ? Atrial flutter (HCC) 01/19/2021     01/21/2021 - AFL RFA with Dr. Wallene Huh:  Successful cavo-tricuspid isthmus ablation.  Comprehensive EP study with coronary sinus catheter placement.  Underlying severe sinus node dysfunction. No escape rhythm.     ? Chronic heart failure with preserved ejection fraction (HCC) 12/04/2018     RIGHT HEART CATHETERIZATION:  06/2018 in the setting of severe hypertension  HEMODYNAMICS:   Blood pressure 198/81 with a mean of 130 mmHg. Heart rate 50 beats per minute.   PRESSURES:  1. The right atrial pressure was 4 mmHg. Right ventricular pressure was 64/8 mmHg.  2. The pulmonary artery pressure was 60/70 mmHg with a mean of 32 mmHg. The pulmonary capillary wedge pressure was 17 mmHg. The transpulmonary gradient was 15 mmHg.  3. The diastolic pulmonary gradient was 0 mmHg. The pulmonary vascular resistance was 2 Wood units.   RIGHT SATURATIONS: The right atrial saturation 77%. The pulmonary artery saturation was 75%. The aortic saturation was 96%.   OUTPUTS: The cardiac output by Fick method was 5.8 L/minute with a cardiac index of 2.7 L/minute per sq m. The cardiac index by thermodilution was 7.3 with a cardiac index of 3.4 L/minute per sq m.     ? Nonrheumatic aortic valve stenosis 06/06/2018     07/25/2021 - ECHO:  A septal wall motion abnormality is noted consistent with prior cardiac surgery. ?No other regional wall motion abnormalities are noted. Overall left ventricular systolic function appears normal and dynamic. The estimated left ventricular ejection fraction is 65%.  There is mild concentric left ventricular hypertrophy.  Mild right ventricular enlargement with normal right ventricular systolic function. Mild left atrial enlargement.  Moderate aortic valve stenosis.  No pericardial effusion is seen.     ? Dyslipidemia 10/23/2012   ? Chest pain 09/26/2010   ? Abnormal stress test 09/26/2010   ? Murmur 09/23/2010   ? Coronary artery disease      A. Onset mid 2004, progression fall 2005, patient reduces activity at work to avoid symptoms  B. 12/05 Progressive angina, Dr. Duanne Limerick, then 06/29/04 CBP Atch OV, referred for cath          >12/29 Cath 100%RCA, 75%LAD&Cx,50%LM EF 55%          >07/01/05 CABGx3 LIMA-LAD w/ Y-RIMA-OM, Rad-PDA per Dr. Ky Barban  C. 06/29/08 exercise echocardiographic images demonstrated basal septal ischemia, significant hypertensive response to exercise, good and adequate exercise tolerance with no angina.  D. LHC 06/2018 for new heart failure symptoms: LEFT HEART CATHETERIZATION:  HEMODYNAMICS:  The aortic pressure was- 195/63 with a mean of 105 mmHg .  The left ventricular systolic pressure was 210 mmHg.  There was a peak to peak gradient of 15 mmHg.  1. Left main coronary artery:  Left main coronary artery arises normally from the left coronary sinus.  The left main coronary artery bifurcates into left anterior descending and left circumflex artery and is free of angiographically significant disease.  2. Left anterior descending:  Left anterior descending is a large vessel which gives rise to- 3 diagonal vessels. 1 diagonal has a 80% ostial stenosis, the rest of diagonal vessels do not appear to be angiographically significant. Distal LAD has competitive flow from the LIMA.   3. Left circumflex artery:  The left circumflex artery  appears to be medium caliber and has a 100% stenosis proximally. The mid-to-distal portion of the vessel fills from bridging collaterals from proximal portion and is diffusely disease. Three obtuse marginal branches arise, which are also diffusely diseased.   4. Right coronary artery:  The right coronary artery is a medium caliber vessel, but has 100% chronic total occlusion after the takeoff of conus branch. The PDA fills retrogradely from the radial graft to supply the PLV and has mild plaquing in the native PDA.    BYPASS GRAFT ANATOMY:  1.The LIMA to LAD was visualized using IM catheter.  The ostium, body was noted to be free of any angiographic disease, but has a 80% stenosis at the anastamosis with diffusely diseased distal LAD. There is a Y jump graft from the LIMA to the OM branch which fills retrogradely.            2. The radial artery graft to the PDA appears to be without any angiographically significant disease at ostium, body or anastomotic site.   E. Regadenosom MPI 11/2018 TID Ratio:  1.05  (normal <1.36). Summed Stress Score:  3   , Summed Rest Score:  0 Quantitative polar maps show a statistically insignificant amount of inferoapical ischemia. Mild inferobasilar septal hypokinesis.       Left Ventricular Ejection Fraction (post stress, in the resting state) =? 52 %.  Left Ventricular End Diastolic Volume: 454 mL  ?    SUMMARY/OPINION:?? This study is borderline abnormal with limited reversible uptake in the inferior wall.  All segments are viable.  Global left ventricular function is within normal limits. In aggregate the current study is low risk in regards to predicted annual cardiovascular mortality rate.  ?     ? Hypercholesterolemia      A.  02/28/01 total 324 total 249 HDL 43 LDL 231 Pt deferred meds  B. 06/29/04 total 273 trig 193 HDL 36 LDLd206 Zocor begun dc'd by pt d/t statinphobia and cost  C.  2/24 Rx Vytorin 10/80 1/2 tab.     ? Hypothyroidism      A.  2004 presentation with TSH over 200, Rx w l-thyroxine     ? Hypertension      A.  2/06 Altace 5 dc'd w/ dysgeusia, Metoprolol 25 BID begun        ? Tests ordered      A. 07/17/08 carotid artery duplex scan: no significant stenosis           Review of Systems   Constitutional: Negative.   HENT: Negative.    Eyes: Negative.    Cardiovascular: Negative.    Respiratory: Negative.    Endocrine: Negative.    Hematologic/Lymphatic: Negative.    Skin: Negative.    Musculoskeletal: Negative.    Gastrointestinal: Negative.    Genitourinary: Negative.    Neurological: Negative.    Psychiatric/Behavioral: Negative.    Allergic/Immunologic: Negative.        General Appearance: no acute distress  Visible Skin: warm & intact  HEENT: unremarkable; sclera non-icteric   Pulm: lungs clear to auscultation, no rales, rhonchi, or wheezing  Cardiac: Regular rhythm, normal rate,+murmur  Extremities: no lower extremity edema  Neurologic Exam: oriented to time, place and person; no obvious neurological deficits  Psychiatric: Normal mood and affect.  Behavior is normal.       Cardiovascular Health Factors  Vitals BP Readings from Last 3 Encounters:   02/09/22 139/74 02/09/22 132/58   12/13/21 122/72  Wt Readings from Last 3 Encounters:   02/09/22 98.9 kg (218 lb)   02/09/22 98 kg (216 lb)   12/13/21 99.2 kg (218 lb 12.8 oz)     BMI Readings from Last 3 Encounters:   02/09/22 30.40 kg/m?   02/09/22 29.71 kg/m?   12/13/21 30.52 kg/m?      Smoking Social History     Tobacco Use   Smoking Status Former   ? Packs/day: 1.00   ? Years: 40.00   ? Pack years: 40.00   ? Types: Cigarettes   ? Quit date: 06/02/2004   ? Years since quitting: 17.7   Smokeless Tobacco Never   Vaping Use   ? Vaping Use: Never used      Lipid Profile Cholesterol   Date Value Ref Range Status   04/12/2021 152  Final     HDL   Date Value Ref Range Status   04/12/2021 40  Final     LDL   Date Value Ref Range Status   01/19/2021 75 <100 mg/dL Final     Triglycerides   Date Value Ref Range Status   04/12/2021 191 (H) <150 Final      Blood Sugar Hemoglobin A1C   Date Value Ref Range Status   04/12/2021 6.8 (H) <5.6 Final     Glucose   Date Value Ref Range Status   02/09/2022 108 (H) 70 - 100 MG/DL Final   45/40/9811 914 (H) 70 - 105 Final   07/15/2021 98  Final   07/04/2004 121 (H) 70 - 110 MG/DL Final   78/29/5621 308 (H) 70 - 110 MG/DL Final   65/78/4696 295 (H) 70 - 110 MG/DL Final     Glucose, POC   Date Value Ref Range Status   06/18/2018 122 (H) 70 - 100 MG/DL Final   28/41/3244 010 (H) 70 - 100 MG/DL Final   27/25/3664 403 (H) 70 - 110 MG/DL Final          Problems Addressed Today  Encounter Diagnoses   Name Primary?   ? Cardiovascular symptoms Yes   ? Cardiac pacemaker    ? Paroxysmal atrial fibrillation (HCC)    ? Typical atrial flutter (HCC)        Assessment and Plan     Mr. Clemensen is prepared to have Watchman left atrial appendage device implantation with Dr. Milas Kocher on 02/20/2022.    Pre-Op instructions have been reviewed and a copy is in MyChart.     He is a poor candidate for long-term anticoagulation due to elevated CHA2DS2-VASc score = 4 and history of falls such as slipping on the ice in the winter.    Device check today showed several episodes of nonsustained ventricular tachycardia with the longest episode lasting about 10 seconds.  He was asymptomatic.  He is not on any AV nodal blocking agents and today declined initiation of metoprolol saying he took it in the past and he did not like how it made him feel (muscle pains). Most recent LHC 06/2018 (due to abnormal stress test) showed 80% stenosis of LIMA to LAD graft and anastomosis with diffusely diseased distal LAD; Y-jump graft from LIMA to OM branch which fills retrograde.  Medical management was recommended.    They are aware that post Watchman oral anticoagulation and aspirin EC 81 mg must be taken without interruption for at least 6 weeks. Information about how to take [facility] and TEE was placed in MyChart.    No recent illnesses or  injuries; pt reports at baseline state of health  PAT is scheduled for 02/09/2022  CCTA completed on 02/09/2022  OAC: apixaban  No iodine allergy  No difficulty being supine.     CTR visitor policy, Watchman device procedure / general anesthesia / TEE, pre and post-op experience (right groin puncture site / suture, extended bedrest, availability of pain medications), and discharge instructions reviewed with patient and all questions were answered to their satisfaction.     Mason Maser, APRN-NP    Total Time Today was 35 minutes in the following activities: Preparing to see the patient, Obtaining and/or reviewing separately obtained history, Performing a medically appropriate examination and/or evaluation, Counseling and educating the patient/family/caregiver, Documenting clinical information in the electronic or other health record and Independently interpreting results (not separately reported) and communicating results to the patient/family/caregiver         Current Medications (including today's revisions)  ? amLODIPine (NORVASC) 5 mg tablet Take one tablet by mouth twice daily.   ? apixaban (ELIQUIS) 5 mg tablet Take one tablet by mouth twice daily.   ? calcium carbonate (TUMS) 500 mg (200 mg elemental calcium) chewable tablet Chew one tablet by mouth daily as needed.   ? CHOLEcalciferoL (vitamin D3) (VITAMIN D3) 50 mcg (2,000 unit) tablet Take one tablet by mouth daily.   ? coenzyme Q10 100 mg cap Take one capsule by mouth three times weekly.   ? CRANBERRY FRUIT EXTRACT (CRANBERRY PO) Take 4,200 mg by mouth daily.   ? cyanocobalamin (vitamin B-12) 500 mcg tablet Take one tablet by mouth daily.   ? empagliflozin (JARDIANCE) 25 mg tablet Take one tablet by mouth daily.   ? evolocumab (REPATHA SURECLICK) 140 mg/mL injectable PEN Inject 1 mL under the skin every 14 days. (Patient not taking: Reported on 02/09/2022)   ? ezetimibe (ZETIA) 10 mg tablet TAKE 1 TABLET EVERY DAY   ? ferrous sulfate (FEOSOL) 325 mg (65 mg iron) tablet Take one tablet by mouth twice weekly. Take on an empty stomach at least 1 hour before or 2 hours after food.   ? furosemide (LASIX) 40 mg tablet TAKE ONE TABLET DAILY. MAY TAKE AN ADDITIONAL LASIX AS NEEDED (DOSE ADJUSTMENT)   ? glucosamine/chondro su A/C/Mn (GLUCOSAMINE-CHONDROITIN COMPLX PO) Take 1 tablet by mouth daily.   ? hydrALAZINE (APRESOLINE) 50 mg tablet TAKE 1 TABLET TWICE DAILY   ? levothyroxine (SYNTHROID) 175 mcg tablet Take one tablet by mouth daily 30 minutes before breakfast.   ? losartan (COZAAR) 100 mg tablet TAKE 1 TABLET EVERY DAY   ? metFORMIN (GLUCOPHAGE) 500 mg tablet Take one tablet by mouth twice daily with meals. Ok to resume 06/21/18   ? omeprazole DR(+) (PRILOSEC) 20 mg PO capsule Take one capsule by mouth daily.   ? rosuvastatin (CRESTOR) 40 mg tablet TAKE 1 TABLET EVERY DAY (Patient taking differently: Take one tablet by mouth daily.)   ? spironolactone (ALDACTONE) 25 mg tablet TAKE 1 TABLET EVERY DAY WITH FOOD   ? vitamins, multiple tablet Take one tablet by mouth every 48 hours.

## 2022-02-11 ENCOUNTER — Encounter: Admit: 2022-02-11 | Discharge: 2022-02-11 | Payer: MEDICARE

## 2022-02-20 ENCOUNTER — Inpatient Hospital Stay: Admit: 2022-02-20 | Discharge: 2022-02-20 | Payer: MEDICARE

## 2022-02-20 ENCOUNTER — Encounter: Admit: 2022-02-20 | Discharge: 2022-02-20 | Payer: MEDICARE

## 2022-02-20 MED ORDER — HEPARIN (PORCINE) 1,000 UNIT/ML IJ SOLN
INTRAVENOUS | 0 refills | Status: DC
Start: 2022-02-20 — End: 2022-02-20
  Administered 2022-02-20: 13:00:00 4000 [IU] via INTRAVENOUS
  Administered 2022-02-20: 13:00:00 8000 [IU] via INTRAVENOUS
  Administered 2022-02-20: 14:00:00 4000 [IU] via INTRAVENOUS
  Administered 2022-02-20: 14:00:00 3000 [IU] via INTRAVENOUS

## 2022-02-20 MED ORDER — FENTANYL CITRATE (PF) 50 MCG/ML IJ SOLN
INTRAVENOUS | 0 refills | Status: DC
Start: 2022-02-20 — End: 2022-02-20
  Administered 2022-02-20: 13:00:00 100 ug via INTRAVENOUS

## 2022-02-20 MED ORDER — PHENYLEPHRINE 80MCG/ML IN NS IV DRIP (DBL CONC)
INTRAVENOUS | 0 refills | Status: DC
Start: 2022-02-20 — End: 2022-02-20
  Administered 2022-02-20 (×2): .5 ug/kg/min via INTRAVENOUS

## 2022-02-20 MED ORDER — ARTIFICIAL TEARS (PF) SINGLE DOSE DROPS GROUP
OPHTHALMIC | 0 refills | Status: DC
Start: 2022-02-20 — End: 2022-02-20
  Administered 2022-02-20: 13:00:00 2 [drp] via OPHTHALMIC

## 2022-02-20 MED ORDER — SUGAMMADEX 100 MG/ML IV SOLN
INTRAVENOUS | 0 refills | Status: DC
Start: 2022-02-20 — End: 2022-02-20
  Administered 2022-02-20: 15:00:00 200 mg via INTRAVENOUS

## 2022-02-20 MED ORDER — PROPOFOL INJ 10 MG/ML IV VIAL
INTRAVENOUS | 0 refills | Status: DC
Start: 2022-02-20 — End: 2022-02-20
  Administered 2022-02-20: 13:00:00 60 mg via INTRAVENOUS

## 2022-02-20 MED ORDER — DEXAMETHASONE SODIUM PHOSPHATE 4 MG/ML IJ SOLN
INTRAVENOUS | 0 refills | Status: DC
Start: 2022-02-20 — End: 2022-02-20
  Administered 2022-02-20: 13:00:00 4 mg via INTRAVENOUS

## 2022-02-20 MED ORDER — LIDOCAINE (PF) 200 MG/10 ML (2 %) IJ SYRG
INTRAVENOUS | 0 refills | Status: DC
Start: 2022-02-20 — End: 2022-02-20
  Administered 2022-02-20: 13:00:00 100 mg via INTRAVENOUS

## 2022-02-20 MED ORDER — ONDANSETRON HCL (PF) 4 MG/2 ML IJ SOLN
INTRAVENOUS | 0 refills | Status: DC
Start: 2022-02-20 — End: 2022-02-20
  Administered 2022-02-20: 15:00:00 4 mg via INTRAVENOUS

## 2022-02-20 MED ORDER — ROCURONIUM 10 MG/ML IV SOLN
INTRAVENOUS | 0 refills | Status: DC
Start: 2022-02-20 — End: 2022-02-20
  Administered 2022-02-20: 13:00:00 50 mg via INTRAVENOUS

## 2022-02-20 MED ADMIN — BUPIVACAINE (PF) 0.25 % (2.5 MG/ML) IJ SOLN [87866]: 10 mL | @ 13:00:00 | Stop: 2022-02-20 | NDC 00409155918

## 2022-02-20 MED ADMIN — EUCALYPTUS-MENTHOL MM LOZG [83613]: 1 | ORAL | @ 16:00:00 | NDC 12546062970

## 2022-02-20 MED ADMIN — ASPIRIN 325 MG PO TAB [681]: 325 mg | ORAL | @ 12:00:00 | Stop: 2022-02-20 | NDC 00536105405

## 2022-02-20 MED ADMIN — SODIUM CHLORIDE 0.9 % IV SOLP [27838]: 1000.000 mL | INTRAVENOUS | @ 12:00:00 | Stop: 2022-02-22 | NDC 00338004904

## 2022-02-20 MED ADMIN — APIXABAN 5 MG PO TAB [315778]: 5 mg | ORAL | @ 20:00:00 | NDC 00003089431

## 2022-02-20 MED ADMIN — LOSARTAN 50 MG PO TAB [76938]: 100 mg | ORAL | @ 18:00:00 | NDC 65862020290

## 2022-02-20 MED ADMIN — CEFAZOLIN INJ 1GM IVP [210319]: 2 g | INTRAVENOUS | @ 13:00:00 | Stop: 2022-02-20 | NDC 00143992490

## 2022-02-20 MED ADMIN — AMLODIPINE 5 MG PO TAB [79041]: 5 mg | ORAL | @ 18:00:00 | NDC 00904637061

## 2022-02-20 MED ADMIN — ACETAMINOPHEN 325 MG PO TAB [101]: 650 mg | ORAL | @ 17:00:00 | NDC 00904677361

## 2022-02-20 MED ADMIN — EZETIMIBE 10 MG PO TAB [86887]: 10 mg | ORAL | @ 18:00:00 | NDC 67877049090

## 2022-02-21 ENCOUNTER — Encounter: Admit: 2022-02-21 | Discharge: 2022-02-21 | Payer: MEDICARE

## 2022-02-21 ENCOUNTER — Inpatient Hospital Stay: Admit: 2022-02-21 | Discharge: 2022-02-21 | Payer: MEDICARE

## 2022-02-21 DIAGNOSIS — I10 Essential (primary) hypertension: Secondary | ICD-10-CM

## 2022-02-21 DIAGNOSIS — R079 Chest pain, unspecified: Secondary | ICD-10-CM

## 2022-02-21 DIAGNOSIS — E039 Hypothyroidism, unspecified: Secondary | ICD-10-CM

## 2022-02-21 DIAGNOSIS — I480 Paroxysmal atrial fibrillation: Secondary | ICD-10-CM

## 2022-02-21 DIAGNOSIS — I2510 Atherosclerotic heart disease of native coronary artery without angina pectoris: Secondary | ICD-10-CM

## 2022-02-21 DIAGNOSIS — Z950 Presence of cardiac pacemaker: Secondary | ICD-10-CM

## 2022-02-21 DIAGNOSIS — I4891 Unspecified atrial fibrillation: Secondary | ICD-10-CM

## 2022-02-21 DIAGNOSIS — R9439 Abnormal result of other cardiovascular function study: Secondary | ICD-10-CM

## 2022-02-21 DIAGNOSIS — Z95818 Presence of other cardiac implants and grafts: Secondary | ICD-10-CM

## 2022-02-21 DIAGNOSIS — Z0189 Encounter for other specified special examinations: Secondary | ICD-10-CM

## 2022-02-21 DIAGNOSIS — J449 Chronic obstructive pulmonary disease, unspecified: Secondary | ICD-10-CM

## 2022-02-21 DIAGNOSIS — E7800 Pure hypercholesterolemia, unspecified: Secondary | ICD-10-CM

## 2022-02-21 MED ADMIN — APIXABAN 5 MG PO TAB [315778]: 5 mg | ORAL | @ 13:00:00 | Stop: 2022-02-21 | NDC 00003089431

## 2022-02-21 MED ADMIN — ROSUVASTATIN 20 MG PO TAB [88504]: 40 mg | ORAL | @ 13:00:00 | Stop: 2022-02-21 | NDC 68462026390

## 2022-02-21 MED ADMIN — HYDRALAZINE 10 MG PO TAB [3698]: 50 mg | ORAL | @ 02:00:00 | NDC 00904644061

## 2022-02-21 MED ADMIN — ALUMINUM-MAGNESIUM HYDROXIDE 200-200 MG/5 ML PO SUSP [92642]: 30 mL | ORAL | @ 11:00:00 | Stop: 2022-02-21 | NDC 00121176030

## 2022-02-21 MED ADMIN — HYDRALAZINE 10 MG PO TAB [3698]: 50 mg | ORAL | @ 13:00:00 | Stop: 2022-02-21 | NDC 00904644061

## 2022-02-21 MED ADMIN — LEVOTHYROXINE 175 MCG PO TAB [10406]: 175 ug | ORAL | @ 11:00:00 | Stop: 2022-02-21 | NDC 00904695761

## 2022-02-21 MED ADMIN — AMLODIPINE 5 MG PO TAB [79041]: 5 mg | ORAL | @ 02:00:00 | NDC 00904637061

## 2022-02-21 MED ADMIN — HYDRALAZINE 10 MG PO TAB [3698]: 50 mg | ORAL | @ 13:00:00 | Stop: 2022-02-21 | NDC 68084044711

## 2022-02-21 MED ADMIN — EZETIMIBE 10 MG PO TAB [86887]: 10 mg | ORAL | @ 13:00:00 | Stop: 2022-02-21 | NDC 67877049090

## 2022-02-21 MED ADMIN — LOSARTAN 50 MG PO TAB [76938]: 100 mg | ORAL | @ 13:00:00 | Stop: 2022-02-21 | NDC 65862020290

## 2022-02-21 MED ADMIN — DAPAGLIFLOZIN PROPANEDIOL 10 MG PO TAB [320172]: 10 mg | ORAL | @ 13:00:00 | Stop: 2022-02-21 | NDC 00310621030

## 2022-02-21 MED ADMIN — APIXABAN 5 MG PO TAB [315778]: 5 mg | ORAL | @ 02:00:00 | NDC 00003089431

## 2022-02-21 MED ADMIN — AMLODIPINE 5 MG PO TAB [79041]: 5 mg | ORAL | @ 13:00:00 | Stop: 2022-02-21 | NDC 00904637061

## 2022-02-21 NOTE — Telephone Encounter
RC to pt and left detailed msg to continue on ASA and Eliquis until 45 day f/u TEE on 10/4. At that time, if the Watchman looks good, then he will be switched to ASA and Plavix. Asked him to [facility] with further questions.

## 2022-02-21 NOTE — Telephone Encounter
-----   Message from [name], LPN sent at 3/90/3009  2:28 PM CDT -----  Regarding: ANO- post op med ?  VM on triage line from patient at 2:04pm.   He had the Watchman yesterday and was told that he would be starting Plavix and aspirin 81mg .  It is not on his discharge papers.  Is Plavix a script or over the counter?  Call him at #[phone].

## 2022-02-23 ENCOUNTER — Encounter: Admit: 2022-02-23 | Discharge: 2022-02-23 | Payer: MEDICARE

## 2022-02-23 NOTE — Telephone Encounter
Called patient and LVM with answers.  Advised to call me if any other questions.

## 2022-02-23 NOTE — Telephone Encounter
-----   Message from [name], LPN sent at 1/97/5883  9:29 AM CDT -----  Regarding: ANO- post op ?  VM on triage line from patient at 9:18am.  He had the Watchman procedure on 02-20-22.  Papers say he will have TEE on 04-05-22 is that correct?  Do I need to take antibiotics for it?  Call him at #[phone].

## 2022-02-25 ENCOUNTER — Encounter: Admit: 2022-02-25 | Discharge: 2022-02-25 | Payer: MEDICARE

## 2022-02-26 ENCOUNTER — Encounter: Admit: 2022-02-26 | Discharge: 2022-02-26 | Payer: MEDICARE

## 2022-02-26 MED ORDER — AMLODIPINE 5 MG PO TAB
5 mg | ORAL_TABLET | Freq: Two times a day (BID) | 0 refills
Start: 2022-02-26 — End: ?

## 2022-02-27 ENCOUNTER — Encounter: Admit: 2022-02-27 | Discharge: 2022-02-27 | Payer: MEDICARE

## 2022-02-27 NOTE — Telephone Encounter
Called patient to check on status and confirm f/u appts.  LM for patient to call.

## 2022-02-27 NOTE — Telephone Encounter
45 day post Watchman office visit and TEE (transesophageal echocardiogram)     Watchman implant completed 02/20/2022 with Dr. Dr. Milas Kocher.   Called patient to follow up with patient post implantation and schedule Watchman follow up appointment including TEE.    Assessment s/s of complications after LAAO (screening for bleeding event, neuro event, vascular complications, and delayed pericardial effusion).   Patient denies signs of bleeding.  Patient denies symptoms of stroke/TIA.  Patient denies swelling, bleeding or groin site pain.  Patient denies new chest pain or new shortness of breath.  Patient denies any hospitalizations or changes in OAC/ASA status since discharge from [facility] implant.     04/05/2022 at 11:00 am   Office Visit with Dorena Cookey APRN at the [facility]     04/05/2022 at 12:00 noon     (arrival time) TEE (transesophageal echocardiogram)  Location:   main campus, [address]  [place]    Nothing to eat or drink after midnight the night before.   You will receive a phone call 1-2 business days in advance with instructions for your medications.  You must have a driver home.      6 month post Watchman appointment:  08/17/2022 at 08:30 am Office Visit with Dr. Arna Medici at the [facility]     Device Related Thrombus reduction:  Advised patient that they are to continue to take the Eliquis and Aspirin until otherwise informed by Dr. Milas Kocher team.  It is imperative they continue to take these medications as prescribed to reduce the risk of device related thrombus.  If they have any questions or concerns they are to call the office at [phone] as soon as possible but not to make any medication changes without the physician's instructions.     Patient verbalized understanding of date/time/location of all above appointments.

## 2022-03-06 ENCOUNTER — Encounter: Admit: 2022-03-06 | Discharge: 2022-03-06 | Payer: MEDICARE

## 2022-03-10 ENCOUNTER — Encounter: Admit: 2022-03-10 | Discharge: 2022-03-10 | Payer: MEDICARE

## 2022-03-10 NOTE — Telephone Encounter
Patient requested Eliquis prescription be resent to [facility] in [place].  Called [facility] at [phone] to discuss with staff.  Proved prescription information over the phone but she wanted the script faxed to their office.  Faxed to [phone]

## 2022-03-13 ENCOUNTER — Encounter: Admit: 2022-03-13 | Discharge: 2022-03-13 | Payer: MEDICARE

## 2022-03-16 ENCOUNTER — Encounter: Admit: 2022-03-16 | Discharge: 2022-03-16 | Payer: MEDICARE

## 2022-03-16 DIAGNOSIS — I2510 Atherosclerotic heart disease of native coronary artery without angina pectoris: Secondary | ICD-10-CM

## 2022-03-16 DIAGNOSIS — R0989 Other specified symptoms and signs involving the circulatory and respiratory systems: Secondary | ICD-10-CM

## 2022-03-16 DIAGNOSIS — R072 Precordial pain: Secondary | ICD-10-CM

## 2022-03-16 DIAGNOSIS — E785 Hyperlipidemia, unspecified: Secondary | ICD-10-CM

## 2022-03-16 DIAGNOSIS — R011 Cardiac murmur, unspecified: Secondary | ICD-10-CM

## 2022-03-16 DIAGNOSIS — I10 Essential (primary) hypertension: Secondary | ICD-10-CM

## 2022-03-16 LAB — LIPID PROFILE
CHOLESTEROL/HDL %: 4
CHOLESTEROL: 145
HDL: 39 — ABNORMAL LOW (ref >=40)
LDL: 77
TRIGLYCERIDES: 148
VLDL: 30

## 2022-03-16 LAB — ALT (SGPT): ALT: 33

## 2022-03-20 ENCOUNTER — Encounter: Admit: 2022-03-20 | Discharge: 2022-03-20 | Payer: MEDICARE

## 2022-03-20 NOTE — Telephone Encounter
Results and recommendations called to patient.    Mason Lawson: Lipid profile looks stable. Thanks. SBG

## 2022-03-29 ENCOUNTER — Encounter: Admit: 2022-03-29 | Discharge: 2022-03-29 | Payer: MEDICARE

## 2022-03-29 NOTE — Progress Notes
Report Sheet    TEE         Indication: Post Watchman, 45 days (02/20/22)  Ordering Provider: Milas Kocher, MD    Name: Mason Lawson     Age: 76 y.o.    DOB: [birth year]     MRN: [id number]  Patient phone number: [phone]    Communication Barriers: none noted    Lab(s) needed: No open orders; Glucose  Other: APPT PRIOR FOR H&P    Device check needed: No  Device:   Lab Results   Component Value Date/Time    GENERATOR [employer] 02/15/2022 09:17 AM    EPDEVTYP IPG 02/15/2022 09:17 AM       Pt Called:   Arrival Time: 04/05/22 at 10:45am (clinic)  Driver Information:     Date of Last H&P: 02/09/22 Mason Lawson   Additional Information: UPDATE H&P:  11am with Mason Lawson    Prior TEE: 01/20/21    Prior DCCV: na     Prior Echo:02/21/22       Previous TEE/DCCV details:     EF:   ECHO EF   Date Value Ref Range Status   02/21/2022 65 % Final       Anticoag:Eliquis     Dose:5mg      Frequency:BID       Missed:  Labs   INR    HGB 15.5 (02/21/22)   Platelets 171   Glucose 159   NA 138   K 4.2   Creatinine 0.79   Other      Medications to HOLD day of:  ? Vitamins/Supplements  ? Jardiance  ? Lasix  ? Metformin  ? Spironolactone    Probe   Gastric Surgery    GERD Prilosec   Swallow difficulty    Head/Neck Surg/Rad    Chest Surg/Rad Pacemaker; Watchman; Ablation; CABG x3 2005   EGD    Varices/Esophag CA    GI bleed    Dental issues      Sedation   COPD Yes   OSA/CPAP    Asthma    Pulm HTN    Anesthesia Issues    Smoker Former, Quit 2005   ETOH & Frequency    Drug Use    Chronic Pain Med      Current Medications:   ? amLODIPine (NORVASC) 5 mg tablet TAKE 1 TABLET TWICE DAILY   ? apixaban (ELIQUIS) 5 mg tablet Take one tablet by mouth twice daily.   ? aspirin EC (ASPIR-LOW) 81 mg tablet Take one tablet by mouth daily. Indications: s/p Watchman   ? calcium carbonate (TUMS) 500 mg (200 mg elemental calcium) chewable tablet Chew one tablet by mouth daily as needed.   ? CHOLEcalciferoL (vitamin D3) (VITAMIN D3) 50 mcg (2,000 unit) tablet Take one tablet by mouth daily.   ? coenzyme Q10 100 mg cap Take one capsule by mouth three times weekly.   ? CRANBERRY FRUIT EXTRACT (CRANBERRY PO) Take 4,200 mg by mouth daily.   ? cyanocobalamin (vitamin B-12) 500 mcg tablet Take one tablet by mouth daily.   ? empagliflozin (JARDIANCE) 25 mg tablet Take one tablet by mouth daily.   ? evolocumab (REPATHA SURECLICK) 140 mg/mL injectable PEN Inject 1 mL under the skin every 14 days. (Patient not taking: Reported on 02/09/2022)   ? ezetimibe (ZETIA) 10 mg tablet TAKE 1 TABLET EVERY DAY   ? ferrous sulfate (FEOSOL) 325 mg (65 mg iron) tablet Take one tablet by mouth twice weekly. Take on an empty stomach  at least 1 hour before or 2 hours after food.   ? furosemide (LASIX) 40 mg tablet TAKE ONE TABLET DAILY. MAY TAKE AN ADDITIONAL LASIX AS NEEDED (DOSE ADJUSTMENT)   ? glucosamine/chondro su A/C/Mn (GLUCOSAMINE-CHONDROITIN COMPLX PO) Take 1 tablet by mouth daily.   ? hydrALAZINE (APRESOLINE) 50 mg tablet TAKE 1 TABLET TWICE DAILY   ? levothyroxine (SYNTHROID) 175 mcg tablet Take one tablet by mouth daily 30 minutes before breakfast.   ? losartan (COZAAR) 100 mg tablet TAKE 1 TABLET EVERY DAY   ? metFORMIN (GLUCOPHAGE) 500 mg tablet Take one tablet by mouth twice daily with meals. Ok to resume 06/21/18   ? omeprazole DR(+) (PRILOSEC) 20 mg PO capsule Take one capsule by mouth daily.   ? rosuvastatin (CRESTOR) 40 mg tablet TAKE 1 TABLET EVERY DAY (Patient taking differently: Take one tablet by mouth daily.)   ? spironolactone (ALDACTONE) 25 mg tablet TAKE 1 TABLET EVERY DAY WITH FOOD   ? vitamins, multiple tablet Take one tablet by mouth every 48 hours.       Other Pertinent HX:  Past Medical/Surgical History:   Patient Active Problem List    Diagnosis Date Noted   ? Presence of Watchman left atrial appendage closure device 02/21/2022   ? PAF (paroxysmal atrial fibrillation) (HCC) 02/20/2022   ? Cardiac pacemaker 05/16/2021   ? Atrial fibrillation (HCC) 05/13/2021   ? Bradycardia 02/01/2021   ? DM (diabetes mellitus) type II, controlled, with peripheral vascular disorder (HCC) 02/01/2021   ? Paroxysmal nocturnal dyspnea 02/01/2021   ? Atrial flutter (HCC) 01/19/2021   ? Chronic heart failure with preserved ejection fraction (HCC) 12/04/2018   ? Nonrheumatic aortic valve stenosis 06/06/2018   ? Dyslipidemia 10/23/2012   ? Chest pain 09/26/2010   ? Abnormal stress test 09/26/2010   ? Murmur 09/23/2010   ? Coronary artery disease    ? Hypercholesterolemia    ? Hypothyroidism    ? Hypertension    ? Tests ordered       Past Medical History:   Diagnosis Date   ? Abnormal stress test 09/26/2010   ? Atrial fibrillation (HCC) 05/13/2021   ? Cardiac pacemaker 05/16/2021   ? Chest pain 09/26/2010   ? COPD (chronic obstructive pulmonary disease) (HCC)    ? Coronary artery disease 06/23/2009   ? Hypercholesterolemia 06/23/2009   ? Hypertension 06/23/2009   ? Hypothyroidism 06/23/2009   ? 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 Milas Kocher; Left atrial appendage occlusion with 31 mm Watchman FLX device   ? Tests ordered 06/23/2009      Surgical History:   Procedure Laterality Date   ? HX CORONARY ARTERY BYPASS GRAFT  2005    x3 vessels   ? HEART CATHETERIZATION  2005   ? HEART CATHETERIZATION  2012   ? HEART CATHETERIZATION  2019   ? ANGIOGRAPHY CORONARY ARTERY WITH RIGHT AND LEFT HEART CATHETERIZATION N/A 06/18/2018    Performed by Mason Math, MD, Mason [facility] at [facility] CATH LAB   ? POSSIBLE PERCUTANEOUS CORONARY STENT PLACEMENT WITH ANGIOPLASTY N/A 06/18/2018    Performed by Mason Math, MD, [facility] Lawson at [facility] CATH LAB   ? ABLATION OF DYSRHYTHMIC FOCUS  01/21/2021   ? PACEMAKER INSERTION  01/21/2021   ? INSERTION/ REPLACEMENT PERMANENT PACEMAKER WITH ATRIAL AND VENTRICULAR LEAD Left 01/21/2021    Performed by Mason Childes, MD at [facility] EP LAB   ? INTRACARDIAC CATHETER ABLATION WITH COMPREHENSIVE ELECTROPHYSIOLOGIC  EVALUATION - TYPICAL FLUTTER 01/21/2021    Performed by Mason Mow, MD at [facility] EP LAB   ? PERCUTANEOUS CLOSURE LEFT ATRIAL APPENDAGE WITH ENDOCARDIAL IMPLANT, TRANSEPTAL CATHETERIZATION, FOLEY, CONTRAST ANTICIPATED N/A 02/20/2022    Performed by Annamarie Dawley, MD at [facility] EP LAB   ? INTRACARDIAC ECHOCARDIOGRAPHY  02/20/2022    Performed by Annamarie Dawley, MD at [facility] EP LAB       Allergies:   Allergies   Allergen Reactions   ? Penicillins NAUSEA ONLY   ? Percodan [Oxycodone Hcl-Oxycodone-Asa] NAUSEA AND VOMITING     Sharyon Medicus, RN

## 2022-03-30 ENCOUNTER — Encounter: Admit: 2022-03-30 | Discharge: 2022-03-30 | Payer: MEDICARE

## 2022-04-05 ENCOUNTER — Encounter: Admit: 2022-04-05 | Discharge: 2022-04-05 | Payer: MEDICARE

## 2022-04-05 ENCOUNTER — Ambulatory Visit: Admit: 2022-04-05 | Discharge: 2022-04-05 | Payer: MEDICARE

## 2022-04-05 DIAGNOSIS — I4891 Unspecified atrial fibrillation: Secondary | ICD-10-CM

## 2022-04-05 DIAGNOSIS — E785 Hyperlipidemia, unspecified: Secondary | ICD-10-CM

## 2022-04-05 DIAGNOSIS — I5032 Chronic diastolic (congestive) heart failure: Secondary | ICD-10-CM

## 2022-04-05 DIAGNOSIS — Z01810 Encounter for preprocedural cardiovascular examination: Secondary | ICD-10-CM

## 2022-04-05 DIAGNOSIS — E7800 Pure hypercholesterolemia, unspecified: Secondary | ICD-10-CM

## 2022-04-05 DIAGNOSIS — E039 Hypothyroidism, unspecified: Secondary | ICD-10-CM

## 2022-04-05 DIAGNOSIS — I350 Nonrheumatic aortic (valve) stenosis: Secondary | ICD-10-CM

## 2022-04-05 DIAGNOSIS — R079 Chest pain, unspecified: Secondary | ICD-10-CM

## 2022-04-05 DIAGNOSIS — I2510 Atherosclerotic heart disease of native coronary artery without angina pectoris: Secondary | ICD-10-CM

## 2022-04-05 DIAGNOSIS — I10 Essential (primary) hypertension: Secondary | ICD-10-CM

## 2022-04-05 DIAGNOSIS — Z950 Presence of cardiac pacemaker: Secondary | ICD-10-CM

## 2022-04-05 DIAGNOSIS — R9439 Abnormal result of other cardiovascular function study: Secondary | ICD-10-CM

## 2022-04-05 DIAGNOSIS — J449 Chronic obstructive pulmonary disease, unspecified: Secondary | ICD-10-CM

## 2022-04-05 DIAGNOSIS — Z95818 Presence of other cardiac implants and grafts: Secondary | ICD-10-CM

## 2022-04-05 DIAGNOSIS — I480 Paroxysmal atrial fibrillation: Secondary | ICD-10-CM

## 2022-04-05 DIAGNOSIS — Z0189 Encounter for other specified special examinations: Secondary | ICD-10-CM

## 2022-04-05 MED ORDER — LIDOCAINE (PF) 20 MG/ML (2 %) IJ SOLN
INTRAVENOUS | 0 refills | Status: DC
Start: 2022-04-05 — End: 2022-04-05

## 2022-04-05 MED ORDER — GLYCOPYRROLATE 0.2 MG/ML IJ SOLN
INTRAVENOUS | 0 refills | Status: DC
Start: 2022-04-05 — End: 2022-04-05

## 2022-04-05 MED ORDER — SODIUM CHLORIDE 0.9 % IV SOLP (OR) 500ML
INTRAVENOUS | 0 refills | Status: DC
Start: 2022-04-05 — End: 2022-04-05

## 2022-04-05 MED ORDER — ONDANSETRON IVPB
4 mg | Freq: Once | INTRAVENOUS | 0 refills | PRN
Start: 2022-04-05 — End: ?

## 2022-04-05 MED ORDER — PROPOFOL 10 MG/ML IV EMUL 20 ML (INFUSION)(AM)(OR)
INTRAVENOUS | 0 refills | Status: DC
Start: 2022-04-05 — End: 2022-04-05
  Administered 2022-04-05: 18:00:00 120 ug/kg/min via INTRAVENOUS

## 2022-04-05 NOTE — Patient Instructions
You will proceed with TEE.  IF there is no significant leak across the Watchman device they will notify you to stop Eliquis.  They will start you on Plavix which will last for about 4.5 months.  You will continue baby aspirin.  Once Plavix is complete you will continue baby aspirin alone indefinitely.

## 2022-04-05 NOTE — Progress Notes
Date of Service: 04/05/2022    Mason Lawson is a 76 y.o. male.       HPI     I had the pleasure of seeing Mason Lawson for 45 day follow up after Watchman implant prior to follow up TEE.  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  He was felt to be a poor candidate for long-term oral anticoagulation given his history of falls and was referred for consideration of watchman and felt to be a candidate.    He was admitted on 02/20/2022 and underwent successful watchman implant without complication.  He was discharged home on aspirin in addition to Eliquis 5 mg twice daily.  He presents today for 45-day for day follow-up TEE.  Today he reports no cardiac symptoms.  He denies progressive shortness of breath, orthopnea, PND, or lower extremity edema.  He denies chest pain, palpitations, or near syncope.  He notes occasional dizziness and lightheadedness with quick position changes.         Vitals:    04/05/22 1048   BP: 110/76   BP Source: Arm, Left Upper   Pulse: 72   PainSc: Zero   Weight: 97.1 kg (214 lb)   Height: 180.3 cm (5' 11)     Body mass index is 29.85 kg/m?[name]     Past Medical History  Patient Active Problem List    Diagnosis Date Noted   ? Presence of Watchman left atrial appendage closure device 02/21/2022     02/20/2022 - Dr. Milas Kocher; Left atrial appendage occlusion with 31 mm Watchman FLX device     ? PAF (paroxysmal atrial fibrillation) (HCC) 02/20/2022   ? 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 Derrell Lolling     ? Atrial fibrillation (HCC) 05/13/2021 ? Bradycardia 02/01/2021   ? DM (diabetes mellitus) type II, controlled, with peripheral vascular disorder (HCC) 02/01/2021   ? Paroxysmal nocturnal dyspnea 02/01/2021   ? Atrial flutter (HCC) 01/19/2021     01/21/2021 - AFL RFA with Dr. Wallene Huh:  Successful cavo-tricuspid isthmus ablation.  Comprehensive EP study with coronary sinus catheter placement.  Underlying severe sinus node dysfunction. No escape rhythm.     ? Chronic heart failure with preserved ejection fraction (HCC) 12/04/2018     RIGHT HEART CATHETERIZATION:  06/2018 in the setting of severe hypertension  HEMODYNAMICS:   Blood pressure 198/81 with a mean of 130 mmHg. Heart rate 50 beats per minute.   PRESSURES:  1. The right atrial pressure was 4 mmHg. Right ventricular pressure was 64/8 mmHg.  2. The pulmonary artery pressure was 60/70 mmHg with a mean of 32 mmHg. The pulmonary capillary wedge pressure was 17 mmHg. The transpulmonary gradient was 15 mmHg.  3. The diastolic pulmonary gradient was 0 mmHg. The pulmonary vascular resistance was 2 Wood units.   RIGHT SATURATIONS: The right atrial saturation 77%. The pulmonary artery saturation was 75%. The aortic saturation was 96%.   OUTPUTS: The cardiac output by Fick method was 5.8 L/minute with a cardiac index of 2.7 L/minute per sq m. The cardiac index by thermodilution was 7.3 with a cardiac index of 3.4 L/minute per sq m.     ?  Nonrheumatic aortic valve stenosis 06/06/2018     07/25/2021 - ECHO:  A septal wall motion abnormality is noted consistent with prior cardiac surgery. ?No other regional wall motion abnormalities are noted. Overall left ventricular systolic function appears normal and dynamic. The estimated left ventricular ejection fraction is 65%.  There is mild concentric left ventricular hypertrophy.  Mild right ventricular enlargement with normal right ventricular systolic function. Mild left atrial enlargement.  Moderate aortic valve stenosis.  No pericardial effusion is seen.     ? Dyslipidemia 10/23/2012   ? Chest pain 09/26/2010   ? Abnormal stress test 09/26/2010   ? Murmur 09/23/2010   ? Coronary artery disease      A. Onset mid 2004, progression fall 2005, patient reduces activity at work to avoid symptoms  B. 12/05 Progressive angina, Dr. Duanne Limerick, then 06/29/04 CBP Atch OV, referred for cath          >12/29 Cath 100%RCA, 75%LAD&Cx,50%LM EF 55%          >07/01/05 CABGx3 LIMA-LAD w/ Y-RIMA-OM, Rad-PDA per Dr. Ky Barban  C. 06/29/08 exercise echocardiographic images demonstrated basal septal ischemia, significant hypertensive response to exercise, good and adequate exercise tolerance with no angina.  D. LHC 06/2018 for new heart failure symptoms: LEFT HEART CATHETERIZATION:  HEMODYNAMICS:  The aortic pressure was- 195/63 with a mean of 105 mmHg .  The left ventricular systolic pressure was 210 mmHg.  There was a peak to peak gradient of 15 mmHg.  1. Left main coronary artery:  Left main coronary artery arises normally from the left coronary sinus.  The left main coronary artery bifurcates into left anterior descending and left circumflex artery and is free of angiographically significant disease.  2. Left anterior descending:  Left anterior descending is a large vessel which gives rise to- 3 diagonal vessels. 1 diagonal has a 80% ostial stenosis, the rest of diagonal vessels do not appear to be angiographically significant. Distal LAD has competitive flow from the LIMA.   3. Left circumflex artery:  The left circumflex artery appears to be medium caliber and has a 100% stenosis proximally. The mid-to-distal portion of the vessel fills from bridging collaterals from proximal portion and is diffusely disease. Three obtuse marginal branches arise, which are also diffusely diseased.   4. Right coronary artery:  The right coronary artery is a medium caliber vessel, but has 100% chronic total occlusion after the takeoff of conus branch. The PDA fills retrogradely from the radial graft to supply the PLV and has mild plaquing in the native PDA.    BYPASS GRAFT ANATOMY:  1.The LIMA to LAD was visualized using IM catheter.  The ostium, body was noted to be free of any angiographic disease, but has a 80% stenosis at the anastamosis with diffusely diseased distal LAD. There is a Y jump graft from the LIMA to the OM branch which fills retrogradely.            2. The radial artery graft to the PDA appears to be without any angiographically significant disease at ostium, body or anastomotic site.   E. Regadenosom MPI 11/2018 TID Ratio:  1.05  (normal <1.36). Summed Stress Score:  3   , Summed Rest Score:  0 Quantitative polar maps show a statistically insignificant amount of inferoapical ischemia. Mild inferobasilar septal hypokinesis.       Left Ventricular Ejection Fraction (post stress, in the resting state) =? 52 %.  Left Ventricular End Diastolic Volume: 914 mL  ?  SUMMARY/OPINION:?? This study is borderline abnormal with limited reversible uptake in the inferior wall.  All segments are viable.  Global left ventricular function is within normal limits. In aggregate the current study is low risk in regards to predicted annual cardiovascular mortality rate.  ?     ? Hypercholesterolemia      A.  02/28/01 total 324 total 249 HDL 43 LDL 231 Pt deferred meds  B. 06/29/04 total 273 trig 193 HDL 36 LDLd206 Zocor begun dc'd by pt d/t statinphobia and cost  C.  2/24 Rx Vytorin 10/80 1/2 tab.     ? Hypothyroidism      A.  2004 presentation with TSH over 200, Rx w l-thyroxine     ? Hypertension      A.  2/06 Altace 5 dc'd w/ dysgeusia, Metoprolol 25 BID begun        ? Tests ordered      A. 07/17/08 carotid artery duplex scan: no significant stenosis           Review of Systems   Constitutional: Negative.   HENT: Negative.    Eyes: Negative.    Cardiovascular: Negative.    Respiratory: Negative.    Endocrine: Negative.    Hematologic/Lymphatic: Negative.    Skin: Negative.    Musculoskeletal: Negative. Gastrointestinal: Negative.    Genitourinary: Negative.    Neurological: Negative.    Psychiatric/Behavioral: Negative.    All other systems reviewed and are negative.      Physical Exam  General Appearance: no acute distress  Skin: warm & intact  HEENT: unremarkable  Neck Veins: neck veins are flat & not distended  Chest Inspection: chest is normal in appearance  Auscultation/Percussion: lungs clear to auscultation, no rales, rhonchi, or wheezing  Cardiac Rhythm: regular rhythm & normal rate  Cardiac Auscultation: Normal S1 & S2, no S3 or S4, no rub  Murmurs: no cardiac murmurs   Extremities: no lower extremity edema  Abdominal Exam: soft, non-tender, no masses, bowel sounds normal  Liver & Spleen: no organomegaly  Neurologic Exam: oriented to time, place and person; no focal neurologic deficits  Psychiatric: Normal mood and affect.  Behavior is normal. Judgment and thought content normal.         Cardiovascular Studies  Preliminary EKG: AV paced, ventricular rate 72    Cardiovascular Health Factors  Vitals BP Readings from Last 3 Encounters:   04/05/22 110/76   02/21/22 137/53   02/09/22 139/74     Wt Readings from Last 3 Encounters:   04/05/22 97.1 kg (214 lb)   02/21/22 98.4 kg (216 lb 14.9 oz)   02/09/22 98.9 kg (218 lb)     BMI Readings from Last 3 Encounters:   04/05/22 29.85 kg/m?   02/21/22 30.26 kg/m?   02/09/22 30.40 kg/m?      Smoking Social History     Tobacco Use   Smoking Status Former   ? Packs/day: 1.00   ? Years: 40.00   ? Additional pack years: 0.00   ? Total pack years: 40.00   ? Types: Cigarettes   ? Quit date: 06/02/2004   ? Years since quitting: 17.8   Smokeless Tobacco Never      Lipid Profile Cholesterol   Date Value Ref Range Status   03/16/2022 145  Final     HDL   Date Value Ref Range Status   03/16/2022 39 (L) >=40 Final     LDL   Date Value Ref Range Status  03/16/2022 77  Final     Triglycerides   Date Value Ref Range Status   03/16/2022 148  Final      Blood Sugar Hemoglobin A1C Date Value Ref Range Status   04/12/2021 6.8 (H) <5.6 Final     Glucose   Date Value Ref Range Status   02/21/2022 159 (H) 70 - 100 MG/DL Final   16/04/9603 540 (H) 70 - 100 MG/DL Final   98/05/9146 829 (H) 70 - 105 Final   07/04/2004 121 (H) 70 - 110 MG/DL Final   56/21/3086 578 (H) 70 - 110 MG/DL Final   46/96/2952 841 (H) 70 - 110 MG/DL Final     Glucose, POC   Date Value Ref Range Status   02/20/2022 118 (H) 70 - 100 MG/DL Final   32/44/0102 725 (H) 70 - 100 MG/DL Final   36/64/4034 742 (H) 70 - 100 MG/DL Final          Problems Addressed Today  Encounter Diagnoses   Name Primary?   ? PAF (paroxysmal atrial fibrillation) (HCC) Yes   ? Presence of Watchman left atrial appendage closure device    ? Cardiac pacemaker    ? Dyslipidemia    ? Chronic heart failure with preserved ejection fraction (HCC)    ? Nonrheumatic aortic valve stenosis    ? Hypercholesterolemia    ? Coronary artery disease due to lipid rich plaque    ? Preop cardiovascular exam        Assessment and Plan     Paroxysmal atrial fibrillation/atrial flutter  Status post watchman implant given his history of falls.  He presents today for 45-day follow-up TEE.  He is aware that if there is no significant leak or thrombus on the Watchman device we will plan to discontinue Eliquis and start Plavix.  He should continue Plavix for approximately 4.5 months and aspirin indefinitely.    Left atrial appendage occlusion pre and post documentation (45 days, 6 months, 1 year and 2 years):  ? Modified Rankin Scale: 0  ? Barthel Index Score: 100  ? NYHA class: 0  ? HAS-BLED score: HTN (1), Elderly >65 (1) and Current Antiplatelet or NSAID use (1)  -     HAS-BLED Yearly Risk: 3=3.74%  ?  -     CHA2DS2-VASc Score: HTN (1), DM (1), >_ 75 (2) and Prior MI, PAD, or Aortic Plaque (1)  -     CHA2DSVASc Yearly Stroke Risk: 4=4%    Sinus node dysfunction  He has a dual-chamber pacemaker in place    Chronic diastolic heart failure  He appears well compensated today on exam.    Moderate aortic stenosis    Coronary artery disease  History of CABG.    Obstructive sleep apnea  He is on CPAP therapy.    Diabetes mellitus, type II    I appreciate the opportunity to participate in his care.    Dorena Cookey, APRN  Pager [phone]           Current Medications (including today's revisions)  ? amLODIPine (NORVASC) 5 mg tablet TAKE 1 TABLET TWICE DAILY   ? apixaban (ELIQUIS) 5 mg tablet Take one tablet by mouth twice daily.   ? aspirin EC (ASPIR-LOW) 81 mg tablet Take one tablet by mouth daily. Indications: s/p Watchman   ? calcium carbonate (TUMS) 500 mg (200 mg elemental calcium) chewable tablet Chew one tablet by mouth daily as needed.   ? CHOLEcalciferoL (vitamin D3) (VITAMIN D3) 50  mcg (2,000 unit) tablet Take one tablet by mouth daily.   ? coenzyme Q10 100 mg cap Take one capsule by mouth three times weekly.   ? CRANBERRY FRUIT EXTRACT (CRANBERRY PO) Take 4,200 mg by mouth daily.   ? cyanocobalamin (vitamin B-12) 500 mcg tablet Take one tablet by mouth daily.   ? empagliflozin (JARDIANCE) 25 mg tablet Take one tablet by mouth daily.   ? evolocumab (REPATHA SURECLICK) 140 mg/mL injectable PEN Inject 1 mL under the skin every 14 days. (Patient not taking: Reported on 02/09/2022)   ? ezetimibe (ZETIA) 10 mg tablet TAKE 1 TABLET EVERY DAY   ? ferrous sulfate (FEOSOL) 325 mg (65 mg iron) tablet Take one tablet by mouth twice weekly. Take on an empty stomach at least 1 hour before or 2 hours after food.   ? furosemide (LASIX) 40 mg tablet TAKE ONE TABLET DAILY. MAY TAKE AN ADDITIONAL LASIX AS NEEDED (DOSE ADJUSTMENT)   ? glucosamine/chondro su A/C/Mn (GLUCOSAMINE-CHONDROITIN COMPLX PO) Take 1 tablet by mouth daily.   ? hydrALAZINE (APRESOLINE) 50 mg tablet TAKE 1 TABLET TWICE DAILY   ? levothyroxine (SYNTHROID) 175 mcg tablet Take one tablet by mouth daily 30 minutes before breakfast.   ? losartan (COZAAR) 100 mg tablet TAKE 1 TABLET EVERY DAY   ? metFORMIN (GLUCOPHAGE) 500 mg tablet Take one tablet by mouth twice daily with meals. Ok to resume 06/21/18   ? omeprazole DR(+) (PRILOSEC) 20 mg PO capsule Take one capsule by mouth daily.   ? rosuvastatin (CRESTOR) 40 mg tablet TAKE 1 TABLET EVERY DAY (Patient taking differently: Take one tablet by mouth daily.)   ? spironolactone (ALDACTONE) 25 mg tablet TAKE 1 TABLET EVERY DAY WITH FOOD   ? vitamins, multiple tablet Take one tablet by mouth every 48 hours.

## 2022-04-05 NOTE — Patient Instructions
CARDIOLOGY PROCEDURES           POST SEDATION INSTRUCTIONS      Patient Name: Mason Lawson  MRN#: [id number]  Date: 04/05/2022      Please follow the instructions listed below:     Resume diet as prescribed     The following day you may experience a minor sore throat.     Please have someone accompany you, as YOU SHOULD NOT drive or operate machinery for at least 12-24 hours following the procedure.    There may be some residual effects from the sedatives during the procedure.  Do not drive a vehicle for up to 24 hours after receiving sedation.  Do not operate heavy or potentially harmful equipment  Do not make legally binding decisions  Do not drink alcohol for up to 24 hours  Do not communicate through social media for 24 hours.       If you have question or concerns about this procedure, please contact the Cardiology office at [phone], and ask to speak to one of the nurses.      Current Medications List:   amLODIPine (NORVASC) 5 mg tablet TAKE 1 TABLET TWICE DAILY    apixaban (ELIQUIS) 5 mg tablet Take one tablet by mouth twice daily.    aspirin EC (ASPIR-LOW) 81 mg tablet Take one tablet by mouth daily. Indications: s/p Watchman    calcium carbonate (TUMS) 500 mg (200 mg elemental calcium) chewable tablet Chew one tablet by mouth daily as needed.    CHOLEcalciferoL (vitamin D3) (VITAMIN D3) 50 mcg (2,000 unit) tablet Take one tablet by mouth daily.    coenzyme Q10 100 mg cap Take one capsule by mouth three times weekly.    CRANBERRY FRUIT EXTRACT (CRANBERRY PO) Take 4,200 mg by mouth daily.    cyanocobalamin (vitamin B-12) 500 mcg tablet Take one tablet by mouth daily.    empagliflozin (JARDIANCE) 25 mg tablet Take one tablet by mouth daily.    evolocumab (REPATHA SURECLICK) 140 mg/mL injectable PEN Inject 1 mL under the skin every 14 days. (Patient not taking: Reported on 02/09/2022)    ezetimibe (ZETIA) 10 mg tablet TAKE 1 TABLET EVERY DAY    ferrous sulfate (FEOSOL) 325 mg (65 mg iron) tablet Take one tablet by mouth twice weekly. Take on an empty stomach at least 1 hour before or 2 hours after food.    furosemide (LASIX) 40 mg tablet TAKE ONE TABLET DAILY. MAY TAKE AN ADDITIONAL LASIX AS NEEDED (DOSE ADJUSTMENT)    glucosamine/chondro su A/C/Mn (GLUCOSAMINE-CHONDROITIN COMPLX PO) Take 1 tablet by mouth daily.    hydrALAZINE (APRESOLINE) 50 mg tablet TAKE 1 TABLET TWICE DAILY    levothyroxine (SYNTHROID) 175 mcg tablet Take one tablet by mouth daily 30 minutes before breakfast.    losartan (COZAAR) 100 mg tablet TAKE 1 TABLET EVERY DAY    metFORMIN (GLUCOPHAGE) 500 mg tablet Take one tablet by mouth twice daily with meals. Ok to resume 06/21/18    omeprazole DR(+) (PRILOSEC) 20 mg PO capsule Take one capsule by mouth daily.    rosuvastatin (CRESTOR) 40 mg tablet TAKE 1 TABLET EVERY DAY (Patient taking differently: Take one tablet by mouth daily.)    spironolactone (ALDACTONE) 25 mg tablet TAKE 1 TABLET EVERY DAY WITH FOOD    vitamins, multiple tablet Take one tablet by mouth every 48 hours.         Instructions Given To: patient and family    Instructions Given By: Fleet Contras  Lance Bosch, RN

## 2022-04-05 NOTE — Anesthesia Post-Procedure Evaluation
Post-Anesthesia Evaluation    Name: Mason Lawson      MRN: [id number]     DOB: May 28, 1946     Age: 76 y.o.     Sex: male   __________________________________________________________________________     Procedure Information     Anesthesia Start Date/Time: 04/05/22 1306    Scheduled providers: Maureen Ralphs, MD; Naida Sleight, RN    Procedure: TRANSESOPHAGEAL ECHO    Location: Cardi[facility]: [facility]  BP: 127/75 (10/04 1350)  Pulse: 60 (10/04 1350)  Respirations: 17 PER MINUTE (10/04 1350)  SpO2: 96 % (10/04 1350)  O2 Device: None (Room air) (10/04 1350)  Height: 180 cm (5' 10.87") (10/04 1332) No vitals data found for the desired time range.        Post Anesthesia Evaluation Note    Evaluation location: Pre/Post  Patient participation: recovered; patient participated in evaluation  Level of consciousness: alert    Pain score: 0  Pain management: adequate    Hydration: normovolemia  Temperature: 36.0C - 38.4C  Airway patency: adequate    Perioperative Events       Post-op nausea and vomiting: no PONV    Postoperative Status  Cardiovascular status: hemodynamically stable  Respiratory status: spontaneous ventilation  Follow-up needed: none  Additional comments: Post-Anesthesia Evaluation Attestation: I reviewed and agree the indicated post-anesthesia care was provided. I have reviewed key portions of the indicated post anesthesia care. I have examined the patient's vitals, physical status, and complications and agree with what is documented.    Staff name:  Maureen Ralphs, MD Date:  04/05/2022          Perioperative Events  There were no known notable events for this encounter.

## 2022-04-06 ENCOUNTER — Encounter: Admit: 2022-04-06 | Discharge: 2022-04-06 | Payer: MEDICARE

## 2022-04-06 MED ORDER — CLOPIDOGREL 75 MG PO TAB
75 mg | ORAL_TABLET | Freq: Every day | ORAL | 1 refills | 90.00000 days | Status: AC
Start: 2022-04-06 — End: ?

## 2022-04-06 NOTE — Telephone Encounter
-----   Message from Hulan Fess, BSN sent at 02/27/2022  3:31 PM CDT -----  Regarding: ANO post Watchman med mgmt  Please call the patient to advise of medication changes after the post Watchman TEE has resulted.  Thanks

## 2022-04-06 NOTE — Telephone Encounter
ANO- RC at 9:45 and 9:50am for [place]. Call him at [phone].    RC to pt. Reviewed TEE results and ANO recs to switch to DAPT.   Pt will STOP eliquis, START plavix 75mg  daily and CONTINUE 81mg  ASA for life. Pt verbalized understanding and agreement to plan.     He does note worse GERD with eliquis and is questioning if plavix will be the same. Explained that they work differently in the body but both could be irritating to underlying GERD issues. He does take prilosec daily and tums and needed. Does not see GI and does not want to. Reviewed also that plavix should be most likely DC in 6 months per protocol.

## 2022-04-06 NOTE — Telephone Encounter
Delray Alt, MD  P [facility] Nurse Ep Team D  Watchman looks good. Switch to DAPT    Attempted to call pt to review TEE results and med changes. LM for RC.

## 2022-05-10 ENCOUNTER — Encounter: Admit: 2022-05-10 | Discharge: 2022-05-10 | Payer: MEDICARE

## 2022-05-10 NOTE — Telephone Encounter
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 The longest episode lasted ~16 seconds on 03/26/22. Please see full remote report for further detail and follow up as needed. Thank you!

## 2022-05-10 NOTE — Telephone Encounter
Called and left message requesting callback to check on symptoms.    Will route to [facility] for recommendations.

## 2022-05-11 ENCOUNTER — Encounter: Admit: 2022-05-11 | Discharge: 2022-05-11 | Payer: MEDICARE

## 2022-05-11 NOTE — Telephone Encounter
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  I recommend that he obtain a regadenoson thallium stress test to assess for objective evidence of myocardial ischemia and then see one of the cardiac arrhythmia specialists.  Please schedule the regadenoson thallium stress test and the clinic visit with EP.  He has seen cardiac arrhythmia (EP) before for placement of the Watchman device.  Thanks.  SBG

## 2022-05-19 ENCOUNTER — Encounter: Admit: 2022-05-19 | Discharge: 2022-05-19 | Payer: MEDICARE

## 2022-05-19 NOTE — Telephone Encounter
Patient is scheduled to have stress test completed in the [facility]. He declines to come to [facility] for his testing and is insistent to have testing completed at [facility] in [place]. Explained to patient benefits of having testing with [facility] facility and Dr. Arna Medici prefers to have testing done at [facility] facility.

## 2022-05-22 ENCOUNTER — Encounter: Admit: 2022-05-22 | Discharge: 2022-05-22 | Payer: MEDICARE

## 2022-05-28 ENCOUNTER — Encounter: Admit: 2022-05-28 | Discharge: 2022-05-28 | Payer: MEDICARE

## 2022-05-28 NOTE — Progress Notes
Received notification that  [employer] Carelink has not been connected since 05/10/22. Patient was instructed to look at his/her transmitter to make sure that it is plugged into power and send a manual transmission to reconnect the transmitter. If he/she has any questions about how to send a transmission or if the transmitter does not appear to be working properly, they need to contact the device company directly. Patient was provided with that contact number. Requested the patient send us a MyChart message or contact our device nurses at [phone] to let us know after they have sent their transmission. Mychart sent to pt. BC      Note: Patient needs to send a manual remote interrogation to reestablish communication to his/her remote transmitter.

## 2022-05-31 ENCOUNTER — Encounter: Admit: 2022-05-31 | Discharge: 2022-05-31 | Payer: MEDICARE

## 2022-06-01 ENCOUNTER — Encounter: Admit: 2022-06-01 | Discharge: 2022-06-01 | Payer: MEDICARE

## 2022-06-01 ENCOUNTER — Ambulatory Visit: Admit: 2022-06-01 | Discharge: 2022-06-01 | Payer: MEDICARE

## 2022-06-01 DIAGNOSIS — E785 Hyperlipidemia, unspecified: Secondary | ICD-10-CM

## 2022-06-01 DIAGNOSIS — I2510 Atherosclerotic heart disease of native coronary artery without angina pectoris: Secondary | ICD-10-CM

## 2022-06-05 ENCOUNTER — Encounter: Admit: 2022-06-05 | Discharge: 2022-06-05 | Payer: MEDICARE

## 2022-06-05 DIAGNOSIS — I10 Essential (primary) hypertension: Secondary | ICD-10-CM

## 2022-06-05 DIAGNOSIS — I2510 Atherosclerotic heart disease of native coronary artery without angina pectoris: Secondary | ICD-10-CM

## 2022-06-05 DIAGNOSIS — E7800 Pure hypercholesterolemia, unspecified: Secondary | ICD-10-CM

## 2022-06-05 NOTE — Telephone Encounter
-----   Message from Hester Mates, MD sent at 06/02/2022  5:17 PM CST -----  Cletis [place]: Both Dr. Milas Kocher and I have seen Mr. Riner previously. Please tell Mr. Kobler that his stress test is abnormal. Please review with him. The stress test was ordered because he was having episodes of non-sustained V-tach on his pacemaker interrogation. I am recommending that he proceed with coronary angiography/intervention based upon the results of his stress test. Please schedule him for a clinic visit as soon as possible to discuss the results with him in detail and to obtain an updated history and physical. Scheduling with myself or Dr. Milas Kocher is preferable but I am not in [place] until 06/29/22 and I recommend that he be seen before that. Please try to schedule him for a clinic visit and coronary angiography/intervention as soon as possible. Amit: Please let me know if you have any additional thoughts. Thanks to all: Kyung Bacca

## 2022-06-06 ENCOUNTER — Encounter: Admit: 2022-06-06 | Discharge: 2022-06-06 | Payer: MEDICARE

## 2022-06-06 DIAGNOSIS — R9439 Abnormal result of other cardiovascular function study: Secondary | ICD-10-CM

## 2022-06-06 DIAGNOSIS — I10 Essential (primary) hypertension: Secondary | ICD-10-CM

## 2022-06-06 DIAGNOSIS — I209 Angina pectoris, unspecified: Secondary | ICD-10-CM

## 2022-06-06 DIAGNOSIS — E7800 Pure hypercholesterolemia, unspecified: Secondary | ICD-10-CM

## 2022-06-06 DIAGNOSIS — Z0189 Encounter for other specified special examinations: Secondary | ICD-10-CM

## 2022-06-06 DIAGNOSIS — J449 Chronic obstructive pulmonary disease, unspecified: Secondary | ICD-10-CM

## 2022-06-06 DIAGNOSIS — I2510 Atherosclerotic heart disease of native coronary artery without angina pectoris: Secondary | ICD-10-CM

## 2022-06-06 DIAGNOSIS — I480 Paroxysmal atrial fibrillation: Secondary | ICD-10-CM

## 2022-06-06 DIAGNOSIS — E039 Hypothyroidism, unspecified: Secondary | ICD-10-CM

## 2022-06-06 DIAGNOSIS — I4891 Unspecified atrial fibrillation: Secondary | ICD-10-CM

## 2022-06-06 DIAGNOSIS — E1151 Type 2 diabetes mellitus with diabetic peripheral angiopathy without gangrene: Secondary | ICD-10-CM

## 2022-06-06 DIAGNOSIS — I483 Typical atrial flutter: Secondary | ICD-10-CM

## 2022-06-06 DIAGNOSIS — Z95818 Presence of other cardiac implants and grafts: Secondary | ICD-10-CM

## 2022-06-06 DIAGNOSIS — R079 Chest pain, unspecified: Secondary | ICD-10-CM

## 2022-06-06 DIAGNOSIS — Z136 Encounter for screening for cardiovascular disorders: Secondary | ICD-10-CM

## 2022-06-06 DIAGNOSIS — Z950 Presence of cardiac pacemaker: Secondary | ICD-10-CM

## 2022-06-06 MED ORDER — ASPIRIN 325 MG PO TAB
325 mg | Freq: Once | ORAL | 0 refills
Start: 2022-06-06 — End: ?

## 2022-06-12 ENCOUNTER — Encounter: Admit: 2022-06-12 | Discharge: 2022-06-12 | Payer: MEDICARE

## 2022-06-12 DIAGNOSIS — E1151 Type 2 diabetes mellitus with diabetic peripheral angiopathy without gangrene: Secondary | ICD-10-CM

## 2022-06-12 DIAGNOSIS — I480 Paroxysmal atrial fibrillation: Secondary | ICD-10-CM

## 2022-06-12 DIAGNOSIS — E7800 Pure hypercholesterolemia, unspecified: Secondary | ICD-10-CM

## 2022-06-12 DIAGNOSIS — I2510 Atherosclerotic heart disease of native coronary artery without angina pectoris: Secondary | ICD-10-CM

## 2022-06-12 DIAGNOSIS — I483 Typical atrial flutter: Secondary | ICD-10-CM

## 2022-06-12 DIAGNOSIS — I10 Essential (primary) hypertension: Secondary | ICD-10-CM

## 2022-06-12 LAB — BASIC METABOLIC PANEL
ANION GAP: 9
BLD UREA NITROGEN: 24 mL/min (ref >60)
CALCIUM: 9.7
CHLORIDE: 106 U/L (ref 7–56)
GLUCOSE,PANEL: 206 — ABNORMAL HIGH (ref 70–105)
POTASSIUM: 4.3 MMOL/L — ABNORMAL HIGH (ref 40.1–51.0)
SODIUM: 136 U/L (ref 7–40)

## 2022-06-12 LAB — CBC: WBC COUNT: 10 g/dL — ABNORMAL HIGH (ref 4.23–9.07)

## 2022-06-13 ENCOUNTER — Encounter: Admit: 2022-06-13 | Discharge: 2022-06-13 | Payer: MEDICARE

## 2022-06-13 NOTE — Progress Notes
12-12  Approval [employer]   For cath [id number] Approved valid from 12-21 to 07-23-2022  Authorization #[id number]  Tracking #[id number]    confirmed benefits and eligibility:  Current and active since 07-03-18, $ 0 deductible with required   co-insurance of 0% to max OOP [phone] then plan will pay 100% of allowable charges.

## 2022-06-17 ENCOUNTER — Encounter: Admit: 2022-06-17 | Discharge: 2022-06-17 | Payer: MEDICARE

## 2022-06-17 DIAGNOSIS — I350 Nonrheumatic aortic (valve) stenosis: Secondary | ICD-10-CM

## 2022-06-17 MED ORDER — EZETIMIBE 10 MG PO TAB
ORAL_TABLET | 3 refills
Start: 2022-06-17 — End: ?

## 2022-06-22 ENCOUNTER — Encounter: Admit: 2022-06-22 | Discharge: 2022-06-22 | Payer: MEDICARE

## 2022-06-22 MED ADMIN — DAPAGLIFLOZIN PROPANEDIOL 10 MG PO TAB [320172]: 10 mg | ORAL | @ 21:00:00 | NDC 00310621030

## 2022-06-22 MED ADMIN — ASPIRIN 325 MG PO TAB [681]: 325 mg | ORAL | @ 14:00:00 | Stop: 2022-06-22 | NDC 00536105405

## 2022-06-22 MED ADMIN — EZETIMIBE 10 MG PO TAB [86887]: 10 mg | ORAL | @ 19:00:00 | NDC 67877049090

## 2022-06-22 MED ADMIN — AMLODIPINE 5 MG PO TAB [79041]: 5 mg | ORAL | @ 19:00:00 | NDC 00904637061

## 2022-06-22 MED ADMIN — SODIUM CHLORIDE 0.9 % IV SOLP [27838]: 250 mL | INTRAVENOUS | @ 14:00:00 | Stop: 2022-06-22 | NDC 00338004904

## 2022-06-22 MED ADMIN — ROSUVASTATIN 20 MG PO TAB [88504]: 40 mg | ORAL | @ 19:00:00 | NDC 68462026390

## 2022-06-23 MED ADMIN — AMLODIPINE 5 MG PO TAB [79041]: 5 mg | ORAL | @ 03:00:00 | NDC 00904637061

## 2022-06-23 MED ADMIN — FUROSEMIDE 40 MG PO TAB [3295]: 40 mg | ORAL | @ 15:00:00 | Stop: 2022-06-23 | NDC 00904717861

## 2022-06-23 MED ADMIN — LOSARTAN 50 MG PO TAB [76938]: 100 mg | ORAL | @ 15:00:00 | Stop: 2022-06-23 | NDC 65862020290

## 2022-06-23 MED ADMIN — PANTOPRAZOLE 40 MG PO TBEC [80436]: 40 mg | ORAL | @ 03:00:00 | NDC 65862056099

## 2022-06-23 MED ADMIN — PRASUGREL 10 MG PO TAB [173868]: 60 mg | ORAL | @ 15:00:00 | Stop: 2022-06-23 | NDC 60505464303

## 2022-06-23 MED ADMIN — SPIRONOLACTONE 25 MG PO TAB [7437]: 25 mg | ORAL | NDC 00904692761

## 2022-06-23 MED ADMIN — ROSUVASTATIN 20 MG PO TAB [88504]: 40 mg | ORAL | @ 15:00:00 | Stop: 2022-06-23 | NDC 68462026390

## 2022-06-23 MED ADMIN — DAPAGLIFLOZIN PROPANEDIOL 10 MG PO TAB [320172]: 10 mg | ORAL | @ 15:00:00 | Stop: 2022-06-23 | NDC 00310621030

## 2022-06-23 MED ADMIN — LEVOTHYROXINE 175 MCG PO TAB [10406]: 175 ug | ORAL | @ 15:00:00 | Stop: 2022-06-23 | NDC 00904695761

## 2022-06-23 MED ADMIN — EZETIMIBE 10 MG PO TAB [86887]: 10 mg | ORAL | @ 15:00:00 | Stop: 2022-06-23 | NDC 67877049090

## 2022-06-23 MED ADMIN — ASPIRIN 81 MG PO CHEW [680]: 81 mg | ORAL | @ 15:00:00 | Stop: 2022-06-23 | NDC 00904679480

## 2022-06-23 MED ADMIN — HYDRALAZINE 10 MG PO TAB [3698]: 50 mg | ORAL | @ 15:00:00 | Stop: 2022-06-23 | NDC 68084044711

## 2022-06-23 MED ADMIN — SPIRONOLACTONE 25 MG PO TAB [7437]: 25 mg | ORAL | @ 15:00:00 | Stop: 2022-06-23 | NDC 00904692761

## 2022-06-23 MED ADMIN — AMLODIPINE 5 MG PO TAB [79041]: 5 mg | ORAL | @ 15:00:00 | Stop: 2022-06-23 | NDC 00904637061

## 2022-06-23 MED ADMIN — HYDRALAZINE 10 MG PO TAB [3698]: 50 mg | ORAL | @ 03:00:00 | NDC 51079007401

## 2022-06-29 ENCOUNTER — Encounter: Admit: 2022-06-29 | Discharge: 2022-06-29 | Payer: MEDICARE

## 2022-06-29 DIAGNOSIS — J449 Chronic obstructive pulmonary disease, unspecified: Secondary | ICD-10-CM

## 2022-06-29 DIAGNOSIS — I10 Essential (primary) hypertension: Secondary | ICD-10-CM

## 2022-06-29 DIAGNOSIS — E1151 Type 2 diabetes mellitus with diabetic peripheral angiopathy without gangrene: Secondary | ICD-10-CM

## 2022-06-29 DIAGNOSIS — E7800 Pure hypercholesterolemia, unspecified: Secondary | ICD-10-CM

## 2022-06-29 DIAGNOSIS — R9439 Abnormal result of other cardiovascular function study: Secondary | ICD-10-CM

## 2022-06-29 DIAGNOSIS — Z95818 Presence of other cardiac implants and grafts: Secondary | ICD-10-CM

## 2022-06-29 DIAGNOSIS — Z950 Presence of cardiac pacemaker: Secondary | ICD-10-CM

## 2022-06-29 DIAGNOSIS — R079 Chest pain, unspecified: Secondary | ICD-10-CM

## 2022-06-29 DIAGNOSIS — I2510 Atherosclerotic heart disease of native coronary artery without angina pectoris: Secondary | ICD-10-CM

## 2022-06-29 DIAGNOSIS — Z0189 Encounter for other specified special examinations: Secondary | ICD-10-CM

## 2022-06-29 DIAGNOSIS — I480 Paroxysmal atrial fibrillation: Secondary | ICD-10-CM

## 2022-06-29 DIAGNOSIS — I483 Typical atrial flutter: Secondary | ICD-10-CM

## 2022-06-29 DIAGNOSIS — E039 Hypothyroidism, unspecified: Secondary | ICD-10-CM

## 2022-06-29 DIAGNOSIS — I4891 Unspecified atrial fibrillation: Secondary | ICD-10-CM

## 2022-06-29 MED ORDER — REPATHA SURECLICK 140 MG/ML SC PNIJ
140 mg | SUBCUTANEOUS | 11 refills | 28.00000 days | Status: AC
Start: 2022-06-29 — End: ?
  Filled 2022-07-03: qty 2, 28d supply, fill #1

## 2022-06-29 NOTE — Progress Notes
Date of Service: 06/29/2022    JARNELL BOTHA is a 76 y.o. male.       HPI   Mr. Zeisloft is followed for coronary artery diseases, sinus bradycardia, peripheral arterial disease, hyperlipidemia, aortic stenosis, hypertension and heart failure with preserved ejection fraction and paroxysmal atrial fibrillation.  He underwent placement of a Watchman left atrial appendage occlusion device on 02/20/2022.  Follow-up transesophageal echocardiography performed on 04/05/2022 showed a well-seated Watchman device with no evidence of peridevice leak.  His permanent pacemaker then revealed episodes of nonsustained ventricular tachycardia and a stress test was obtained to assess for an ischemic trigger for these episodes.  It was abnormal and coronary angiography with coronary intervention to the right posterior descending artery was performed on 06/22/2022.  Mr. Seneca's congestive symptoms have been very well controlled on his current medical regimen of furosemide 40 mg every other day and Jardiance 25 mg daily.  He has not had to take supplemental furosemide in recent months.   The patient indicates that he notices tiredness in his legs when he walks uphill, but this is a chronic symptom that has not progressed in recent years.  He swims for 30 minutes 6 days a week.  He can walk long distances on a flat surface.  He does report rare brief exertional chest discomfort when he is walking out in cold weather.  The chest discomfort is located in his left pectoral area and is mild and resolves quickly with rest.  Otherwise, Mr. Cole indicates that he has been stable and he reports no congestive symptoms, palpitations, sensation of sustained forceful heart pounding, lightheadedness or syncope. The patient reports no myalgias, bleeding abnormalities, claudication neurologic motor abnormalities or difficulty with speech.  He has been tolerating the 40 mg dose of rosuvastatin without adverse effects.  Wearing support stockings usually helps him control his edema well.   Historically, Mr. Pippins underwent coronary artery bypass surgery in December 2005. At that time his revascularization consisted of an IMA graft to the LAD with a right internal mammary going to the obtuse marginal and a radial to the posterior descending artery. I saw Mr. Karwowski on 09/23/10 after an abnormal stress test was recently obtained. On 09/23/10 he told me that he had infrequent mild angina which he had noted over the past year. It occurred less than once a month and only when he was walking uphill and only when he was walking shortly after a meal. On 09/17/10 he walked 6 blocks in 10 minutes during a parade and had no angina because he had not eaten recently and was not walking uphill. I reviewed his stress test with him and he wanted to proceed with coronary angiography which was performed on 09/26/10. Intervention was not required. On 09/30/10 carvedilol was added to his medical regimen for BP control and he has tolerated this medication without difficulty. On 10/21/10 HCTZ 25 mg with triamterene 37.5 mg daily was also added to his medical regimen for improved BP control.  The patient says that he had cellulitis in his left lower extremity in September 2015 that resolved with antibiotic therapy. Mr. Griffis developed dyspnea with exertion and underwent right and left heart catheterization/coronary angiography on 06/18/2018. Intervention was not required.He reports that he developed Covid illness on July 07 2019 with mild to moderate symptoms.  Mr. Hauptmann reports that he had a chest x-ray and there was no evidence for pneumonia.  He was placed on supplemental oxygen which he has used occasionally since developing  Covid illness. He was hospitalized in [place] in April 2021 for hypertension with exacerbation of congestive heart failure.  . He had typical atrial flutter with bradycardic response and was hospitalized in July 2022 and seen by Dr. Bernette Mayers on consult service.  On January 21, 2021 Dr. Wallene Huh did cavotricuspid isthmus ablation.  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 Derrell Lolling implanted a dual-chamber pacemaker.            Vitals:    06/29/22 1356   BP: 102/64   BP Source: Arm, Left Upper   Pulse: 84   SpO2: 96%   O2 Device: None (Room air)   PainSc: Zero   Weight: 97 kg (213 lb 12.8 oz)   Height: 180.3 cm (5' 11)     Body mass index is 29.82 kg/m?[name]     Past Medical History  Patient Active Problem List    Diagnosis Date Noted    Coronary artery disease due to lipid rich plaque 06/22/2022    Presence of Watchman left atrial appendage closure device 02/21/2022     02/20/2022 - Dr. Milas Kocher; Left atrial appendage occlusion with 31 mm Watchman FLX device      PAF (paroxysmal atrial fibrillation) (HCC) 02/20/2022    Cardiac pacemaker 05/16/2021     01/21/2021 - Successful Dual-chamber Pacemaker Implantation with Dr. Derrell Lolling      Atrial fibrillation [facility]) 05/13/2021    Bradycardia 02/01/2021    DM (diabetes mellitus) type II, controlled, with peripheral vascular disorder (HCC) 02/01/2021    Paroxysmal nocturnal dyspnea 02/01/2021    Atrial flutter (HCC) 01/19/2021     01/21/2021 - AFL RFA with Dr. Wallene Huh:  Successful cavo-tricuspid isthmus ablation.  Comprehensive EP study with coronary sinus catheter placement.  Underlying severe sinus node dysfunction. No escape rhythm.      Chronic heart failure with preserved ejection fraction (HCC) 12/04/2018     RIGHT HEART CATHETERIZATION:  06/2018 in the setting of severe hypertension  HEMODYNAMICS:   Blood pressure 198/81 with a mean of 130 mmHg. Heart rate 50 beats per minute.   PRESSURES:  The right atrial pressure was 4 mmHg. Right ventricular pressure was 64/8 mmHg.  The pulmonary artery pressure was 60/70 mmHg with a mean of 32 mmHg. The pulmonary capillary wedge pressure was 17 mmHg. The transpulmonary gradient was 15 mmHg.  The diastolic pulmonary gradient was 0 mmHg. The pulmonary vascular resistance was 2 Wood units.   RIGHT SATURATIONS: The right atrial saturation 77%. The pulmonary artery saturation was 75%. The aortic saturation was 96%.   OUTPUTS: The cardiac output by Fick method was 5.8 L/minute with a cardiac index of 2.7 L/minute per sq m. The cardiac index by thermodilution was 7.3 with a cardiac index of 3.4 L/minute per sq m.      Nonrheumatic aortic valve stenosis 06/06/2018     07/25/2021 - ECHO:  A septal wall motion abnormality is noted consistent with prior cardiac surgery.  No other regional wall motion abnormalities are noted. Overall left ventricular systolic function appears normal and dynamic. The estimated left ventricular ejection fraction is 65%.  There is mild concentric left ventricular hypertrophy.  Mild right ventricular enlargement with normal right ventricular systolic function. Mild left atrial enlargement.  Moderate aortic valve stenosis.  No pericardial effusion is seen.      Dyslipidemia 10/23/2012    Chest pain 09/26/2010    Abnormal stress test 09/26/2010    Murmur 09/23/2010    Coronary artery  disease      A. Onset mid 2004, progression fall 2005, patient reduces activity at work to avoid symptoms  B. 12/05 Progressive angina, Dr. Duanne Limerick, then 06/29/04 CBP Atch OV, referred for cath          >12/29 Cath 100%RCA, 75%LAD&Cx,50%LM EF 55%          >07/01/05 CABGx3 LIMA-LAD w/ Y-RIMA-OM, Rad-PDA per Dr. Ky Barban  C. 06/29/08 exercise echocardiographic images demonstrated basal septal ischemia, significant hypertensive response to exercise, good and adequate exercise tolerance with no angina.  D. LHC 06/2018 for new heart failure symptoms: LEFT HEART CATHETERIZATION:  HEMODYNAMICS:  The aortic pressure was- 195/63 with a mean of 105 mmHg .  The left ventricular systolic pressure was 210 mmHg.  There was a peak to peak gradient of 15 mmHg.  Left main coronary artery:  Left main coronary artery arises normally from the left coronary sinus. The left main coronary artery bifurcates into left anterior descending and left circumflex artery and is free of angiographically significant disease.  Left anterior descending:  Left anterior descending is a large vessel which gives rise to- 3 diagonal vessels. 1 diagonal has a 80% ostial stenosis, the rest of diagonal vessels do not appear to be angiographically significant. Distal LAD has competitive flow from the LIMA.   Left circumflex artery:  The left circumflex artery appears to be medium caliber and has a 100% stenosis proximally. The mid-to-distal portion of the vessel fills from bridging collaterals from proximal portion and is diffusely disease. Three obtuse marginal branches arise, which are also diffusely diseased.   Right coronary artery:  The right coronary artery is a medium caliber vessel, but has 100% chronic total occlusion after the takeoff of conus branch. The PDA fills retrogradely from the radial graft to supply the PLV and has mild plaquing in the native PDA.    BYPASS GRAFT ANATOMY:  1.The LIMA to LAD was visualized using IM catheter.  The ostium, body was noted to be free of any angiographic disease, but has a 80% stenosis at the anastamosis with diffusely diseased distal LAD. There is a Y jump graft from the LIMA to the OM branch which fills retrogradely.            2. The radial artery graft to the PDA appears to be without any angiographically significant disease at ostium, body or anastomotic site.   E. Regadenosom MPI 11/2018 TID Ratio:  1.05  (normal <1.36). Summed Stress Score:  3   , Summed Rest Score:  0 Quantitative polar maps show a statistically insignificant amount of inferoapical ischemia. Mild inferobasilar septal hypokinesis.       Left Ventricular Ejection Fraction (post stress, in the resting state) =  52 %.  Left Ventricular End Diastolic Volume: 161 mL       SUMMARY/OPINION:   This study is borderline abnormal with limited reversible uptake in the inferior wall.  All segments are viable.  Global left ventricular function is within normal limits. In aggregate the current study is low risk in regards to predicted annual cardiovascular mortality rate.         Hypercholesterolemia      A.  02/28/01 total 324 total 249 HDL 43 LDL 231 Pt deferred meds  B. 06/29/04 total 273 trig 193 HDL 36 LDLd206 Zocor begun dc'd by pt d/t statinphobia and cost  C.  2/24 Rx Vytorin 10/80 1/2 tab.      Hypothyroidism      A.  2004 presentation with TSH over 200, Rx w l-thyroxine      Hypertension      A.  2/06 Altace 5 dc'd w/ dysgeusia, Metoprolol 25 BID begun         Tests ordered      A. 07/17/08 carotid artery duplex scan: no significant stenosis           Review of Systems   Constitutional: Negative.   HENT: Negative.     Eyes: Negative.    Cardiovascular: Negative.    Respiratory: Negative.     Endocrine: Negative.    Hematologic/Lymphatic: Negative.    Skin: Negative.    Musculoskeletal: Negative.    Gastrointestinal: Negative.    Genitourinary: Negative.    Neurological: Negative.    Psychiatric/Behavioral: Negative.     Allergic/Immunologic: Negative.        Physical Exam  GENERAL: The patient is well developed, well nourished, resting comfortably and in no distress.    HEENT: No abnormalities of the visible oro-nasopharynx, conjunctiva or sclera are noted.    NECK: There is no jugular venous distension. Carotids are palpable and without bruits. There is no thyroid enlargement.    Chest: Lung fields are clear to auscultation. There are no wheezes or crackles.    CV: There is a regular rhythm. The first and second heart sounds are normal. A grade 3/6 systolic murmur is heard, loudest in the right upper sternal area. There are no murmurs, gallops or rubs.  I checked his apical heart rate and it was 64 bpm.  ABD: The abdomen is soft and supple with normal bowel sounds. There is no hepatosplenomegaly, ascites, tenderness, masses or bruits.    Neuro: There are no focal motor defects. Ambulation is normal. Cognitive function appears normal.    Ext: There is trace bipedal edema without evidence of deep vein thrombosis.  Peripheral pulses are decreased but he has normal capillary refill and no ischemic ulcers.  His recent left femoral artery catheterization site looks excellent without bleeding or hematoma.  SKIN: No rashes or cellulitis.  PSYCH: The patient is calm, rationale and oriented.    Cardiovascular Studies  A twelve-lead ECG obtained on June 23, 2022 shows an AV dual paced rhythm with a heart rate of 62 bpm.  Transesophageal echo 06/05/2022:  Interpretation Summary    Normal left ventricular cavity size and wall thickness  Normal ejection fraction estimated at 55%  Moderate to severe left atrial dilatation  Well-seated Watchman device with no evidence of peridevice leak  Mild mitral regurgitation  Calcified aortic valve with restricted leaflet mobility.  Moderate aortic stenosis.  Significant atheromatous plaque in descending aorta  No pericardial effusion    Coronary angiography/intervention 06/22/2022:  ORONARY ANATOMY AND FINDINGS:    Left main coronary artery:  The left main coronary artery arises from the left coronary sinus.  The left main distally trifurcates into a left anterior descending artery, a ramus intermedius, and left circumflex coronary artery.  The left main was calcified and in the mid segment has 50% stenosis that advanced to 70% distally.  Left anterior descending artery:  The left anterior descending artery is a medium to large caliber vessel that gives rise to 3 diagonal branches.  The proximal segment of the left anterior descending artery is calcified with 80% segmental stenosis.  Distal flow beyond this area came from left internal mammary artery graft and also native flow.  There are three medium-sized diagonals that arises from the left anterior descending artery  and the three of them had mild diffuse disease.  Left circumflex coronary artery:  The left circumflex coronary artery appears to be a medium caliber vessel ostially occluded with distal flow coming from both left left-to-left and right-to-left collaterals.  Ramus intermedius:  There is what appears to be a medium caliber ramus intermedius that in the ostium has 80% stenosis.  Right coronary artery:  The right coronary artery arises from the right coronary sinus and proximally 100% occluded.  Distal flow came from the free radial graft to the RPDA branch.  The right coronary artery appears to be dominant.     BYPASS GRAFT ANATOMY:    Free radial graft to RPDA:  This graft between the origin in the aorta and the distal insertion to the posterior descending artery is noted proximally to have a 70% to 80% stenosis.  Left internal mammary artery to left anterior descending artery and right internal mammary artery Y-graft to obtuse marginal branch:  The entire segment of this graft from the origin to the body and the distal insertion was patent without any angiographically significant disease.  The right internal mammary artery Y-graft that takes off from the distal one fourth of the vessel is also patent.  Both vessels supply medium to large caliber vessel that were distally patent without any angiographically significant disease.      LESION SPECIFIC INFORMATION:    Lesion location:  Proximal free radial to RPDA.  Preintervention stenosis severity: 70% to 80%.  Postintervention stenosis severity:  0%.  Preintervention distal TIMI flow:  3.  Post intervention distal TIMI flow:  3.  ACC/AHA type of lesion:  B2.   My attending, Dr. Micheline Rough, was present throughout the entire duration of procedure performing key aspects of the procedure when necessary.     FINAL IMPRESSION:    Severe multivessel coronary artery disease.  Patent left internal mammary artery to left anterior descending artery with RIMA free Y-graft to obtuse marginal branch.  Severe stenosis of proximal free radial graft to RPDA that was hemodynamically significant by RFR of 0.87.  Successful percutaneous coronary intervention of proximal free radial graft to RPDA with placement of 1 drug-eluting stent.  IVUS was used for stent optimization.     RECOMMENDATIONS:    Routine post cardiac catheterization care.  Aspirin 81 mg daily for chronically as long as tolerated.  Effient 10 mg daily for 6 months and reassess.    Cardiovascular Health Factors  Vitals BP Readings from Last 3 Encounters:   06/29/22 102/64   06/23/22 (!) 151/65   06/06/22 (!) 149/81     Wt Readings from Last 3 Encounters:   06/29/22 97 kg (213 lb 12.8 oz)   06/22/22 98.2 kg (216 lb 6.4 oz)   06/06/22 96.6 kg (213 lb)     BMI Readings from Last 3 Encounters:   06/29/22 29.82 kg/m?   06/22/22 30.18 kg/m?   06/06/22 29.71 kg/m?      Smoking Social History     Tobacco Use   Smoking Status Former    Packs/day: 1.00    Years: 40.00    Additional pack years: 0.00    Total pack years: 40.00    Types: Cigarettes    Quit date: 06/02/2004    Years since quitting: 18.0   Smokeless Tobacco Never      Lipid Profile Cholesterol   Date Value Ref Range Status   03/16/2022 145  Final     HDL   Date Value Ref  Range Status   03/16/2022 39 (L) >=40 Final     LDL   Date Value Ref Range Status   03/16/2022 77  Final     Triglycerides   Date Value Ref Range Status   03/16/2022 148  Final      Blood Sugar Hemoglobin A1C   Date Value Ref Range Status   04/12/2021 6.8 (H) <5.6 Final     Glucose   Date Value Ref Range Status   06/23/2022 109 (H) 70 - 100 MG/DL Final   16/04/9603 540 (H) 70 - 105 Final   02/21/2022 159 (H) 70 - 100 MG/DL Final   98/05/9146 829 (H) 70 - 110 MG/DL Final   56/21/3086 578 (H) 70 - 110 MG/DL Final   46/96/2952 841 (H) 70 - 110 MG/DL Final     Glucose, POC   Date Value Ref Range Status   06/22/2022 111 (H) 70 - 100 MG/DL Final   32/44/0102 725 (H) 70 - 100 MG/DL Final   36/64/4034 742 (H) 70 - 100 MG/DL Final          Problems Addressed Today  Encounter Diagnoses   Name Primary?    Primary hypertension     Hypercholesterolemia     Coronary artery disease due to lipid rich plaque     PAF (paroxysmal atrial fibrillation) (HCC)     Typical atrial flutter (HCC)     Paroxysmal atrial fibrillation (HCC)     DM (diabetes mellitus) type II, controlled, with peripheral vascular disorder (HCC)        Assessment and Plan     Mr. Heyward is doing well following percutaneous coronary intervention to the right posterior descending coronary artery on 06/22/2022.  I strongly encouraged him to start taking his Repatha to further lower his LDL cholesterol.  He has a prescription for this medication but has not obtained it yet.  I also asked him to carry sublingual nitroglycerin since he is having rare mild brief episodes of of exertional chest discomfort usually only when he walks in cold weather.  He did not want to start on isosorbide.  I have asked him to continue with remote surveillance of his permanent pacemaker along with a yearly comprehensive interrogation of his device.  I have asked him to return for follow-up in 6 months time to follow his progress. The total time spent during this interview and exam with preparation and chart review was 30 minutes.         Current Medications (including today's revisions)   amLODIPine (NORVASC) 5 mg tablet TAKE 1 TABLET TWICE DAILY    aspirin EC (ASPIR-LOW) 81 mg tablet Take one tablet by mouth daily. Indications: s/p Watchman    calcium carbonate (TUMS) 500 mg (200 mg elemental calcium) chewable tablet Chew one tablet by mouth daily as needed.    CHOLEcalciferoL (vitamin D3) (VITAMIN D3) 50 mcg (2,000 unit) tablet Take one tablet by mouth daily.    coenzyme Q10 100 mg cap Take one capsule by mouth three times weekly.    CRANBERRY FRUIT EXTRACT (CRANBERRY PO) Take 4,200 mg by mouth daily.    cyanocobalamin (vitamin B-12) 500 mcg tablet Take one tablet by mouth daily.    empagliflozin (JARDIANCE) 25 mg tablet Take one tablet by mouth daily.    evolocumab (REPATHA SURECLICK) 140 mg/mL injectable PEN Inject 1 mL under the skin every 14 days.    ezetimibe (ZETIA) 10 mg tablet TAKE 1 TABLET EVERY DAY    ferrous  sulfate (FEOSOL) 325 mg (65 mg iron) tablet Take one tablet by mouth twice weekly. Take on an empty stomach at least 1 hour before or 2 hours after food.    furosemide (LASIX) 40 mg tablet TAKE ONE TABLET DAILY. MAY TAKE AN ADDITIONAL LASIX AS NEEDED (DOSE ADJUSTMENT)    glucosamine/chondro su A/C/Mn (GLUCOSAMINE-CHONDROITIN COMPLX PO) Take 1 tablet by mouth daily.    hydrALAZINE (APRESOLINE) 50 mg tablet TAKE 1 TABLET TWICE DAILY    levothyroxine (SYNTHROID) 175 mcg tablet Take one tablet by mouth daily 30 minutes before breakfast.    losartan (COZAAR) 100 mg tablet TAKE 1 TABLET EVERY DAY    metFORMIN (GLUCOPHAGE) 500 mg tablet Take one tablet by mouth twice daily with meals. Ok to resume 06/21/18    omeprazole DR(+) (PRILOSEC) 20 mg PO capsule Take one capsule by mouth daily.    prasugreL (EFFIENT) 10 mg tablet Take one tablet by mouth daily.    rosuvastatin (CRESTOR) 40 mg tablet TAKE 1 TABLET EVERY DAY (Patient taking differently: Take one tablet by mouth daily.)    spironolactone (ALDACTONE) 25 mg tablet TAKE 1 TABLET EVERY DAY WITH FOOD    vitamins, multiple tablet Take one tablet by mouth every 48 hours.

## 2022-06-29 NOTE — Patient Instructions
Take Repatha    Recheck FLP, Alt in 3 months    Follow up in 6 months    Follow up as directed.  Call sooner if issues.  Call the [facility] nursing line at [phone].  Leave a detailed message for the nurse in [place] with how we can assist you and we will call you back.

## 2022-06-30 ENCOUNTER — Encounter: Admit: 2022-06-30 | Discharge: 2022-06-30 | Payer: MEDICARE

## 2022-06-30 NOTE — Progress Notes
Pharmacy Benefits Investigation    Medication name: REPATHA SURECLICK 140 MG/ML SC PNIJ    The insurance does not require a prior authorization for the medication.    The out of pocket cost is $147.33.    A grant is available through [employer]. A grant was obtained and will provide the patient with $2500 through 05/31/2023.    Lucie Leather  Specialty Pharmacy Patient Advocate

## 2022-07-06 ENCOUNTER — Encounter: Admit: 2022-07-06 | Discharge: 2022-07-06 | Payer: MEDICARE

## 2022-07-15 ENCOUNTER — Encounter: Admit: 2022-07-15 | Discharge: 2022-07-15 | Payer: MEDICARE

## 2022-07-15 MED ORDER — HYDRALAZINE 50 MG PO TAB
ORAL_TABLET | 3 refills
Start: 2022-07-15 — End: ?

## 2022-07-20 ENCOUNTER — Encounter: Admit: 2022-07-20 | Discharge: 2022-07-20 | Payer: MEDICARE

## 2022-07-27 ENCOUNTER — Encounter: Admit: 2022-07-27 | Discharge: 2022-07-27 | Payer: MEDICARE

## 2022-07-27 MED FILL — REPATHA SURECLICK 140 MG/ML SC PNIJ: 140 mg/mL | SUBCUTANEOUS | 28 days supply | Qty: 2 | Fill #2 | Status: AC

## 2022-08-09 ENCOUNTER — Encounter: Admit: 2022-08-09 | Discharge: 2022-08-09 | Payer: MEDICARE

## 2022-08-09 MED ORDER — FUROSEMIDE 40 MG PO TAB
ORAL_TABLET | ORAL | 3 refills | 90.00000 days | Status: AC
Start: 2022-08-09 — End: ?

## 2022-08-17 ENCOUNTER — Encounter: Admit: 2022-08-17 | Discharge: 2022-08-17 | Payer: MEDICARE

## 2022-08-18 ENCOUNTER — Encounter: Admit: 2022-08-18 | Discharge: 2022-08-18 | Payer: MEDICARE

## 2022-08-18 MED FILL — REPATHA SURECLICK 140 MG/ML SC PNIJ: 140 mg/mL | SUBCUTANEOUS | 28 days supply | Qty: 2 | Fill #3 | Status: AC

## 2022-09-14 ENCOUNTER — Encounter: Admit: 2022-09-14 | Discharge: 2022-09-14 | Payer: MEDICARE

## 2022-09-14 MED FILL — REPATHA SURECLICK 140 MG/ML SC PNIJ: 140 mg/mL | SUBCUTANEOUS | 28 days supply | Qty: 2 | Fill #4 | Status: AC

## 2022-09-25 ENCOUNTER — Encounter: Admit: 2022-09-25 | Discharge: 2022-09-25 | Payer: MEDICARE

## 2022-09-28 ENCOUNTER — Encounter: Admit: 2022-09-28 | Discharge: 2022-09-28 | Payer: MEDICARE

## 2022-09-28 DIAGNOSIS — I483 Typical atrial flutter: Secondary | ICD-10-CM

## 2022-09-28 DIAGNOSIS — I2510 Atherosclerotic heart disease of native coronary artery without angina pectoris: Secondary | ICD-10-CM

## 2022-09-28 DIAGNOSIS — E1151 Type 2 diabetes mellitus with diabetic peripheral angiopathy without gangrene: Secondary | ICD-10-CM

## 2022-09-28 DIAGNOSIS — E7800 Pure hypercholesterolemia, unspecified: Secondary | ICD-10-CM

## 2022-09-28 DIAGNOSIS — I10 Essential (primary) hypertension: Secondary | ICD-10-CM

## 2022-09-28 DIAGNOSIS — I480 Paroxysmal atrial fibrillation: Secondary | ICD-10-CM

## 2022-09-28 LAB — ALT (SGPT): ALT: 53

## 2022-09-28 LAB — LIPID PROFILE
CHOLESTEROL/HDL %: 2
CHOLESTEROL: 92
HDL: 42
LDL: 29
TRIGLYCERIDES: 106
VLDL: 21

## 2022-09-29 ENCOUNTER — Encounter: Admit: 2022-09-29 | Discharge: 2022-09-29 | Payer: MEDICARE

## 2022-09-29 NOTE — Telephone Encounter
-----   Message from Nehemiah Massed, MD sent at 09/29/2022  4:02 PM CDT -----  Very favorable LDL cholesterol.  Please let him know.  Thanks.  SBG

## 2022-09-29 NOTE — Telephone Encounter
Results and recommendations called to patient lmom requested call back if questions

## 2022-10-14 ENCOUNTER — Encounter: Admit: 2022-10-14 | Discharge: 2022-10-14 | Payer: MEDICARE

## 2022-10-16 ENCOUNTER — Encounter: Admit: 2022-10-16 | Discharge: 2022-10-16 | Payer: MEDICARE

## 2022-10-16 MED FILL — REPATHA SURECLICK 140 MG/ML SC PNIJ: 140 mg/mL | SUBCUTANEOUS | 28 days supply | Qty: 2 | Fill #5 | Status: AC

## 2022-10-17 ENCOUNTER — Encounter: Admit: 2022-10-17 | Discharge: 2022-10-17 | Payer: MEDICARE

## 2022-10-17 NOTE — Telephone Encounter
-----   Message from Tempie Hoist, BSN sent at 09/05/2022  8:20 AM CST -----  Regarding: ANO 1 yr post Watchman  Please call the patient to schedule their 1 yr post Watchman office visit and TEE.      The office visit should be scheduled with an EP APP or the implanting physician (Dr. Milas Kocher).      The Watchman was completed on 02/20/2022.      The 1 year post procedure visits should be scheduled as close to 1 year post procedure as possible.  However, they can be scheduled +/- 60 days from the 1 year post procedure date per registry requirements.        FYI:  he missed his 6 mo post Watchman appt but it looks like Plavix was stopped on 06/06/2022.

## 2022-10-17 NOTE — Telephone Encounter
Called pt. No answer. LVM requesting CB to schedule TEE.

## 2022-10-18 ENCOUNTER — Encounter: Admit: 2022-10-18 | Discharge: 2022-10-18 | Payer: MEDICARE

## 2022-11-07 ENCOUNTER — Encounter: Admit: 2022-11-07 | Discharge: 2022-11-07 | Payer: MEDICARE

## 2022-11-13 ENCOUNTER — Encounter: Admit: 2022-11-13 | Discharge: 2022-11-13 | Payer: MEDICARE

## 2022-11-14 ENCOUNTER — Encounter: Admit: 2022-11-14 | Discharge: 2022-11-14 | Payer: MEDICARE

## 2022-11-14 MED FILL — REPATHA SURECLICK 140 MG/ML SC PNIJ: 140 mg/mL | SUBCUTANEOUS | 28 days supply | Qty: 2 | Fill #6 | Status: AC

## 2022-11-22 ENCOUNTER — Encounter: Admit: 2022-11-22 | Discharge: 2022-11-22 | Payer: MEDICARE

## 2022-12-01 ENCOUNTER — Encounter: Admit: 2022-12-01 | Discharge: 2022-12-01 | Payer: MEDICARE

## 2022-12-04 ENCOUNTER — Encounter: Admit: 2022-12-04 | Discharge: 2022-12-04 | Payer: MEDICARE

## 2022-12-05 ENCOUNTER — Encounter: Admit: 2022-12-05 | Discharge: 2022-12-05 | Payer: MEDICARE

## 2022-12-05 DIAGNOSIS — I2510 Atherosclerotic heart disease of native coronary artery without angina pectoris: Secondary | ICD-10-CM

## 2022-12-05 DIAGNOSIS — J449 Chronic obstructive pulmonary disease, unspecified: Secondary | ICD-10-CM

## 2022-12-05 DIAGNOSIS — E1151 Type 2 diabetes mellitus with diabetic peripheral angiopathy without gangrene: Secondary | ICD-10-CM

## 2022-12-05 DIAGNOSIS — I5032 Chronic diastolic (congestive) heart failure: Secondary | ICD-10-CM

## 2022-12-05 DIAGNOSIS — Z0189 Encounter for other specified special examinations: Secondary | ICD-10-CM

## 2022-12-05 DIAGNOSIS — I483 Typical atrial flutter: Secondary | ICD-10-CM

## 2022-12-05 DIAGNOSIS — R079 Chest pain, unspecified: Secondary | ICD-10-CM

## 2022-12-05 DIAGNOSIS — I10 Essential (primary) hypertension: Secondary | ICD-10-CM

## 2022-12-05 DIAGNOSIS — E7800 Pure hypercholesterolemia, unspecified: Secondary | ICD-10-CM

## 2022-12-05 DIAGNOSIS — I480 Paroxysmal atrial fibrillation: Secondary | ICD-10-CM

## 2022-12-05 DIAGNOSIS — J9621 Acute and chronic respiratory failure with hypoxia: Secondary | ICD-10-CM

## 2022-12-05 DIAGNOSIS — R9439 Abnormal result of other cardiovascular function study: Secondary | ICD-10-CM

## 2022-12-05 DIAGNOSIS — Z95818 Presence of other cardiac implants and grafts: Secondary | ICD-10-CM

## 2022-12-05 DIAGNOSIS — Z950 Presence of cardiac pacemaker: Secondary | ICD-10-CM

## 2022-12-05 DIAGNOSIS — E039 Hypothyroidism, unspecified: Secondary | ICD-10-CM

## 2022-12-05 DIAGNOSIS — I159 Secondary hypertension, unspecified: Secondary | ICD-10-CM

## 2022-12-05 DIAGNOSIS — I4891 Unspecified atrial fibrillation: Secondary | ICD-10-CM

## 2022-12-05 MED ORDER — ROSUVASTATIN 20 MG PO TAB
20 mg | ORAL_TABLET | Freq: Every day | ORAL | 3 refills | 90.00000 days | Status: AC
Start: 2022-12-05 — End: ?

## 2022-12-05 NOTE — Patient Instructions
Thank you for visiting our office today.    We would like to make the following medication adjustments:      You can Stop Prasugrel on 12/22/22       Otherwise continue the same medications as you have been doing.          We will be pursuing the following tests after your appointment today:       Orders Placed This Encounter    2D + DOPPLER ECHO         We will plan to see you back in 5-6 months with echo prior.  Please call us in the meantime with any questions or concerns.        Please allow 5-7 business days for our providers to review your results. All normal results will go to MyChart. If you do not have Mychart, it is strongly recommended to get this so you can easily view all your results. If you do not have mychart, we will attempt to call you once with normal lab and testing results. If we cannot reach you by phone with normal results, we will send you a letter.  If you have not heard the results of your testing after one week please give [place] a call.       Your Cardiovascular Medicine [place] [place] [facility] Team Brett Canales, Pilar Jarvis, Shawna Orleans, and [place])  phone number is [phone].

## 2022-12-05 NOTE — Progress Notes
Date of Service: 12/05/2022    Mason Lawson is a 77 y.o. male.       HPI   Mason Lawson is followed for coronary artery diseases, sinus bradycardia, peripheral arterial disease, hyperlipidemia, aortic stenosis, hypertension and heart failure with preserved ejection fraction and paroxysmal atrial fibrillation.  He underwent placement of a Watchman left atrial appendage occlusion device on 02/20/2022.  Follow-up transesophageal echocardiography performed on 04/05/2022 showed a well-seated Watchman device with no evidence of peridevice leak.  His permanent pacemaker then revealed episodes of nonsustained ventricular tachycardia and a stress test was obtained to assess for an ischemic trigger for these episodes.  It was abnormal and coronary angiography with coronary intervention to the right posterior descending artery was performed on 06/22/2022.  Mason Lawson's congestive symptoms have been very well controlled on his current medical regimen of furosemide 40 mg every other day. He has not had to take supplemental furosemide in recent months.  He stopped taking Jardiance because of the expense.  The patient indicates that he notices tiredness in his legs when he walks uphill, but this is a chronic symptom that has not progressed in recent years.  He swims for 20-30 minutes 6 days a week.  He can walk long distances on a flat surface.  He continues to report rare brief exertional chest discomfort when he is walking longer distances especially in cold weather.  The chest discomfort is located in his left pectoral area and is mild and resolves quickly with rest.  Otherwise, Mason Lawson indicates that he has been stable and he reports no congestive symptoms, palpitations, sensation of sustained forceful heart pounding, lightheadedness or syncope. The patient reports no myalgias, bleeding abnormalities, claudication or strokelike symptoms.    I see h has been tolerating the 40 mg dose of rosuvastatin without adverse effects.  Wearing support stockings usually helps him control his edema well.   Historically, Mason Lawson underwent coronary artery bypass surgery in December 2005. At that time his revascularization consisted of an IMA graft to the LAD with a right internal mammary going to the obtuse marginal and a radial to the posterior descending artery. I saw Mason Lawson on 09/23/10 after an abnormal stress test was recently obtained. On 09/23/10 he told me that he had infrequent mild angina which he had noted over the past year. It occurred less than once a month and only when he was walking uphill and only when he was walking shortly after a meal. On 09/17/10 he walked 6 blocks in 10 minutes during a parade and had no angina because he had not eaten recently and was not walking uphill. I reviewed his stress test with him and he wanted to proceed with coronary angiography which was performed on 09/26/10. Intervention was not required. On 09/30/10 carvedilol was added to his medical regimen for BP control and he has tolerated this medication without difficulty. On 10/21/10 HCTZ 25 mg with triamterene 37.5 mg daily was also added to his medical regimen for improved BP control.  The patient says that he had cellulitis in his left lower extremity in September 2015 that resolved with antibiotic therapy. Mason Lawson developed dyspnea with exertion and underwent right and left heart catheterization/coronary angiography on 06/18/2018. Intervention was not required.He reports that he developed Covid illness on July 07 2019 with mild to moderate symptoms.  Mason Lawson reports that he had a chest x-ray and there was no evidence for pneumonia.  He was placed on supplemental oxygen which he  has used occasionally since developing Covid illness. He was hospitalized in [place] in April 2021 for hypertension with exacerbation of congestive heart failure.  . He had typical atrial flutter with bradycardic response and was hospitalized in July 2022 and seen by Dr. Bernette Mayers on consult service.  On January 21, 2021 Dr. Wallene Huh did cavotricuspid isthmus ablation.  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 Derrell Lolling implanted a dual-chamber pacemaker.            Vitals:    12/05/22 0932   BP: 136/70   BP Source: Arm, Left Upper   Pulse: 80   SpO2: 95%   O2 Device: None (Room air)   PainSc: Zero   Weight: 100.5 kg (221 lb 9.6 oz)   Height: 180.3 cm (5' 11)     Body mass index is 30.91 kg/m?[name]     Past Medical History  Patient Active Problem List    Diagnosis Date Noted    Coronary artery disease due to lipid rich plaque 06/22/2022    Presence of Watchman left atrial appendage closure device 02/21/2022     02/20/2022 - Dr. Milas Kocher; Left atrial appendage occlusion with 31 mm Watchman FLX device      PAF (paroxysmal atrial fibrillation) (HCC) 02/20/2022    Cardiac pacemaker 05/16/2021     01/21/2021 - Successful Dual-chamber Pacemaker Implantation with Dr. Derrell Lolling      Atrial fibrillation [facility]) 05/13/2021    Bradycardia 02/01/2021    DM (diabetes mellitus) type II, controlled, with peripheral vascular disorder (HCC) 02/01/2021    Paroxysmal nocturnal dyspnea 02/01/2021    Atrial flutter (HCC) 01/19/2021     01/21/2021 - AFL RFA with Dr. Wallene Huh:  Successful cavo-tricuspid isthmus ablation.  Comprehensive EP study with coronary sinus catheter placement.  Underlying severe sinus node dysfunction. No escape rhythm.      Chronic heart failure with preserved ejection fraction (HCC) 12/04/2018     RIGHT HEART CATHETERIZATION:  06/2018 in the setting of severe hypertension  HEMODYNAMICS:   Blood pressure 198/81 with a mean of 130 mmHg. Heart rate 50 beats per minute.   PRESSURES:  The right atrial pressure was 4 mmHg. Right ventricular pressure was 64/8 mmHg.  The pulmonary artery pressure was 60/70 mmHg with a mean of 32 mmHg. The pulmonary capillary wedge pressure was 17 mmHg. The transpulmonary gradient was 15 mmHg.  The diastolic pulmonary gradient was 0 mmHg. The pulmonary vascular resistance was 2 Wood units.   RIGHT SATURATIONS: The right atrial saturation 77%. The pulmonary artery saturation was 75%. The aortic saturation was 96%.   OUTPUTS: The cardiac output by Fick method was 5.8 L/minute with a cardiac index of 2.7 L/minute per sq m. The cardiac index by thermodilution was 7.3 with a cardiac index of 3.4 L/minute per sq m.      Nonrheumatic aortic valve stenosis 06/06/2018     07/25/2021 - ECHO:  A septal wall motion abnormality is noted consistent with prior cardiac surgery.  No other regional wall motion abnormalities are noted. Overall left ventricular systolic function appears normal and dynamic. The estimated left ventricular ejection fraction is 65%.  There is mild concentric left ventricular hypertrophy.  Mild right ventricular enlargement with normal right ventricular systolic function. Mild left atrial enlargement.  Moderate aortic valve stenosis.  No pericardial effusion is seen.      Dyslipidemia 10/23/2012    Chest pain 09/26/2010    Abnormal stress test 09/26/2010    Murmur 09/23/2010  Coronary artery disease      A. Onset mid 2004, progression fall 2005, patient reduces activity at work to avoid symptoms  B. 12/05 Progressive angina, Dr. Duanne Limerick, then 06/29/04 CBP Atch OV, referred for cath          >12/29 Cath 100%RCA, 75%LAD&Cx,50%LM EF 55%          >07/01/05 CABGx3 LIMA-LAD w/ Y-RIMA-OM, Rad-PDA per Dr. Ky Barban  C. 06/29/08 exercise echocardiographic images demonstrated basal septal ischemia, significant hypertensive response to exercise, good and adequate exercise tolerance with no angina.  D. LHC 06/2018 for new heart failure symptoms: LEFT HEART CATHETERIZATION:  HEMODYNAMICS:  The aortic pressure was- 195/63 with a mean of 105 mmHg .  The left ventricular systolic pressure was 210 mmHg.  There was a peak to peak gradient of 15 mmHg.  Left main coronary artery:  Left main coronary artery arises normally from the left coronary sinus.  The left main coronary artery bifurcates into left anterior descending and left circumflex artery and is free of angiographically significant disease.  Left anterior descending:  Left anterior descending is a large vessel which gives rise to- 3 diagonal vessels. 1 diagonal has a 80% ostial stenosis, the rest of diagonal vessels do not appear to be angiographically significant. Distal LAD has competitive flow from the LIMA.   Left circumflex artery:  The left circumflex artery appears to be medium caliber and has a 100% stenosis proximally. The mid-to-distal portion of the vessel fills from bridging collaterals from proximal portion and is diffusely disease. Three obtuse marginal branches arise, which are also diffusely diseased.   Right coronary artery:  The right coronary artery is a medium caliber vessel, but has 100% chronic total occlusion after the takeoff of conus branch. The PDA fills retrogradely from the radial graft to supply the PLV and has mild plaquing in the native PDA.    BYPASS GRAFT ANATOMY:  1.The LIMA to LAD was visualized using IM catheter.  The ostium, body was noted to be free of any angiographic disease, but has a 80% stenosis at the anastamosis with diffusely diseased distal LAD. There is a Y jump graft from the LIMA to the OM branch which fills retrogradely.            2. The radial artery graft to the PDA appears to be without any angiographically significant disease at ostium, body or anastomotic site.   E. Regadenosom MPI 11/2018 TID Ratio:  1.05  (normal <1.36). Summed Stress Score:  3   , Summed Rest Score:  0 Quantitative polar maps show a statistically insignificant amount of inferoapical ischemia. Mild inferobasilar septal hypokinesis.       Left Ventricular Ejection Fraction (post stress, in the resting state) =  52 %.  Left Ventricular End Diastolic Volume: 409 mL       SUMMARY/OPINION:   This study is borderline abnormal with limited reversible uptake in the inferior wall.  All segments are viable.  Global left ventricular function is within normal limits. In aggregate the current study is low risk in regards to predicted annual cardiovascular mortality rate.         Hypercholesterolemia      A.  02/28/01 total 324 total 249 HDL 43 LDL 231 Pt deferred meds  B. 06/29/04 total 273 trig 193 HDL 36 LDLd206 Zocor begun dc'd by pt d/t statinphobia and cost  C.  2/24 Rx Vytorin 10/80 1/2 tab.      Hypothyroidism  A.  2004 presentation with TSH over 200, Rx w l-thyroxine      Hypertension      A.  2/06 Altace 5 dc'd w/ dysgeusia, Metoprolol 25 BID begun         Tests ordered      A. 07/17/08 carotid artery duplex scan: no significant stenosis           Review of Systems   Constitutional: Negative.   HENT: Negative.     Eyes: Negative.    Cardiovascular: Negative.    Respiratory: Negative.     Endocrine: Negative.    Hematologic/Lymphatic: Negative.    Skin: Negative.    Musculoskeletal: Negative.    Gastrointestinal: Negative.    Genitourinary: Negative.    Neurological: Negative.    Psychiatric/Behavioral: Negative.     Allergic/Immunologic: Negative.        Physical Exam  GENERAL: The patient is well developed, well nourished, resting comfortably and in no distress.    HEENT: No abnormalities of the visible oro-nasopharynx, conjunctiva or sclera are noted.    NECK: There is no jugular venous distension. Carotids are palpable and without bruits. There is no thyroid enlargement.    Chest: Lung fields are clear to auscultation. There are no wheezes or crackles.    CV: There is a regular rhythm. The first and second heart sounds are normal. A grade 3/6 systolic murmur is heard, loudest in the right upper sternal area. There are no murmurs, gallops or rubs.  I checked his apical heart rate and it was 64 bpm.  ABD: The abdomen is soft and supple with normal bowel sounds. There is no hepatosplenomegaly, ascites, tenderness, masses or bruits.    Neuro: There are no focal motor defects. Ambulation is normal. Cognitive function appears normal.    Ext: There is trace bipedal edema without evidence of deep vein thrombosis.  Peripheral pulses are decreased but he has normal capillary refill and no ischemic ulcers.  His recent left femoral artery catheterization site looks excellent without bleeding or hematoma.  SKIN: No rashes or cellulitis.  PSYCH: The patient is calm, rationale and oriented.    Cardiovascular Studies  A twelve-lead ECG obtained on June 23, 2022 shows an AV dual paced rhythm with a heart rate of 62 bpm.  Transesophageal echo 06/05/2022:  Interpretation Summary     Normal left ventricular cavity size and wall thickness  Normal ejection fraction estimated at 55%  Moderate to severe left atrial dilatation  Well-seated Watchman device with no evidence of peridevice leak  Mild mitral regurgitation  Calcified aortic valve with restricted leaflet mobility.  Moderate aortic stenosis.  Significant atheromatous plaque in descending aorta  No pericardial effusion     Coronary angiography/intervention 06/22/2022:  CORONARY ANATOMY AND FINDINGS:    Left main coronary artery:  The left main coronary artery arises from the left coronary sinus.  The left main distally trifurcates into a left anterior descending artery, a ramus intermedius, and left circumflex coronary artery.  The left main was calcified and in the mid segment has 50% stenosis that advanced to 70% distally.  Left anterior descending artery:  The left anterior descending artery is a medium to large caliber vessel that gives rise to 3 diagonal branches.  The proximal segment of the left anterior descending artery is calcified with 80% segmental stenosis.  Distal flow beyond this area came from left internal mammary artery graft and also native flow.  There are three medium-sized diagonals that arises from the  left anterior descending artery and the three of them had mild diffuse disease.  Left circumflex coronary artery:  The left circumflex coronary artery appears to be a medium caliber vessel ostially occluded with distal flow coming from both left left-to-left and right-to-left collaterals.  Ramus intermedius:  There is what appears to be a medium caliber ramus intermedius that in the ostium has 80% stenosis.  Right coronary artery:  The right coronary artery arises from the right coronary sinus and proximally 100% occluded.  Distal flow came from the free radial graft to the RPDA branch.  The right coronary artery appears to be dominant.     BYPASS GRAFT ANATOMY:    Free radial graft to RPDA:  This graft between the origin in the aorta and the distal insertion to the posterior descending artery is noted proximally to have a 70% to 80% stenosis.  Left internal mammary artery to left anterior descending artery and right internal mammary artery Y-graft to obtuse marginal branch:  The entire segment of this graft from the origin to the body and the distal insertion was patent without any angiographically significant disease.  The right internal mammary artery Y-graft that takes off from the distal one fourth of the vessel is also patent.  Both vessels supply medium to large caliber vessel that were distally patent without any angiographically significant disease.      LESION SPECIFIC INFORMATION:    Lesion location:  Proximal free radial to RPDA.  Preintervention stenosis severity: 70% to 80%.  Postintervention stenosis severity:  0%.  Preintervention distal TIMI flow:  3.  Post intervention distal TIMI flow:  3.  ACC/AHA type of lesion:  B2.   My attending, Dr. Micheline Rough, was present throughout the entire duration of procedure performing key aspects of the procedure when necessary.     FINAL IMPRESSION:    Severe multivessel coronary artery disease.  Patent left internal mammary artery to left anterior descending artery with RIMA free Y-graft to obtuse marginal branch.  Severe stenosis of proximal free radial graft to RPDA that was hemodynamically significant by RFR of 0.87.  Successful percutaneous coronary intervention of proximal free radial graft to RPDA with placement of 1 drug-eluting stent.  IVUS was used for stent optimization.     RECOMMENDATIONS:    Routine post cardiac catheterization care.  Aspirin 81 mg daily for chronically as long as tolerated.  Effient 10 mg daily for 6 months and reassess.    Cardiovascular Health Factors  Vitals BP Readings from Last 3 Encounters:   12/05/22 136/70   06/29/22 102/64   06/23/22 (!) 151/65     Wt Readings from Last 3 Encounters:   12/05/22 100.5 kg (221 lb 9.6 oz)   06/29/22 97 kg (213 lb 12.8 oz)   06/22/22 98.2 kg (216 lb 6.4 oz)     BMI Readings from Last 3 Encounters:   12/05/22 30.91 kg/m?   06/29/22 29.82 kg/m?   06/22/22 30.18 kg/m?      Smoking Social History     Tobacco Use   Smoking Status Former    Current packs/day: 0.00    Average packs/day: 1 pack/day for 40.0 years (40.0 ttl pk-yrs)    Types: Cigarettes    Start date: 06/02/1964    Quit date: 06/02/2004    Years since quitting: 18.5   Smokeless Tobacco Never      Lipid Profile Cholesterol   Date Value Ref Range Status   11/01/2022 72  Final  HDL   Date Value Ref Range Status   11/01/2022 41  Final     LDL   Date Value Ref Range Status   11/01/2022 13  Final     Triglycerides   Date Value Ref Range Status   11/01/2022 93  Final      Blood Sugar Hemoglobin A1C   Date Value Ref Range Status   04/12/2021 6.8 (H) <5.6 Final     Glucose   Date Value Ref Range Status   11/01/2022 119 (H) 70 - 105 Final   06/23/2022 109 (H) 70 - 100 MG/DL Final   09/81/1914 782 (H) 70 - 105 Final   07/04/2004 121 (H) 70 - 110 MG/DL Final   95/62/1308 657 (H) 70 - 110 MG/DL Final   84/69/6295 284 (H) 70 - 110 MG/DL Final     Glucose, POC   Date Value Ref Range Status   06/22/2022 111 (H) 70 - 100 MG/DL Final   13/24/4010 272 (H) 70 - 100 MG/DL Final   53/66/4403 474 (H) 70 - 100 MG/DL Final          Problems Addressed Today  Encounter Diagnoses   Name Primary?    Primary hypertension     Hypercholesterolemia     Coronary artery disease due to lipid rich plaque     PAF (paroxysmal atrial fibrillation) (HCC)     Typical atrial flutter (HCC)     Paroxysmal atrial fibrillation (HCC)     DM (diabetes mellitus) type II, controlled, with peripheral vascular disorder (HCC)        Assessment and Plan   I had a long talk with Mr. Ordway.  He is quite certain that he is stable.  He reports only infrequent mild angina with extended walking that has not progressed in recent years.  He has moderate aortic valve stenosis and will likely require aortic valve replacement eventually.  I have asked him to repeat his echo Doppler study just before his next clinic visit in 6 months.  A clinician patient risk discussion was held today concerning dual antiplatelet therapy.  He wants to stop his prasugrel on December 22, 2022, 6 months after he underwent coronary stenting for stable ischemic coronary artery disease.  He was instructed to continue aspirin 81 mg indefinitely.  Alternatives for the treatment of hypercholesterolemia were reviewed with the patient he wanted to decrease his rosuvastatin to 20 mg daily since his LDL cholesterol is so well-controlled with Repatha. The total time spent during this interview and exam with preparation and chart review was 30 minutes.           Current Medications (including today's revisions)   amLODIPine (NORVASC) 5 mg tablet TAKE 1 TABLET TWICE DAILY    aspirin EC (ASPIR-LOW) 81 mg tablet Take one tablet by mouth daily. Indications: s/p Watchman    calcium carbonate (TUMS) 500 mg (200 mg elemental calcium) chewable tablet Chew one tablet by mouth daily as needed.    CHOLEcalciferoL (vitamin D3) (VITAMIN D3) 50 mcg (2,000 unit) tablet Take one tablet by mouth every 48 hours.    coenzyme Q10 100 mg cap Take one capsule by mouth three times weekly.    CRANBERRY FRUIT EXTRACT (CRANBERRY PO) Take 4,200 mg by mouth daily.    cyanocobalamin (vitamin B-12) 500 mcg tablet Take one tablet by mouth every 48 hours.    empagliflozin (JARDIANCE) 25 mg tablet Take one tablet by mouth daily.    evolocumab (REPATHA SURECLICK) 140 mg/mL  injectable PEN Inject 1 mL under the skin every 14 days.    ezetimibe (ZETIA) 10 mg tablet TAKE 1 TABLET EVERY DAY    ferrous sulfate (FEOSOL) 325 mg (65 mg iron) tablet Take one tablet by mouth three times weekly. Take on an empty stomach at least 1 hour before or 2 hours after food.    furosemide (LASIX) 40 mg tablet TAKE ONE TABLET DAILY. MAY TAKE AN ADDITIONAL LASIX AS NEEDED (DOSE ADJUSTMENT) (Patient taking differently: every 48 hours.)    glucosamine/chondro su A/C/Mn (GLUCOSAMINE-CHONDROITIN COMPLX PO) Take 1 tablet by mouth daily.    hydrALAZINE (APRESOLINE) 50 mg tablet TAKE 1 TABLET TWICE DAILY    levothyroxine (SYNTHROID) 175 mcg tablet Take one tablet by mouth daily 30 minutes before breakfast.    losartan (COZAAR) 100 mg tablet TAKE 1 TABLET EVERY DAY    metFORMIN (GLUCOPHAGE) 500 mg tablet Take one tablet by mouth twice daily with meals. Ok to resume 06/21/18    omeprazole DR(+) (PRILOSEC) 20 mg PO capsule Take one capsule by mouth daily.    prasugreL (EFFIENT) 10 mg tablet Take one tablet by mouth daily. (Patient taking differently: Take one-half tablet by mouth daily.)    rosuvastatin (CRESTOR) 40 mg tablet TAKE 1 TABLET EVERY DAY (Patient taking differently: Take one tablet by mouth daily.)    spironolactone (ALDACTONE) 25 mg tablet TAKE 1 TABLET EVERY DAY WITH FOOD    vitamins, multiple tablet Take one tablet by mouth every 48 hours.

## 2022-12-14 ENCOUNTER — Encounter: Admit: 2022-12-14 | Discharge: 2022-12-14 | Payer: MEDICARE

## 2022-12-17 ENCOUNTER — Encounter: Admit: 2022-12-17 | Discharge: 2022-12-17 | Payer: MEDICARE

## 2022-12-18 ENCOUNTER — Encounter: Admit: 2022-12-18 | Discharge: 2022-12-18 | Payer: MEDICARE

## 2022-12-19 ENCOUNTER — Encounter: Admit: 2022-12-19 | Discharge: 2022-12-19 | Payer: MEDICARE

## 2022-12-19 MED FILL — REPATHA SURECLICK 140 MG/ML SC PNIJ: 140 mg/mL | SUBCUTANEOUS | 28 days supply | Qty: 2 | Fill #7 | Status: AC

## 2023-01-05 ENCOUNTER — Encounter: Admit: 2023-01-05 | Discharge: 2023-01-05 | Payer: MEDICARE

## 2023-01-08 ENCOUNTER — Encounter: Admit: 2023-01-08 | Discharge: 2023-01-08 | Payer: MEDICARE

## 2023-01-09 ENCOUNTER — Encounter: Admit: 2023-01-09 | Discharge: 2023-01-09 | Payer: MEDICARE

## 2023-01-09 MED FILL — REPATHA SURECLICK 140 MG/ML SC PNIJ: 140 mg/mL | SUBCUTANEOUS | 28 days supply | Qty: 2 | Fill #8 | Status: AC

## 2023-01-13 ENCOUNTER — Encounter: Admit: 2023-01-13 | Discharge: 2023-01-13 | Payer: MEDICARE

## 2023-01-13 MED ORDER — LOSARTAN 100 MG PO TAB
ORAL_TABLET | 3 refills
Start: 2023-01-13 — End: ?

## 2023-01-21 ENCOUNTER — Encounter: Admit: 2023-01-21 | Discharge: 2023-01-21 | Payer: MEDICARE

## 2023-01-21 MED ORDER — FUROSEMIDE 40 MG PO TAB
ORAL_TABLET | 3 refills
Start: 2023-01-21 — End: ?

## 2023-02-02 ENCOUNTER — Encounter: Admit: 2023-02-02 | Discharge: 2023-02-02 | Payer: MEDICARE

## 2023-02-07 ENCOUNTER — Encounter: Admit: 2023-02-07 | Discharge: 2023-02-07 | Payer: MEDICARE

## 2023-02-08 MED FILL — REPATHA SURECLICK 140 MG/ML SC PNIJ: 140 mg/mL | SUBCUTANEOUS | 28 days supply | Qty: 2 | Fill #9 | Status: AC

## 2023-02-21 ENCOUNTER — Encounter: Admit: 2023-02-21 | Discharge: 2023-02-21 | Payer: MEDICARE

## 2023-02-21 MED ORDER — SPIRONOLACTONE 25 MG PO TAB
ORAL_TABLET | ORAL | 3 refills | 90.00000 days | Status: AC
Start: 2023-02-21 — End: ?

## 2023-03-03 ENCOUNTER — Encounter: Admit: 2023-03-03 | Discharge: 2023-03-03 | Payer: MEDICARE

## 2023-03-06 ENCOUNTER — Encounter: Admit: 2023-03-06 | Discharge: 2023-03-06 | Payer: MEDICARE

## 2023-03-06 MED FILL — REPATHA SURECLICK 140 MG/ML SC PNIJ: 140 mg/mL | SUBCUTANEOUS | 28 days supply | Qty: 2 | Fill #10 | Status: AC

## 2023-03-16 ENCOUNTER — Encounter: Admit: 2023-03-16 | Discharge: 2023-03-16 | Payer: MEDICARE

## 2023-03-16 NOTE — Telephone Encounter
Discussed recommendations with patient. Patient states that he has been noticing more exertional symptoms. He states that he is stable he is just starting to notice more symptoms of SOB and small chest pains and he would like a sooner appointment with [facility] and echocardiogram. He would like to postpone the CT until his other symptoms are addressed.      Called and discussed with Dr. Arna Medici. He recommends for patient to be worked into clinic with echocardiogram prior.           Discussed recommendations with patient. He verbalized understanding. He verified D-T-L of appointments.

## 2023-03-16 NOTE — Telephone Encounter
-----   Message from Wesley Blas, MD sent at 03/16/2023  7:54 AM CDT -----  Regarding: Chest CT  Brett Canales and Adelina Mings: Please order a follow-up chest CT for Mr. Ekholm as recommended.  Please chest CT document recommendations and instructions to the patient.  Please let me know after the test has been completed.  Thanks.  SBG  ----- Message -----  From: Crista Luria, RN  Sent: 03/16/2023   7:09 AM CDT  To: Hester Mates, MD    Dr. Arna Medici,     Mr. Meuth had recommendations to follow up with a CT chest in 1 year. I am happy to forward this on to the the PCP if that is more appropriate. Is this something that warrants follow up? Just let me know either way!      Please review the attached imaging report, this patient has an overdue follow up imaging recommendation based on findings by the Radiologist. Please respond with one of the following:    I reviewed the chart and the requested follow up imaging/test and...  A. Per clinical scenario/patient discussion, follow up imaging is not medically necessary.  B. Follow up imaging is appropriate, exam has been ordered and clinic personnel will communicate with the patient. (Please provide the clinic personnel's name)  C. Follow up imaging is appropriate, but the patient is no longer under our care. Defer to another physician. (Please provide the name of the physician, if known)  D. This is not my patient. Please defer to the appropriate provider.   E. Follow up recommendation has been completed at another facility. (Please provide the date and name of the facility or document in the EMR)  F. Patient is aware of the findings and has declined further follow up.         Please contact us directly if you have questions:    Crista Luria, RN, [place] [address], [place] [address], [place] [address]    [facility] #Follow

## 2023-03-21 ENCOUNTER — Encounter: Admit: 2023-03-21 | Discharge: 2023-03-21 | Payer: MEDICARE

## 2023-03-21 NOTE — Progress Notes
STAT request please     Patient DOB: 06-16-1946        Please fax the following information for continuation of care:   Most recent office note  Most recent labs    Patient has an upcoming appointment with Dr. Arna Medici        *Please fax to [phone]     Thank you!  Pleas Koch, MA

## 2023-03-26 ENCOUNTER — Inpatient Hospital Stay: Admit: 2023-03-26 | Discharge: 2023-03-26 | Payer: MEDICARE

## 2023-03-26 ENCOUNTER — Encounter: Admit: 2023-03-26 | Discharge: 2023-03-26 | Payer: MEDICARE

## 2023-03-26 ENCOUNTER — Inpatient Hospital Stay: Admit: 2023-03-26 | Payer: MEDICARE

## 2023-03-26 ENCOUNTER — Ambulatory Visit: Admit: 2023-03-26 | Discharge: 2023-03-26 | Payer: MEDICARE

## 2023-03-26 DIAGNOSIS — E039 Hypothyroidism, unspecified: Secondary | ICD-10-CM

## 2023-03-26 DIAGNOSIS — I2510 Atherosclerotic heart disease of native coronary artery without angina pectoris: Secondary | ICD-10-CM

## 2023-03-26 DIAGNOSIS — I5032 Chronic diastolic (congestive) heart failure: Secondary | ICD-10-CM

## 2023-03-26 DIAGNOSIS — I483 Typical atrial flutter: Secondary | ICD-10-CM

## 2023-03-26 DIAGNOSIS — I480 Paroxysmal atrial fibrillation: Secondary | ICD-10-CM

## 2023-03-26 DIAGNOSIS — R079 Chest pain, unspecified: Secondary | ICD-10-CM

## 2023-03-26 DIAGNOSIS — E7800 Pure hypercholesterolemia, unspecified: Secondary | ICD-10-CM

## 2023-03-26 DIAGNOSIS — I10 Essential (primary) hypertension: Secondary | ICD-10-CM

## 2023-03-26 DIAGNOSIS — I4891 Unspecified atrial fibrillation: Secondary | ICD-10-CM

## 2023-03-26 DIAGNOSIS — J449 Chronic obstructive pulmonary disease, unspecified: Secondary | ICD-10-CM

## 2023-03-26 DIAGNOSIS — Z95818 Presence of other cardiac implants and grafts: Secondary | ICD-10-CM

## 2023-03-26 DIAGNOSIS — I359 Nonrheumatic aortic valve disorder, unspecified: Secondary | ICD-10-CM

## 2023-03-26 DIAGNOSIS — R9439 Abnormal result of other cardiovascular function study: Secondary | ICD-10-CM

## 2023-03-26 DIAGNOSIS — Z0189 Encounter for other specified special examinations: Secondary | ICD-10-CM

## 2023-03-26 DIAGNOSIS — J9621 Acute and chronic respiratory failure with hypoxia: Secondary | ICD-10-CM

## 2023-03-26 DIAGNOSIS — Z136 Encounter for screening for cardiovascular disorders: Secondary | ICD-10-CM

## 2023-03-26 DIAGNOSIS — Z950 Presence of cardiac pacemaker: Secondary | ICD-10-CM

## 2023-03-26 DIAGNOSIS — E1151 Type 2 diabetes mellitus with diabetic peripheral angiopathy without gangrene: Secondary | ICD-10-CM

## 2023-03-26 LAB — COMPREHENSIVE METABOLIC PANEL
ALBUMIN: 4.3 g/dL — ABNORMAL HIGH (ref 3.5–5.0)
ALK PHOSPHATASE: 81 U/L (ref 25–110)
ALT: 28 U/L (ref 7–56)
ANION GAP: 12 10*3/uL — ABNORMAL HIGH (ref 3–12)
AST: 23 U/L (ref 7–40)
CHLORIDE: 105 MMOL/L (ref 98–110)
CO2: 23 MMOL/L (ref 21–30)
EGFR: >60 mL/min (ref >60)
SODIUM: 140 MMOL/L (ref 137–147)
TOTAL BILIRUBIN: 0.4 mg/dL (ref 0.2–1.3)

## 2023-03-26 LAB — PTT (APTT): PTT: 30 s (ref 24.0–36.5)

## 2023-03-26 LAB — MAGNESIUM: MAGNESIUM: 1.6 mg/dL (ref 1.6–2.6)

## 2023-03-26 LAB — HIGH SENSITIVITY TROPONIN I 0 HOUR: HIGH SENSITIVITY TROPONIN I 0 HOUR: 49 ng/L — ABNORMAL HIGH (ref <20)

## 2023-03-26 LAB — CBC AND DIFF
ABSOLUTE BASO COUNT: 0.1 10*3/uL (ref 0–0.20)
WBC COUNT: 8.8 10*3/uL (ref 4.5–11.0)

## 2023-03-26 LAB — HIGH SENSITIVITY TROPONIN I 2 HOUR: HIGH SENSITIVITY TROPONIN I 2 HOUR: 54 ng/L — ABNORMAL HIGH (ref <20)

## 2023-03-26 LAB — PROTIME INR (PT): PROTIME: 11 s (ref 10.2–12.9)

## 2023-03-26 LAB — TSH WITH FREE T4 REFLEX: TSH: 0.6 uU/mL — ABNORMAL HIGH (ref 0.35–5.00)

## 2023-03-26 LAB — POC GLUCOSE: POC GLUCOSE: 126 mg/dL — ABNORMAL HIGH (ref 70–100)

## 2023-03-26 MED ORDER — INSULIN ASPART 100 UNIT/ML SC FLEXPEN
0-6 [IU] | Freq: Before meals | SUBCUTANEOUS | 0 refills | Status: AC
Start: 2023-03-26 — End: ?

## 2023-03-26 MED ORDER — PERFLUTREN LIPID MICROSPHERES 1.1 MG/ML IV SUSP
1-10 mL | Freq: Once | INTRAVENOUS | 0 refills | Status: CP | PRN
Start: 2023-03-26 — End: ?
  Administered 2023-03-26: 14:00:00 1 mL via INTRAVENOUS

## 2023-03-26 MED ORDER — FERROUS SULFATE 325 MG (65 MG IRON) PO TAB
325 mg | ORAL | 0 refills | Status: AC
Start: 2023-03-26 — End: ?
  Administered 2023-03-28: 14:00:00 325 mg via ORAL

## 2023-03-26 MED ORDER — ASPIRIN 81 MG PO TBEC
81 mg | Freq: Every day | ORAL | 0 refills | Status: AC
Start: 2023-03-26 — End: ?
  Administered 2023-03-28: 14:00:00 81 mg via ORAL

## 2023-03-26 MED ORDER — CYANOCOBALAMIN (VITAMIN B-12) 500 MCG PO TAB
500 ug | ORAL | 0 refills | Status: AC
Start: 2023-03-26 — End: ?
  Administered 2023-03-27: 14:00:00 500 ug via ORAL

## 2023-03-26 MED ORDER — PANTOPRAZOLE 20 MG PO TBEC
20 mg | Freq: Every day | ORAL | 0 refills | Status: AC
Start: 2023-03-26 — End: ?
  Administered 2023-03-27 – 2023-03-28 (×2): 20 mg via ORAL

## 2023-03-26 MED ORDER — AMLODIPINE 5 MG PO TAB
5 mg | Freq: Two times a day (BID) | ORAL | 0 refills | Status: AC
Start: 2023-03-26 — End: ?
  Administered 2023-03-27 – 2023-03-28 (×4): 5 mg via ORAL

## 2023-03-26 MED ORDER — MAGNESIUM SULFATE IN D5W 1 GRAM/100 ML IV PGBK
1 g | INTRAVENOUS | 0 refills | Status: AC
Start: 2023-03-26 — End: ?
  Administered 2023-03-27: 01:00:00 1 g via INTRAVENOUS

## 2023-03-26 MED ORDER — ASPIRIN 325 MG PO TAB
325 mg | Freq: Once | ORAL | 0 refills | Status: AC
Start: 2023-03-26 — End: ?
  Administered 2023-03-27: 14:00:00 325 mg via ORAL

## 2023-03-26 MED ORDER — IMS MIXTURE TEMPLATE
175 ug | Freq: Every day | ORAL | 0 refills | Status: AC
Start: 2023-03-26 — End: ?
  Administered 2023-03-27 – 2023-03-28 (×4): 175 ug via ORAL

## 2023-03-26 MED ORDER — ALBUTEROL SULFATE 90 MCG/ACTUATION IN HFAA
2 | RESPIRATORY_TRACT | 0 refills | Status: AC | PRN
Start: 2023-03-26 — End: ?

## 2023-03-26 MED ORDER — IOHEXOL 350 MG IODINE/ML IV SOLN
100 mL | Freq: Once | INTRAVENOUS | 0 refills | Status: CP
Start: 2023-03-26 — End: ?
  Administered 2023-03-27: 02:00:00 100 mL via INTRAVENOUS

## 2023-03-26 MED ORDER — FUROSEMIDE 40 MG PO TAB
40 mg | Freq: Every day | ORAL | 0 refills | Status: AC
Start: 2023-03-26 — End: ?
  Administered 2023-03-28: 14:00:00 40 mg via ORAL

## 2023-03-26 MED ORDER — ROSUVASTATIN 20 MG PO TAB
20 mg | Freq: Every day | ORAL | 0 refills | Status: AC
Start: 2023-03-26 — End: ?
  Administered 2023-03-27 – 2023-03-28 (×2): 20 mg via ORAL

## 2023-03-26 MED ORDER — HYDRALAZINE 50 MG PO TAB
50 mg | Freq: Two times a day (BID) | ORAL | 0 refills | Status: AC
Start: 2023-03-26 — End: ?
  Administered 2023-03-27 – 2023-03-28 (×3): 50 mg via ORAL

## 2023-03-26 MED ORDER — SODIUM CHLORIDE 0.9 % IJ SOLN
50 mL | Freq: Once | INTRAVENOUS | 0 refills | Status: CP
Start: 2023-03-26 — End: ?
  Administered 2023-03-27: 02:00:00 50 mL via INTRAVENOUS

## 2023-03-26 MED ORDER — DEXTROSE 50 % IN WATER (D50W) IV SYRG
12.5-25 g | INTRAVENOUS | 0 refills | Status: AC | PRN
Start: 2023-03-26 — End: ?

## 2023-03-26 MED ORDER — LOSARTAN 50 MG PO TAB
100 mg | Freq: Every day | ORAL | 0 refills | Status: AC
Start: 2023-03-26 — End: ?
  Administered 2023-03-27 – 2023-03-28 (×2): 100 mg via ORAL

## 2023-03-26 MED ORDER — SODIUM CHLORIDE 0.9 % IJ SOLN
10 mL | Freq: Once | INTRAVENOUS | 0 refills | Status: CP
Start: 2023-03-26 — End: ?
  Administered 2023-03-26: 14:00:00 10 mL via INTRAVENOUS

## 2023-03-26 MED ORDER — SODIUM CHLORIDE 0.9 % IV SOLP
250 mL | INTRAVENOUS | 0 refills | Status: AC
Start: 2023-03-26 — End: ?

## 2023-03-26 MED ORDER — EZETIMIBE 10 MG PO TAB
10 mg | Freq: Every day | ORAL | 0 refills | Status: AC
Start: 2023-03-26 — End: ?
  Administered 2023-03-27 – 2023-03-28 (×2): 10 mg via ORAL

## 2023-03-26 MED ORDER — NITROGLYCERIN 0.4 MG SL SUBL
.4 mg | SUBLINGUAL | 0 refills | Status: AC | PRN
Start: 2023-03-26 — End: ?

## 2023-03-26 MED ORDER — SPIRONOLACTONE 25 MG PO TAB
25 mg | Freq: Every day | ORAL | 0 refills | Status: AC
Start: 2023-03-26 — End: ?
  Administered 2023-03-27 – 2023-03-28 (×2): 25 mg via ORAL

## 2023-03-26 MED ORDER — AMLODIPINE 5 MG PO TAB
5 mg | Freq: Every day | ORAL | 0 refills | Status: DC
Start: 2023-03-26 — End: 2023-03-26

## 2023-03-26 NOTE — Progress Notes
RT Adult Assessment Note    NAME:Virginia ARVIS ZWAHLEN             MRN: [id number]             DOB:1946/03/18          AGE: 77 y.o.  ADMISSION DATE: 03/26/2023             DAYS ADMITTED: LOS: 0 days    Additional Comments:  Impressions of the patient: Patient resting in bed. No visible signs of respiratory distress noted at this time.   Intervention(s)/outcome(s): RT Eval  Patient education that was completed: N/A  Recommendations to the care team: None at this time.     Vital Signs:  Pulse: 82  RR: 18 PER MINUTE  SpO2: 94 %  O2 Device: None (Room air)  Breath Sounds WDL: Within Defined Limits  Respiratory WDL: Within Defined Limits  Comments:

## 2023-03-26 NOTE — H&P (View-Only)
Admission History and Physical Examination      Name: Mason Lawson   MRN: [id number]     DOB: 07-28-1945      Age: 77 y.o.  Admission Date: (Not on file)     LOS: 0 days     Date of Service: 03/26/2023                        Assessment & Plan  Aortic valve disease  Coronary artery disease  Hypertension  Atrial flutter (HCC) s/p Watchman   HLD  The aortic valve is very sclerotic with severely restricted leaflet motion seen on echo 03/26/23. Previously noted with progression of disease on echo and worsening symptoms.  We discussed options including inpatient admission with further workup and stratification of possible coronary artery disease with need for revascularization as well as additional periprocedural diagnostic testing to understand degree of aortic stenosis and best treatment options.    Plan:  - Diabetic cardiac diet, n.p.o. at midnight with plan for diagnostic left heart catheterization with possible PCI  - Consults interventional cardiology and cardiothoracic surgery for valvular disease and discussion of treatment options  - Continue prior to admission antihypertensive agents including amlodipine 5 mg twice daily, hydralazine 50 mg twice daily, spironolactone 25 mg daily, losartan 100 mg daily  - Continue aspirin 81 mg  - Continue Crestor 20 mg daily, patient is on PTA Repatha, continue Zetia  - Preoperative diagnostic studies with PFTs, carotid ultrasound, CTA chest abdomen pelvis  Hypothyroidism  Patient is on prior to admission levothyroxine  - Continue PTA dosing, check thyroid function  DM (diabetes mellitus) type II, controlled, with peripheral vascular disorder (HCC)  Patient with a history of diabetes has been well-controlled's on metformin.  Last A1c 6.8 1-year ago.  Reports eating a carb consistent diet at home and taking his medication as prescribed    Plan:  - Hold metformin in periprocedural setting, low-dose correction factor diabetic diet  - Risk stratification with A1c and repeat lipid panel on admission labs  Cardiac pacemaker  Status post [employer] dual-chamber pacemaker implantation with Dr. Derrell Lolling was in July 2022    -Plan for device interrogation  -Baseline chest x-ray    Patient seen by and discussed with Renato Shin, DO    Jamie Brookes, MD  Internal Medicine Resident   Available on Voalte: 24/7 [facility] First Call Cardiology Service Provider or Pager [phone]    _____________________________________________________________________________    Primary Care Physician: Hoyt Koch  Verified    Chief Complaint:  Fatigue, shortness of breath  History of Present Illness: Mason Lawson is a 77 y.o. male with a past medical history notable for coronary artery disease, sinus bradycardia and sick sinus syndrome status post dual-chamber pacemaker placement, peripheral artery disease, hyperlipidemia, aortic stenosis, hypertension, heart failure with preserved ejection fraction, paroxysmal atrial fibrillation status post watchmaker placement who presented as a direct admission from the cardiology clinic after he was seeing his primary cardiologist Dr. Justice Britain today 03/26/2023.    His history is well-documented in Dr. Quillian Quince progress note dated same as today.  In short, he has been having progressive exertional chest discomforts and increasing fatigue with activity.  Episodes last for approximately 15 minutes and resolved with rest.  They have been cantering more frequently and are on the left side and with a sensation of pressure.  He had been generally well-controlled with volume status using oral Lasix as needed.  He has needed increasing  amount of this in recent time.  He denies having syncopal episodes bleeding or flulike symptoms.    His relevant prior cardiac history is notable for prior CABG in December 2005 with revascularization of IMA graft to the LAD with right internal mammary to the obtuse marginal and a radial artery to the posterior descending artery.  He has had prior stress testing which was abnormal and underwent cardiac catheterization in 2012 which did not require PCI.  He has been uptitrating on his GDMT and has had resistant hypertension.  He underwent repeat left heart catheterization and coronary angiography in December 2019 again not requiring intervention.  He was hospitalized for atrial flutter with bradycardic response in July 2022 and underwent an ablation of the cavotricuspid isthmus with Dr. Wallene Huh.  Post ablation he had sinus node dysfunction with sinus arrest requiring implantation of a dual-chamber pacemaker with Dr. Reola Mosher.  He underwent placement of a Watchman left atrial appendage occlusion device in August 2023 and follow-up TEE demonstrated appropriate seating of the device.  He is not currently taking anticoagulation for his atrial fibrillation.  His permanent pacemaker revealed episodes of NSVT and stress tests was obtained in December 2023 which was abnormal requiring PCI to the right posterior descending.    Past Medical History:   Diagnosis Date    Abnormal stress test 09/26/2010    Atrial fibrillation (HCC) 05/13/2021    Cardiac pacemaker 05/16/2021    Chest pain 09/26/2010    COPD (chronic obstructive pulmonary disease) (HCC)     Coronary artery disease 06/23/2009    Hypercholesterolemia 06/23/2009    Hypertension 06/23/2009    Hypothyroidism 06/23/2009    Presence of Watchman left atrial appendage closure device 02/21/2022    02/20/2022 - Dr. Milas Kocher; Left atrial appendage occlusion with 31 mm Watchman FLX device    Tests ordered 06/23/2009     Surgical History:   Procedure Laterality Date    HX CORONARY ARTERY BYPASS GRAFT  2005    x3 vessels    HEART CATHETERIZATION  2005    HEART CATHETERIZATION  2012    HEART CATHETERIZATION  2019    ANGIOGRAPHY CORONARY ARTERY WITH RIGHT AND LEFT HEART CATHETERIZATION N/A 06/18/2018    Performed by Nat Math, MD, FACC at [facility] CATH LAB    POSSIBLE PERCUTANEOUS CORONARY STENT PLACEMENT WITH ANGIOPLASTY N/A 06/18/2018    Performed by Nat Math, MD, FACC at [facility] CATH LAB    ABLATION OF DYSRHYTHMIC FOCUS  01/21/2021    PACEMAKER INSERTION  01/21/2021    INSERTION/ REPLACEMENT PERMANENT PACEMAKER WITH ATRIAL AND VENTRICULAR LEAD Left 01/21/2021    Performed by Lorain Childes, MD at [facility] EP LAB    INTRACARDIAC CATHETER ABLATION WITH COMPREHENSIVE ELECTROPHYSIOLOGIC EVALUATION - TYPICAL FLUTTER  01/21/2021    Performed by Jen Mow, MD at [facility] EP LAB    PERCUTANEOUS CLOSURE LEFT ATRIAL APPENDAGE WITH ENDOCARDIAL IMPLANT, TRANSEPTAL CATHETERIZATION, FOLEY, CONTRAST ANTICIPATED N/A 02/20/2022    Performed by Annamarie Dawley, MD at [facility] EP LAB    INTRACARDIAC ECHOCARDIOGRAPHY  02/20/2022    Performed by Annamarie Dawley, MD at [facility] EP LAB    PERCUTANEOUS CORONARY STENT PLACEMENT WITH ANGIOPLASTY N/A 06/22/2022    Performed by Reola Mosher, MD at [facility] CATH LAB    ANGIOGRAPHY CORONARY ARTERY AND ARTERIAL/VENOUS GRAFTS WITH LEFT HEART CATHETERIZATION N/A 06/22/2022    Performed by Reola Mosher, MD at [facility] CATH LAB     Family history reviewed; non-contributory  Social History  Tobacco Use    Smoking status: Former     Current packs/day: 0.00     Average packs/day: 1 pack/day for 40.0 years (40.0 ttl pk-yrs)     Types: Cigarettes     Start date: 06/02/1964     Quit date: 06/02/2004     Years since quitting: 18.8    Smokeless tobacco: Never   Vaping Use    Vaping status: Never Used   Substance and Sexual Activity    Alcohol use: Yes     Alcohol/week: 10.0 standard drinks of alcohol     Types: 10 Drinks containing 0.5 oz of alcohol per week    Drug use: No    Sexual activity: Not Currently     Partners: Female             Immunizations (includes history and patient reported):   Immunization History   Administered Date(s) Administered    COVID-19 (MODERNA), mRNA vacc, 100 mcg/0.5 mL (PF) 10/28/2019, 11/28/2019, 06/04/2020    COVID-19 Bivalent (35YR+)(PFIZER), mRNA vacc, 56mcg/0.3mL 05/02/2021           Allergies:  Penicillins and Percodan [oxycodone hcl-oxycodone-asa]    Medications:  Medications Prior to Admission   Medication Sig    amLODIPine (NORVASC) 5 mg tablet TAKE 1 TABLET TWICE DAILY    aspirin EC (ASPIR-LOW) 81 mg tablet Take one tablet by mouth daily. Indications: s/p Watchman    calcium carbonate (TUMS) 500 mg (200 mg elemental calcium) chewable tablet Chew one tablet by mouth daily as needed.    CHOLEcalciferoL (vitamin D3) (VITAMIN D3) 50 mcg (2,000 unit) tablet Take one tablet by mouth every 48 hours.    coenzyme Q10 100 mg cap Take one capsule by mouth three times weekly.    CRANBERRY FRUIT EXTRACT (CRANBERRY PO) Take 4,200 mg by mouth daily.    cyanocobalamin (vitamin B-12) 500 mcg tablet Take one tablet by mouth every 48 hours.    evolocumab (REPATHA SURECLICK) 140 mg/mL injectable PEN Inject 1 mL under the skin every 14 days.    ezetimibe (ZETIA) 10 mg tablet TAKE 1 TABLET EVERY DAY    ferrous sulfate (FEOSOL) 325 mg (65 mg iron) tablet Take one tablet by mouth three times weekly. Take on an empty stomach at least 1 hour before or 2 hours after food.    furosemide (LASIX) 40 mg tablet TAKE ONE TABLET DAILY. MAY TAKE AN ADDITIONAL LASIX AS NEEDED (DOSE ADJUSTMENT)    glucosamine/chondro su A/C/Mn (GLUCOSAMINE-CHONDROITIN COMPLX PO) Take 1 tablet by mouth daily.    hydrALAZINE (APRESOLINE) 50 mg tablet TAKE 1 TABLET TWICE DAILY    levothyroxine (SYNTHROID) 175 mcg tablet Take one tablet by mouth daily 30 minutes before breakfast.    losartan (COZAAR) 100 mg tablet TAKE 1 TABLET EVERY DAY    metFORMIN (GLUCOPHAGE) 500 mg tablet Take one tablet by mouth twice daily with meals. Ok to resume 06/21/18    omeprazole DR(+) (PRILOSEC) 20 mg PO capsule Take one capsule by mouth daily.    rosuvastatin (CRESTOR) 20 mg tablet Take one tablet by mouth daily.    spironolactone (ALDACTONE) 25 mg tablet TAKE 1 TABLET EVERY DAY WITH FOOD    vitamins, multiple tablet Take one tablet by mouth every 48 hours.     Review of Systems:  Pertinent positive findings as noted in history of present illness    Physical Exam:  Vital Signs: Last Filed In 24 Hours Vital Signs: 24 Hour Range   BP: 124/72 (09/23 1249)  Pulse: 64 (09/23 1249)  SpO2: 93 % (09/23 1249)  Height: 180.3 cm (5' 11) (09/23 1249) BP: (124-139)/(64-72)   Pulse:  [64]   SpO2:  [93 %]           Physical Exam  Constitutional:       General: He is not in acute distress.     Appearance: Normal appearance. He is obese. He is not ill-appearing or toxic-appearing.   HENT:      Head: Normocephalic and atraumatic.      Nose: No congestion.      Mouth/Throat:      Pharynx: No oropharyngeal exudate.   Eyes:      General:         Right eye: No discharge.         Left eye: No discharge.      Extraocular Movements: Extraocular movements intact.   Cardiovascular:      Rate and Rhythm: Normal rate and regular rhythm.      Pulses: Normal pulses.      Heart sounds: Murmur (harsh late peaking systolic murmur) heard.   Pulmonary:      Effort: Pulmonary effort is normal. No respiratory distress.      Breath sounds: Normal breath sounds. No wheezing or rales.   Abdominal:      General: Abdomen is flat. There is no distension.      Palpations: Abdomen is soft.      Tenderness: There is no abdominal tenderness.   Musculoskeletal:         General: Swelling (mild b/l LE edema) present. No signs of injury.      Cervical back: Normal range of motion.   Skin:     General: Skin is warm and dry.   Neurological:      Mental Status: He is alert and oriented to person, place, and time. Mental status is at baseline.   Psychiatric:         Mood and Affect: Mood normal.         Behavior: Behavior normal.         Thought Content: Thought content normal.           Lab/Radiology/Other Diagnostic Tests:  24-hour labs:    Results for orders placed or performed during the hospital encounter of 03/26/23 (from the past 24 hour(s))   CBC AND DIFF    Collection Time: 03/26/23  3:15 PM Result Value Ref Range    White Blood Cells 8.8 4.5 - 11.0 K/UL    RBC 4.89 4.4 - 5.5 M/UL    Hemoglobin 15.5 13.5 - 16.5 GM/DL    Hematocrit 16.1 40 - 50 %    MCV 93.6 80 - 100 FL    MCH 31.8 26 - 34 PG    MCHC 33.9 32.0 - 36.0 G/DL    RDW 09.6 11 - 15 %    Platelet Count 211 150 - 400 K/UL    MPV 8.1 7 - 11 FL    Neutrophils 48 41 - 77 %    Lymphocytes 36 24 - 44 %    Monocytes 13 (H) 4 - 12 %    Eosinophils 2 0 - 5 %    Basophils 1 0 - 2 %    Absolute Neutrophil Count 4.20 1.8 - 7.0 K/UL    Absolute Lymph Count 3.15 1.0 - 4.8 K/UL    Absolute Monocyte Count 1.16 (H) 0 - 0.80 K/UL    Absolute Eosinophil Count  0.22 0 - 0.45 K/UL    Absolute Basophil Count 0.11 0 - 0.20 K/UL   PTT (APTT)    Collection Time: 03/26/23  3:15 PM   Result Value Ref Range    APTT 30.2 24.0 - 36.5 SEC   PROTIME INR (PT)    Collection Time: 03/26/23  3:15 PM   Result Value Ref Range    Protime 11.3 10.2 - 12.9 SEC    INR 1.0 0.9 - 1.2   COMPREHENSIVE METABOLIC PANEL    Collection Time: 03/26/23  3:15 PM   Result Value Ref Range    Sodium 140 137 - 147 MMOL/L    Potassium 4.3 3.5 - 5.1 MMOL/L    Chloride 105 98 - 110 MMOL/L    Glucose 155 (H) 70 - 100 MG/DL    Blood Urea Nitrogen 25 7 - 25 MG/DL    Creatinine 4.54 0.4 - 1.24 MG/DL    Calcium 9.9 8.5 - 09.8 MG/DL    Total Protein 6.9 6.0 - 8.0 G/DL    Total Bilirubin 0.4 0.2 - 1.3 MG/DL    Albumin 4.3 3.5 - 5.0 G/DL    Alk Phosphatase 81 25 - 110 U/L    AST (SGOT) 23 7 - 40 U/L    CO2 23 21 - 30 MMOL/L    ALT (SGPT) 28 7 - 56 U/L    Anion Gap 12 3 - 12    eGFR >60 >60 mL/min   MAGNESIUM    Collection Time: 03/26/23  3:15 PM   Result Value Ref Range    Magnesium 1.6 1.6 - 2.6 mg/dL   NT-PRO-BNP    Collection Time: 03/26/23  3:15 PM   Result Value Ref Range    NT-Pro-BNP 349.0 <450 pg/mL   TSH WITH FREE T4 REFLEX    Collection Time: 03/26/23  3:15 PM   Result Value Ref Range    TSH 0.62 0.35 - 5.00 MCU/ML   HIGH SENSITIVITY TROPONIN I 0 HOUR    Collection Time: 03/26/23  3:15 PM   Result Value Ref Range    hs Troponin I 0 Hour 49 (H) <20 ng/L        Pertinent radiology reviewed.

## 2023-03-26 NOTE — Progress Notes
Date of Service: 03/26/2023    Mason Lawson is a 77 y.o. male.       HPI   Mason Lawson is followed for coronary artery diseases, sinus bradycardia, peripheral arterial disease, hyperlipidemia, aortic stenosis, hypertension, heart failure with preserved ejection fraction and paroxysmal atrial fibrillation. Mason Lawson tells me that he has been  having progression of exertional chest discomfort.  It usually comes on in the morning with activity such as walking back and forth to his truck in preparation for painting his house.  Episodes of chest discomfort tend to last for 15 minutes and resolve with rest.  Episodes have been increasing to the point where they occur every other day and the last episode reported by the patient occurred on March 23, 2023 with activity on a hot day.  The chest discomfort is reported as left precordial in location and may be described as a mild pressure sensation.  Mason Lawson congestive symptoms have been very well controlled on his current medical regimen of furosemide 40 mg every other day. He has not had to take supplemental furosemide in recent months.  He stopped taking Jardiance because of the expense.  The patient indicates that he notices tiredness in his legs when he walks uphill, but this is a chronic symptom that has not progressed in recent years.  Otherwise, Mason Lawson indicates that he has been stable and he reports no congestive symptoms, palpitations, sensation of sustained forceful heart pounding, lightheadedness or syncope. The patient reports no myalgias, bleeding abnormalities, or strokelike symptoms.     Historically, Mason Lawson underwent coronary artery bypass surgery in December 2005. At that time his revascularization consisted of an IMA graft to the LAD with a right internal mammary going to the obtuse marginal and a radial to the posterior descending artery. I saw Mason Lawson on 09/23/10 after an abnormal stress test was recently obtained. On 09/23/10 he told me that he had infrequent mild angina which he had noted over the past year. It occurred less than once a month and only when he was walking uphill and only when he was walking shortly after a meal. On 09/17/10 he walked 6 blocks in 10 minutes during a parade and had no angina because he had not eaten recently and was not walking uphill. I reviewed his stress test with him and he wanted to proceed with coronary angiography which was performed on 09/26/10. Intervention was not required. On 09/30/10 carvedilol was added to his medical regimen for BP control and he has tolerated this medication without difficulty. On 10/21/10 HCTZ 25 mg with triamterene 37.5 mg daily was also added to his medical regimen for improved BP control.  The patient says that he had cellulitis in his left lower extremity in September 2015 that resolved with antibiotic therapy. Mason Lawson developed dyspnea with exertion and underwent right and left heart catheterization/coronary angiography on 06/18/2018. Intervention was not required.He reports that he developed Covid illness on July 07 2019 with mild to moderate symptoms.  Mason Lawson reports that he had a chest x-ray and there was no evidence for pneumonia.  He was placed on supplemental oxygen which he has used occasionally since developing Covid illness. He was hospitalized in [place] in April 2021 for hypertension with exacerbation of congestive heart failure.  . He had typical atrial flutter with bradycardic response and was hospitalized in July 2022 and seen by Dr. Bernette Mayers on consult service.  On January 21, 2021 Dr. Wallene Huh did cavotricuspid isthmus ablation.  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 Derrell Lolling implanted a dual-chamber pacemaker. Mason Lawson underwent placement of a Watchman left atrial appendage occlusion device on 02/20/2022.  Follow-up transesophageal echocardiography performed on 04/05/2022 showed a well-seated Watchman device with no evidence of peridevice leak.  His permanent pacemaker then revealed episodes of nonsustained ventricular tachycardia and a stress test was obtained to assess for an ischemic trigger for these episodes.  It was abnormal and coronary angiography with coronary intervention to the right posterior descending artery was performed on 06/22/2022.       Vitals:    03/26/23 1249   BP: 124/72   BP Source: Arm, Left Upper   Pulse: 64   SpO2: 93%   PainSc: Zero   Weight: 100.1 kg (220 lb 9.6 oz)   Height: 180.3 cm (5' 11)     Body mass index is 30.77 kg/m?[name]     Past Medical History  Patient Active Problem List    Diagnosis Date Noted    Acute and chronic respiratory failure with hypoxia (HCC) 12/05/2022    Coronary artery disease due to lipid rich plaque 06/22/2022    Presence of Watchman left atrial appendage closure device 02/21/2022     02/20/2022 - Dr. Milas Kocher; Left atrial appendage occlusion with 31 mm Watchman FLX device      PAF (paroxysmal atrial fibrillation) (HCC) 02/20/2022    Cardiac pacemaker 05/16/2021     01/21/2021 - Successful Dual-chamber Pacemaker Implantation with Dr. Derrell Lolling      Atrial fibrillation [facility]) 05/13/2021    Bradycardia 02/01/2021    DM (diabetes mellitus) type II, controlled, with peripheral vascular disorder (HCC) 02/01/2021    Paroxysmal nocturnal dyspnea 02/01/2021    Atrial flutter (HCC) 01/19/2021     01/21/2021 - AFL RFA with Dr. Wallene Huh:  Successful cavo-tricuspid isthmus ablation.  Comprehensive EP study with coronary sinus catheter placement.  Underlying severe sinus node dysfunction. No escape rhythm.      Chronic heart failure with preserved ejection fraction (HCC) 12/04/2018     RIGHT HEART CATHETERIZATION:  06/2018 in the setting of severe hypertension  HEMODYNAMICS:   Blood pressure 198/81 with a mean of 130 mmHg. Heart rate 50 beats per minute.   PRESSURES:  The right atrial pressure was 4 mmHg. Right ventricular pressure was 64/8 mmHg.  The pulmonary artery pressure was 60/70 mmHg with a mean of 32 mmHg. The pulmonary capillary wedge pressure was 17 mmHg. The transpulmonary gradient was 15 mmHg.  The diastolic pulmonary gradient was 0 mmHg. The pulmonary vascular resistance was 2 Wood units.   RIGHT SATURATIONS: The right atrial saturation 77%. The pulmonary artery saturation was 75%. The aortic saturation was 96%.   OUTPUTS: The cardiac output by Fick method was 5.8 L/minute with a cardiac index of 2.7 L/minute per sq m. The cardiac index by thermodilution was 7.3 with a cardiac index of 3.4 L/minute per sq m.      Nonrheumatic aortic valve stenosis 06/06/2018     07/25/2021 - ECHO:  A septal wall motion abnormality is noted consistent with prior cardiac surgery.  No other regional wall motion abnormalities are noted. Overall left ventricular systolic function appears normal and dynamic. The estimated left ventricular ejection fraction is 65%.  There is mild concentric left ventricular hypertrophy.  Mild right ventricular enlargement with normal right ventricular systolic function. Mild left atrial enlargement.  Moderate aortic valve stenosis.  No pericardial effusion is seen.      Dyslipidemia  10/23/2012    Chest pain 09/26/2010    Abnormal stress test 09/26/2010    Murmur 09/23/2010    Coronary artery disease      A. Onset mid 2004, progression fall 2005, patient reduces activity at work to avoid symptoms  B. 12/05 Progressive angina, Dr. Duanne Limerick, then 06/29/04 CBP Atch OV, referred for cath          >12/29 Cath 100%RCA, 75%LAD&Cx,50%LM EF 55%          >07/01/05 CABGx3 LIMA-LAD w/ Y-RIMA-OM, Rad-PDA per Dr. Ky Barban  C. 06/29/08 exercise echocardiographic images demonstrated basal septal ischemia, significant hypertensive response to exercise, good and adequate exercise tolerance with no angina.  D. LHC 06/2018 for new heart failure symptoms: LEFT HEART CATHETERIZATION:  HEMODYNAMICS:  The aortic pressure was- 195/63 with a mean of 105 mmHg . The left ventricular systolic pressure was 210 mmHg.  There was a peak to peak gradient of 15 mmHg.  Left main coronary artery:  Left main coronary artery arises normally from the left coronary sinus.  The left main coronary artery bifurcates into left anterior descending and left circumflex artery and is free of angiographically significant disease.  Left anterior descending:  Left anterior descending is a large vessel which gives rise to- 3 diagonal vessels. 1 diagonal has a 80% ostial stenosis, the rest of diagonal vessels do not appear to be angiographically significant. Distal LAD has competitive flow from the LIMA.   Left circumflex artery:  The left circumflex artery appears to be medium caliber and has a 100% stenosis proximally. The mid-to-distal portion of the vessel fills from bridging collaterals from proximal portion and is diffusely disease. Three obtuse marginal branches arise, which are also diffusely diseased.   Right coronary artery:  The right coronary artery is a medium caliber vessel, but has 100% chronic total occlusion after the takeoff of conus branch. The PDA fills retrogradely from the radial graft to supply the PLV and has mild plaquing in the native PDA.    BYPASS GRAFT ANATOMY:  1.The LIMA to LAD was visualized using IM catheter.  The ostium, body was noted to be free of any angiographic disease, but has a 80% stenosis at the anastamosis with diffusely diseased distal LAD. There is a Y jump graft from the LIMA to the OM branch which fills retrogradely.            2. The radial artery graft to the PDA appears to be without any angiographically significant disease at ostium, body or anastomotic site.   E. Regadenosom MPI 11/2018 TID Ratio:  1.05  (normal <1.36). Summed Stress Score:  3   , Summed Rest Score:  0 Quantitative polar maps show a statistically insignificant amount of inferoapical ischemia. Mild inferobasilar septal hypokinesis.       Left Ventricular Ejection Fraction (post stress, in the resting state) =  52 %.  Left Ventricular End Diastolic Volume: 478 mL       SUMMARY/OPINION:   This study is borderline abnormal with limited reversible uptake in the inferior wall.  All segments are viable.  Global left ventricular function is within normal limits. In aggregate the current study is low risk in regards to predicted annual cardiovascular mortality rate.         Hypercholesterolemia      A.  02/28/01 total 324 total 249 HDL 43 LDL 231 Pt deferred meds  B. 06/29/04 total 273 trig 193 HDL 36 LDLd206 Zocor begun dc'd by pt d/t statinphobia  and cost  C.  2/24 Rx Vytorin 10/80 1/2 tab.      Hypothyroidism      A.  2004 presentation with TSH over 200, Rx w l-thyroxine      Hypertension      A.  2/06 Altace 5 dc'd w/ dysgeusia, Metoprolol 25 BID begun         Tests ordered      A. 07/17/08 carotid artery duplex scan: no significant stenosis           Review of Systems   Constitutional: Negative.   HENT: Negative.     Eyes: Negative.    Cardiovascular: Negative.    Respiratory: Negative.     Endocrine: Negative.    Hematologic/Lymphatic: Negative.    Skin: Negative.    Musculoskeletal: Negative.    Gastrointestinal: Negative.    Genitourinary: Negative.    Neurological: Negative.    Psychiatric/Behavioral: Negative.     Allergic/Immunologic: Negative.    All other systems reviewed and are negative.      Physical Exam  GENERAL: The patient is well developed, well nourished, resting comfortably and in no distress.    HEENT: No abnormalities of the visible oro-nasopharynx, conjunctiva or sclera are noted.    NECK: There is no jugular venous distension. Carotids are palpable and without bruits. There is no thyroid enlargement.    Chest: Lung fields are clear to auscultation. There are no wheezes or crackles.    CV: There is a regular rhythm. The first and second heart sounds are normal. A grade 3/6 systolic murmur is heard, loudest in the right upper sternal area. There are no murmurs, gallops or rubs.  I checked his apical heart rate and it was 64 bpm.  ABD: The abdomen is soft and supple with normal bowel sounds. There is no hepatosplenomegaly, ascites, tenderness, masses or bruits.    Neuro: There are no focal motor defects. Ambulation is normal. Cognitive function appears normal.    Ext: There is trace bipedal edema without evidence of deep vein thrombosis.  Peripheral pulses are decreased but he has normal capillary refill and no ischemic ulcers.  His recent left femoral artery catheterization site looks excellent without bleeding or hematoma.  SKIN: No rashes or cellulitis.  PSYCH: The patient is calm, rationale and oriented.    Cardiovascular Studies  A twelve-lead ECG obtained on 03/26/2023 reveals an atrial and ventricular paced rhythm with a heart rate of 64 bpm.    Echo Doppler 03/26/2023:  Interpretation Summary  A septal abnormality is noted consistent with biventricular pacing. In addition, the basal inferior septal and basal inferior segments appear hypokinetic. Overall left ventricular systolic function is preserved with an estimated left ventricular ejection fraction in the range of 60-65%.   There is mild concentric left ventricular hypertrophy.  Grade II (moderate) left ventricular diastolic dysfunction. Elevated left atrial pressure.   Mild right ventricular enlargement with normal right ventricular systolic function.  Moderate left atrial enlargement.  Mild right atrial enlargement.  Severe aortic valve stenosis with mild aortic valve regurgitation.  Moderate mitral annular calcification with mild mitral valve regurgitation.  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 The proximal ascending aorta measures approximately 3.6 cm in diameter which is within normal in size.   No pericardial effusion is seen.   On the prior echo Doppler study obtained on 07/25/2021, the peak transaortic velocity on the prior study was 3.4 m/s yielding a  mean aortic valve gradient of 23 mmHg.  The estimated aortic valve area was 1.6 cm? on the prior study.  On the prior echo Doppler study, the aorta measured 3.6 cm in diameter at the level of the sinus of Valsalva and at the level of the proximal ascending aorta.  There has been interval progression of aortic valve stenosis.    Cardiovascular Health Factors  Vitals BP Readings from Last 3 Encounters:   03/26/23 124/72   03/26/23 139/64   12/05/22 136/70     Wt Readings from Last 3 Encounters:   03/26/23 100.1 kg (220 lb 9.6 oz)   03/26/23 98 kg (216 lb)   12/05/22 100.5 kg (221 lb 9.6 oz)     BMI Readings from Last 3 Encounters:   03/26/23 30.77 kg/m?   03/26/23 30.14 kg/m?   12/05/22 30.91 kg/m?      Smoking Social History     Tobacco Use   Smoking Status Former    Current packs/day: 0.00    Average packs/day: 1 pack/day for 40.0 years (40.0 ttl pk-yrs)    Types: Cigarettes    Start date: 06/02/1964    Quit date: 06/02/2004    Years since quitting: 18.8   Smokeless Tobacco Never      Lipid Profile Cholesterol   Date Value Ref Range Status   11/01/2022 72  Final     HDL   Date Value Ref Range Status   11/01/2022 41  Final     LDL   Date Value Ref Range Status   11/01/2022 13  Final     Triglycerides   Date Value Ref Range Status   11/01/2022 93  Final      Blood Sugar Hemoglobin A1C   Date Value Ref Range Status   04/12/2021 6.8 (H) <5.6 Final     Glucose   Date Value Ref Range Status   11/01/2022 119 (H) 70 - 105 Final   06/23/2022 109 (H) 70 - 100 MG/DL Final   29/56/2130 865 (H) 70 - 105 Final   07/04/2004 121 (H) 70 - 110 MG/DL Final   78/46/9629 528 (H) 70 - 110 MG/DL Final   41/32/4401 027 (H) 70 - 110 MG/DL Final     Glucose, POC   Date Value Ref Range Status   06/22/2022 111 (H) 70 - 100 MG/DL Final   25/36/6440 347 (H) 70 - 100 MG/DL Final   42/59/5638 756 (H) 70 - 100 MG/DL Final          Problems Addressed Today  Encounter Diagnoses   Name Primary?    Chronic heart failure with preserved ejection fraction (HCC) Yes    Screening for heart disease        Assessment and Plan   I am concerned that Mr. Zink is having progression of exertional chest discomfort. Diagnostic and therapeutic alternatives were presented to the patient and he wanted to proceed with coronary angiography and revascularization of amenable coronary lesions.  I have recommended hospitalization today for more immediate evaluation and treatment.  The patient also has severe aortic valve stenosis and will need to be considered either for surgical or transcatheter aortic valve replacement.  I also recommend obtaining a follow-up CT chest for further evaluation of pulmonary nodules described on the prior CT exam obtained on 02/09/2022. The total time spent during this interview and exam with preparation and chart review was 60 minutes.         Current Medications (including today's revisions)  amLODIPine (NORVASC) 5 mg tablet TAKE 1 TABLET TWICE DAILY    aspirin EC (ASPIR-LOW) 81 mg tablet Take one tablet by mouth daily. Indications: s/p Watchman    calcium carbonate (TUMS) 500 mg (200 mg elemental calcium) chewable tablet Chew one tablet by mouth daily as needed.    CHOLEcalciferoL (vitamin D3) (VITAMIN D3) 50 mcg (2,000 unit) tablet Take one tablet by mouth every 48 hours.    coenzyme Q10 100 mg cap Take one capsule by mouth three times weekly.    CRANBERRY FRUIT EXTRACT (CRANBERRY PO) Take 4,200 mg by mouth daily.    cyanocobalamin (vitamin B-12) 500 mcg tablet Take one tablet by mouth every 48 hours.    evolocumab (REPATHA SURECLICK) 140 mg/mL injectable PEN Inject 1 mL under the skin every 14 days.    ezetimibe (ZETIA) 10 mg tablet TAKE 1 TABLET EVERY DAY    ferrous sulfate (FEOSOL) 325 mg (65 mg iron) tablet Take one tablet by mouth three times weekly. Take on an empty stomach at least 1 hour before or 2 hours after food.    furosemide (LASIX) 40 mg tablet TAKE ONE TABLET DAILY. MAY TAKE AN ADDITIONAL LASIX AS NEEDED (DOSE ADJUSTMENT) glucosamine/chondro su A/C/Mn (GLUCOSAMINE-CHONDROITIN COMPLX PO) Take 1 tablet by mouth daily.    hydrALAZINE (APRESOLINE) 50 mg tablet TAKE 1 TABLET TWICE DAILY    levothyroxine (SYNTHROID) 175 mcg tablet Take one tablet by mouth daily 30 minutes before breakfast.    losartan (COZAAR) 100 mg tablet TAKE 1 TABLET EVERY DAY    metFORMIN (GLUCOPHAGE) 500 mg tablet Take one tablet by mouth twice daily with meals. Ok to resume 06/21/18    omeprazole DR(+) (PRILOSEC) 20 mg PO capsule Take one capsule by mouth daily.    rosuvastatin (CRESTOR) 20 mg tablet Take one tablet by mouth daily.    spironolactone (ALDACTONE) 25 mg tablet TAKE 1 TABLET EVERY DAY WITH FOOD    vitamins, multiple tablet Take one tablet by mouth every 48 hours.

## 2023-03-27 ENCOUNTER — Inpatient Hospital Stay: Admit: 2023-03-27 | Discharge: 2023-03-27 | Payer: MEDICARE

## 2023-03-27 ENCOUNTER — Encounter: Admit: 2023-03-27 | Discharge: 2023-03-27 | Payer: MEDICARE

## 2023-03-27 LAB — HIGH SENSITIVITY TROPONIN I 4 HR
HI SEN TNI 4 HR: 57 ng/L — ABNORMAL HIGH (ref <20)
HI SEN TNI DELTA 4-2: 3 g/dL (ref 6.0–8.0)

## 2023-03-27 LAB — POC GLUCOSE: POC GLUCOSE: 116 mg/dL — ABNORMAL HIGH (ref 70–100)

## 2023-03-27 MED ADMIN — SODIUM CHLORIDE 0.9 % IV SOLP [27838]: 250 mL | INTRAVENOUS | @ 18:00:00 | Stop: 2023-03-27 | NDC 00338004904

## 2023-03-27 MED ADMIN — METOPROLOL SUCCINATE 25 MG PO TB24 [81866]: 25 mg | ORAL | @ 20:00:00 | NDC 00904632261

## 2023-03-27 MED ADMIN — MAGNESIUM SULFATE IN D5W 1 GRAM/100 ML IV PGBK [166578]: 1 g | INTRAVENOUS | @ 14:00:00 | Stop: 2023-03-27 | NDC 00264440054

## 2023-03-28 ENCOUNTER — Inpatient Hospital Stay: Admit: 2023-03-28 | Discharge: 2023-03-28 | Payer: MEDICARE

## 2023-03-28 MED ADMIN — FLU VACC TS2024-25(65YR UP)-PF 180 MCG/0.5 ML IM SYRG [467263]: 0.5 mL | INTRAMUSCULAR | @ 20:00:00 | Stop: 2023-03-28 | NDC 49281012488

## 2023-03-28 MED ADMIN — HEPARIN, PORCINE (PF) 5,000 UNIT/0.5 ML IJ SYRG [95535]: 5000 [IU] | SUBCUTANEOUS | @ 01:00:00 | NDC 00409131611

## 2023-03-28 MED ADMIN — HEPARIN, PORCINE (PF) 5,000 UNIT/0.5 ML IJ SYRG [95535]: 5000 [IU] | SUBCUTANEOUS | @ 14:00:00 | Stop: 2023-03-28 | NDC 00409131611

## 2023-03-31 ENCOUNTER — Encounter: Admit: 2023-03-31 | Discharge: 2023-03-31 | Payer: MEDICARE

## 2023-04-03 ENCOUNTER — Encounter: Admit: 2023-04-03 | Discharge: 2023-04-03 | Payer: MEDICARE

## 2023-04-04 DIAGNOSIS — I350 Nonrheumatic aortic (valve) stenosis: Secondary | ICD-10-CM

## 2023-04-04 MED ORDER — EZETIMIBE 10 MG PO TAB
ORAL_TABLET | 3 refills | Status: AC
Start: 2023-04-04 — End: ?

## 2023-04-05 ENCOUNTER — Encounter: Admit: 2023-04-05 | Discharge: 2023-04-05 | Payer: MEDICARE

## 2023-04-06 ENCOUNTER — Encounter: Admit: 2023-04-06 | Discharge: 2023-04-06 | Payer: MEDICARE

## 2023-04-08 ENCOUNTER — Encounter: Admit: 2023-04-08 | Discharge: 2023-04-08 | Payer: MEDICARE

## 2023-04-09 MED FILL — REPATHA SURECLICK 140 MG/ML SC PNIJ: 140 mg/mL | SUBCUTANEOUS | 28 days supply | Qty: 2 | Fill #11 | Status: AC

## 2023-04-12 ENCOUNTER — Encounter: Admit: 2023-04-12 | Discharge: 2023-04-12 | Payer: MEDICARE

## 2023-04-12 NOTE — Telephone Encounter
Called patient to discuss upcoming appointment with Kathryne Sharper, ARNP. Patient stated that he was unsure of why he was seeing this provider as he has been doing okay since his hospitalization in September and does not feel that this appointment is necessary. Reviewed with provider, Kathryne Sharper, ARNP and will cancel appointment on 10/11. Patient advised to reach out if he experiences any heart issues and he verbalized understanding.

## 2023-04-13 ENCOUNTER — Encounter: Admit: 2023-04-13 | Discharge: 2023-04-13 | Payer: MEDICARE

## 2023-04-13 ENCOUNTER — Ambulatory Visit: Admit: 2023-04-13 | Discharge: 2023-04-14 | Payer: MEDICARE

## 2023-04-13 DIAGNOSIS — IMO0001 Tests ordered: Secondary | ICD-10-CM

## 2023-04-13 DIAGNOSIS — Z95818 Presence of other cardiac implants and grafts: Secondary | ICD-10-CM

## 2023-04-13 DIAGNOSIS — E7800 Pure hypercholesterolemia, unspecified: Secondary | ICD-10-CM

## 2023-04-13 DIAGNOSIS — I10 Essential (primary) hypertension: Secondary | ICD-10-CM

## 2023-04-13 DIAGNOSIS — Z950 Presence of cardiac pacemaker: Secondary | ICD-10-CM

## 2023-04-13 DIAGNOSIS — E039 Hypothyroidism, unspecified: Secondary | ICD-10-CM

## 2023-04-13 DIAGNOSIS — I6523 Occlusion and stenosis of bilateral carotid arteries: Secondary | ICD-10-CM

## 2023-04-13 DIAGNOSIS — I4891 Unspecified atrial fibrillation: Secondary | ICD-10-CM

## 2023-04-13 DIAGNOSIS — R9439 Abnormal result of other cardiovascular function study: Secondary | ICD-10-CM

## 2023-04-13 DIAGNOSIS — R079 Chest pain, unspecified: Secondary | ICD-10-CM

## 2023-04-13 DIAGNOSIS — J449 Chronic obstructive pulmonary disease, unspecified: Secondary | ICD-10-CM

## 2023-04-13 DIAGNOSIS — I2510 Atherosclerotic heart disease of native coronary artery without angina pectoris: Secondary | ICD-10-CM

## 2023-04-14 DIAGNOSIS — I6529 Occlusion and stenosis of unspecified carotid artery: Secondary | ICD-10-CM

## 2023-04-20 ENCOUNTER — Encounter: Admit: 2023-04-20 | Discharge: 2023-04-20 | Payer: MEDICARE

## 2023-04-20 NOTE — Telephone Encounter
CT orders faxed this morning with prior auth.

## 2023-04-20 NOTE — Telephone Encounter
Who is calling? [name]    Is the patient new or return? Return - Who is the primary Vascular provider? Dr. Verlee Rossetti    What is the reason for the call? Patient states he hasn't heard anything regarding scheduling his CT at [facility]. He states he called them and they advised they need a pre authorization from [employer] before they can schedule.    Are there any other needs/concerns the patient has?  yes   If yes, description of concern:  Patient is wanting to complete this before the end of the year due to insurance being dropped by 07/03/2023.    Patient was notified that if call was received after 3:30 pm the call may not be returned until the next business day. Yes    Who is being called back? Patient- Mason Lawson  Call Back phone number: [phone]

## 2023-04-20 NOTE — Telephone Encounter
Contacted patient to inform scan was authorized and faxed to [facility] this AM.

## 2023-04-24 ENCOUNTER — Encounter: Admit: 2023-04-24 | Discharge: 2023-04-24 | Payer: MEDICARE

## 2023-04-25 ENCOUNTER — Encounter: Admit: 2023-04-25 | Discharge: 2023-04-25 | Payer: MEDICARE

## 2023-04-27 ENCOUNTER — Encounter: Admit: 2023-04-27 | Discharge: 2023-04-27 | Payer: MEDICARE

## 2023-04-30 ENCOUNTER — Encounter: Admit: 2023-04-30 | Discharge: 2023-04-30 | Payer: MEDICARE

## 2023-04-30 MED ORDER — AMLODIPINE 5 MG PO TAB
5 mg | ORAL_TABLET | Freq: Two times a day (BID) | 3 refills | Status: AC
Start: 2023-04-30 — End: ?

## 2023-05-01 ENCOUNTER — Encounter: Admit: 2023-05-01 | Discharge: 2023-05-01 | Payer: MEDICARE

## 2023-05-02 ENCOUNTER — Encounter: Admit: 2023-05-02 | Discharge: 2023-05-02 | Payer: MEDICARE

## 2023-05-03 ENCOUNTER — Encounter: Admit: 2023-05-03 | Discharge: 2023-05-03 | Payer: MEDICARE

## 2023-05-03 MED ORDER — HYDRALAZINE 50 MG PO TAB
ORAL_TABLET | ORAL | 3 refills | 42.50000 days | Status: AC
Start: 2023-05-03 — End: ?

## 2023-05-04 ENCOUNTER — Encounter: Admit: 2023-05-04 | Discharge: 2023-05-04 | Payer: MEDICARE

## 2023-05-04 DIAGNOSIS — Z953 Presence of xenogenic heart valve: Secondary | ICD-10-CM

## 2023-05-04 DIAGNOSIS — I352 Nonrheumatic aortic (valve) stenosis with insufficiency: Secondary | ICD-10-CM

## 2023-05-04 DIAGNOSIS — I350 Nonrheumatic aortic (valve) stenosis: Secondary | ICD-10-CM

## 2023-05-04 DIAGNOSIS — I2510 Atherosclerotic heart disease of native coronary artery without angina pectoris: Secondary | ICD-10-CM

## 2023-05-04 NOTE — Telephone Encounter
Spoke with patient regarding results/recommendations of CTA neck. Per Dr. Maye Hides, recommend Right carotid endarterectomy under general anesthesia, however, would have TAVR completed first. Informed CTS care coordinators. Will follow up on scheduling CEA once aortic stenosis is addressed. Patient verbalized understanding.

## 2023-05-04 NOTE — Telephone Encounter
Patient discussed with Dr. Ky Barban. He stated that if vascular gives go ahead, then can schedule TAVR. 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 Spoke to patient to schedule preop appointments & TAVR. Valve clinic nurses' direct phone number [phone]) provided for any questions.

## 2023-05-04 NOTE — Progress Notes
I reviewed the CTA of the head and neck. Given that he has severe aortic stenosis and recently was symptomatic from this with an inpatient admission and work-up would recommend proceeding with his valve repair by the CTS team. He is asymptomatic from his carotid disease and this can be addressed after he has recovered. Will call the patient to discuss.

## 2023-05-07 ENCOUNTER — Encounter: Admit: 2023-05-07 | Discharge: 2023-05-07 | Payer: MEDICARE

## 2023-05-09 ENCOUNTER — Encounter: Admit: 2023-05-09 | Discharge: 2023-05-09 | Payer: MEDICARE

## 2023-05-09 MED FILL — REPATHA SURECLICK 140 MG/ML SC PNIJ: 140 mg/mL | SUBCUTANEOUS | 28 days supply | Qty: 2 | Fill #12 | Status: AC

## 2023-05-16 ENCOUNTER — Encounter: Admit: 2023-05-16 | Discharge: 2023-05-16 | Payer: MEDICARE

## 2023-05-16 DIAGNOSIS — Z950 Presence of cardiac pacemaker: Secondary | ICD-10-CM

## 2023-05-21 ENCOUNTER — Encounter: Admit: 2023-05-21 | Discharge: 2023-05-21 | Payer: MEDICARE

## 2023-05-22 ENCOUNTER — Encounter: Admit: 2023-05-22 | Discharge: 2023-05-22 | Payer: MEDICARE

## 2023-05-22 ENCOUNTER — Ambulatory Visit: Admit: 2023-05-22 | Discharge: 2023-05-22 | Payer: MEDICARE

## 2023-05-22 DIAGNOSIS — I350 Nonrheumatic aortic (valve) stenosis: Secondary | ICD-10-CM

## 2023-05-22 DIAGNOSIS — Z950 Presence of cardiac pacemaker: Secondary | ICD-10-CM

## 2023-05-22 NOTE — Progress Notes
Date of Service: 05/22/2023    Mason Lawson is a 77 y.o. male.       HPI     Mason Lawson is followed for coronary artery diseases, sinus bradycardia, peripheral arterial disease, hyperlipidemia, aortic stenosis, hypertension, heart failure with preserved ejection fraction and paroxysmal atrial fibrillation.  He has had progression of aortic valve stenosis and is scheduled for transcatheter aortic valve replacement on June 04, 2023.  He reports no angina since the weather has been cooler.  Mason Lawson's congestive symptoms have been very well controlled on his current medical regimen of furosemide 40 mg alternating with 20 mg on a daily basis.  He has not had to take supplemental furosemide in recent months.  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  He reports that his symptoms have only mildly progressed over 5 years and reports no sores on his toes or other vascular complications.  Otherwise, Mason Lawson indicates that he has been stable and he reports no congestive symptoms, palpitations, sensation of sustained forceful heart pounding, lightheadedness or syncope. The patient reports no myalgias, bleeding abnormalities, or strokelike symptoms.     Historically, Mason Lawson underwent coronary artery bypass surgery in December 2005. At that time his revascularization consisted of an IMA graft to the LAD with a right internal mammary going to the obtuse marginal and a radial to the posterior descending artery. I saw Mason Lawson on 09/23/10 after an abnormal stress test was recently obtained. On 09/23/10 he told me that he had infrequent mild angina which he had noted over the past year. It occurred less than once a month and only when he was walking uphill and only when he was walking shortly after a meal. On 09/17/10 he walked 6 blocks in 10 minutes during a parade and had no angina because he had not eaten recently and was not walking uphill. I reviewed his stress test with him and he wanted to proceed with coronary angiography which was performed on 09/26/10. Intervention was not required. On 09/30/10 carvedilol was added to his medical regimen for BP control and he has tolerated this medication without difficulty. On 10/21/10 HCTZ 25 mg with triamterene 37.5 mg daily was also added to his medical regimen for improved BP control.  The patient says that he had cellulitis in his left lower extremity in September 2015 that resolved with antibiotic therapy. Mason Lawson developed dyspnea with exertion and underwent right and left heart catheterization/coronary angiography on 06/18/2018. Intervention was not required.He reports that he developed Covid illness on July 07 2019 with mild to moderate symptoms.  Mason Lawson reports that he had a chest x-ray and there was no evidence for pneumonia.  He was placed on supplemental oxygen which he has used occasionally since developing Covid illness. He was hospitalized in [place] in April 2021 for hypertension with exacerbation of congestive heart failure.  . He had typical atrial flutter with bradycardic response and was hospitalized in July 2022 and seen by Dr. Bernette Mayers on consult service.  On January 21, 2021 Dr. Wallene Huh did cavotricuspid isthmus ablation.  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 Derrell Lolling implanted a dual-chamber pacemaker. Mason Lawson underwent placement of a Watchman left atrial appendage occlusion device on 02/20/2022.  Follow-up transesophageal echocardiography performed on 04/05/2022 showed a well-seated Watchman device with no evidence of peridevice leak.  His permanent pacemaker then revealed  episodes of nonsustained ventricular tachycardia and a stress test was obtained to assess for an ischemic trigger for these episodes.  It was abnormal and coronary angiography with coronary intervention to the right posterior descending artery was performed on 06/22/2022.       Vitals:    05/22/23 1025   BP: 131/74   BP Source: Arm, Left Upper   Pulse: 61   SpO2: 95%   O2 Device: None (Room air)   PainSc: Zero   Weight: 99.7 kg (219 lb 12.8 oz)   Height: 181.6 cm (5' 11.5)     Body mass index is 30.23 kg/m?Mason Lawson     Past Medical History  Patient Active Problem List    Diagnosis Date Noted    Aortic valve disease 03/26/2023    Acute and chronic respiratory failure with hypoxia (HCC) 12/05/2022    Coronary artery disease due to lipid rich plaque 06/22/2022    Presence of Watchman left atrial appendage closure device 02/21/2022     02/20/2022 - Dr. Milas Kocher; Left atrial appendage occlusion with 31 mm Watchman FLX device      PAF (paroxysmal atrial fibrillation) (HCC) 02/20/2022    Cardiac pacemaker 05/16/2021     01/21/2021 - Successful Dual-chamber Pacemaker Implantation with Dr. Derrell Lolling      Atrial fibrillation [facility]) 05/13/2021    Bradycardia 02/01/2021    DM (diabetes mellitus) type II, controlled, with peripheral vascular disorder (HCC) 02/01/2021    Paroxysmal nocturnal dyspnea 02/01/2021    Atrial flutter (HCC) 01/19/2021     01/21/2021 - AFL RFA with Dr. Wallene Huh:  Successful cavo-tricuspid isthmus ablation.  Comprehensive EP study with coronary sinus catheter placement.  Underlying severe sinus node dysfunction. No escape rhythm.      Chronic heart failure with preserved ejection fraction (HCC) 12/04/2018     RIGHT HEART CATHETERIZATION:  06/2018 in the setting of severe hypertension  HEMODYNAMICS:   Blood pressure 198/81 with a mean of 130 mmHg. Heart rate 50 beats per minute.   PRESSURES:  The right atrial pressure was 4 mmHg. Right ventricular pressure was 64/8 mmHg.  The pulmonary artery pressure was 60/70 mmHg with a mean of 32 mmHg. The pulmonary capillary wedge pressure was 17 mmHg. The transpulmonary gradient was 15 mmHg.  The diastolic pulmonary gradient was 0 mmHg. The pulmonary vascular resistance was 2 Wood units.   RIGHT SATURATIONS: The right atrial saturation 77%. The pulmonary artery saturation was 75%. The aortic saturation was 96%.   OUTPUTS: The cardiac output by Fick method was 5.8 L/minute with a cardiac index of 2.7 L/minute per sq m. The cardiac index by thermodilution was 7.3 with a cardiac index of 3.4 L/minute per sq m.      Nonrheumatic aortic valve stenosis 06/06/2018     07/25/2021 - ECHO:  A septal wall motion abnormality is noted consistent with prior cardiac surgery.  No other regional wall motion abnormalities are noted. Overall left ventricular systolic function appears normal and dynamic. The estimated left ventricular ejection fraction is 65%.  There is mild concentric left ventricular hypertrophy.  Mild right ventricular enlargement with normal right ventricular systolic function. Mild left atrial enlargement.  Moderate aortic valve stenosis.  No pericardial effusion is seen.      Dyslipidemia 10/23/2012    Chest pain 09/26/2010    Abnormal stress test 09/26/2010    Murmur 09/23/2010    Coronary artery disease      A. Onset mid 2004, progression fall 2005, patient reduces activity at work  to avoid symptoms  B. 12/05 Progressive angina, Dr. Duanne Limerick, then 06/29/04 CBP Atch OV, referred for cath          >12/29 Cath 100%RCA, 75%LAD&Cx,50%LM EF 55%          >07/01/05 CABGx3 LIMA-LAD w/ Y-RIMA-OM, Rad-PDA per Dr. Ky Barban  C. 06/29/08 exercise echocardiographic images demonstrated basal septal ischemia, significant hypertensive response to exercise, good and adequate exercise tolerance with no angina.  D. LHC 06/2018 for new heart failure symptoms: LEFT HEART CATHETERIZATION:  HEMODYNAMICS:  The aortic pressure was- 195/63 with a mean of 105 mmHg .  The left ventricular systolic pressure was 210 mmHg.  There was a peak to peak gradient of 15 mmHg.  Left main coronary artery:  Left main coronary artery arises normally from the left coronary sinus.  The left main coronary artery bifurcates into left anterior descending and left circumflex artery and is free of angiographically significant disease.  Left anterior descending:  Left anterior descending is a large vessel which gives rise to- 3 diagonal vessels. 1 diagonal has a 80% ostial stenosis, the rest of diagonal vessels do not appear to be angiographically significant. Distal LAD has competitive flow from the LIMA.   Left circumflex artery:  The left circumflex artery appears to be medium caliber and has a 100% stenosis proximally. The mid-to-distal portion of the vessel fills from bridging collaterals from proximal portion and is diffusely disease. Three obtuse marginal branches arise, which are also diffusely diseased.   Right coronary artery:  The right coronary artery is a medium caliber vessel, but has 100% chronic total occlusion after the takeoff of conus branch. The PDA fills retrogradely from the radial graft to supply the PLV and has mild plaquing in the native PDA.    BYPASS GRAFT ANATOMY:  1.The LIMA to LAD was visualized using IM catheter.  The ostium, body was noted to be free of any angiographic disease, but has a 80% stenosis at the anastamosis with diffusely diseased distal LAD. There is a Y jump graft from the LIMA to the OM branch which fills retrogradely.            2. The radial artery graft to the PDA appears to be without any angiographically significant disease at ostium, body or anastomotic site.   E. Regadenosom MPI 11/2018 TID Ratio:  1.05  (normal <1.36). Summed Stress Score:  3   , Summed Rest Score:  0 Quantitative polar maps show a statistically insignificant amount of inferoapical ischemia. Mild inferobasilar septal hypokinesis.       Left Ventricular Ejection Fraction (post stress, in the resting state) =  52 %.  Left Ventricular End Diastolic Volume: 161 mL       SUMMARY/OPINION:   This study is borderline abnormal with limited reversible uptake in the inferior wall.  All segments are viable.  Global left ventricular function is within normal limits. In aggregate the current study is low risk in regards to predicted annual cardiovascular mortality rate.         Hypercholesterolemia      A.  02/28/01 total 324 total 249 HDL 43 LDL 231 Pt deferred meds  B. 06/29/04 total 273 trig 193 HDL 36 LDLd206 Zocor begun dc'd by pt d/t statinphobia and cost  C.  2/24 Rx Vytorin 10/80 1/2 tab.      Hypothyroidism      A.  2004 presentation with TSH over 200, Rx w l-thyroxine      Hypertension  A.  2/06 Altace 5 dc'd w/ dysgeusia, Metoprolol 25 BID begun         Tests ordered      A. 07/17/08 carotid artery duplex scan: no significant stenosis           Review of Systems   Constitutional: Negative.   HENT: Negative.     Eyes: Negative.    Cardiovascular: Negative.    Respiratory: Negative.     Endocrine: Negative.    Hematologic/Lymphatic: Negative.    Skin: Negative.    Musculoskeletal: Negative.    Gastrointestinal: Negative.    Genitourinary: Negative.    Neurological: Negative.    Psychiatric/Behavioral: Negative.     Allergic/Immunologic: Negative.        Physical Exam  GENERAL: The patient is well developed, well nourished, resting comfortably and in no distress.    HEENT: No abnormalities of the visible oro-nasopharynx, conjunctiva or sclera are noted.    NECK: There is no jugular venous distension. Carotids are palpable and without bruits. There is no thyroid enlargement.    Chest: Lung fields are clear to auscultation. There are no wheezes or crackles.    CV: There is a regular rhythm. The first and second heart sounds are normal. A grade 3/6 systolic murmur is heard, loudest in the right upper sternal area. There are no murmurs, gallops or rubs.  I checked his apical heart rate and it was 60 bpm.  ABD: The abdomen is soft and supple with normal bowel sounds. There is no hepatosplenomegaly, ascites, tenderness, masses or bruits.    Neuro: There are no focal motor defects. Ambulation is normal. Cognitive function appears normal.    Ext: There is trace bipedal edema without evidence of deep vein thrombosis.  Peripheral pulses are decreased but he has normal capillary refill and no ischemic ulcers.  His recent left femoral artery catheterization site looks excellent without bleeding or hematoma.  SKIN: No rashes or cellulitis.  PSYCH: The patient is calm, rationale and oriented.    Cardiovascular Studies  A twelve-lead ECG obtained on March 28, 2023 shows atrial ventricular pacing with a heart rate of 61 bpm.  Echo Doppler 03/26/2023:  Interpretation Summary  A septal abnormality is noted consistent with ventricular pacing. In addition, the basal inferior septal and basal inferior segments appear hypokinetic. Overall left ventricular systolic function is preserved with an estimated left ventricular ejection fraction in the range of 60-65%.   There is mild concentric left ventricular hypertrophy.  Grade II (moderate) left ventricular diastolic dysfunction. Elevated left atrial pressure.   Mild right ventricular enlargement with normal right ventricular systolic function.  Moderate left atrial enlargement.  Mild right atrial enlargement.  Severe aortic valve stenosis with mild aortic valve regurgitation.  Moderate mitral annular calcification with mild mitral valve regurgitation.  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 The proximal ascending aorta measures approximately 3.6 cm in diameter which is within normal in size.   No pericardial effusion is seen.     On the prior echo Doppler study obtained on 07/25/2021, the peak transaortic velocity on the prior study was 3.4 m/s yielding a mean aortic valve gradient of 23 mmHg.  The estimated aortic valve area was 1.6 cm? on the prior study.  On the prior echo Doppler study, the aorta measured 3.6 cm in diameter at the level of the sinus of Valsalva and at the level of the proximal ascending aorta.  There  has been interval progression of aortic valve stenosis.  Cardiovascular Health Factors  Vitals BP Readings from Last 3 Encounters:   05/22/23 131/74   04/13/23 121/58   03/28/23 109/56     Wt Readings from Last 3 Encounters:   05/22/23 99.7 kg (219 lb 12.8 oz)   04/13/23 100.6 kg (221 lb 12.8 oz)   03/28/23 96.5 kg (212 lb 12.8 oz)     BMI Readings from Last 3 Encounters:   05/22/23 30.23 kg/m?   04/13/23 30.93 kg/m?   03/28/23 29.69 kg/m?      Smoking Social History     Tobacco Use   Smoking Status Former    Current packs/day: 0.00    Average packs/day: 1 pack/day for 40.0 years (40.0 ttl pk-yrs)    Types: Cigarettes    Start date: 06/02/1964    Quit date: 06/02/2004    Years since quitting: 18.9   Smokeless Tobacco Never      Lipid Profile Cholesterol   Date Value Ref Range Status   03/27/2023 71 <200 MG/DL Final     HDL   Date Value Ref Range Status   03/27/2023 42 >40 MG/DL Final     LDL   Date Value Ref Range Status   03/27/2023 21 <100 mg/dL Final     Triglycerides   Date Value Ref Range Status   03/27/2023 124 <150 MG/DL Final      Blood Sugar Hemoglobin A1C   Date Value Ref Range Status   03/26/2023 6.7 (H) 4.0 - 5.7 % Final     Comment:     The ADA recommends that most patients with type 1 and type 2 diabetes maintain   an A1c level <7%.       Glucose   Date Value Ref Range Status   03/28/2023 110 (H) 70 - 100 MG/DL Final   08/65/7846 962 (H) 70 - 100 MG/DL Final   95/28/4132 440 (H) 70 - 100 MG/DL Final     Glucose, POC   Date Value Ref Range Status   03/28/2023 100 70 - 100 MG/DL Final   04/29/2535 644 (H) 70 - 100 MG/DL Final   03/47/4259 563 (H) 70 - 100 MG/DL Final          Problems Addressed Today  Aortic valve stenosis.  Coronary disease.  Peripheral chill disease.    Assessment and Plan     Mr. Sussman has symptomatic severe aortic valve stenosis with normal ejection fraction.  He it appears that he is scheduled for transcatheter aortic valve replacement on June 04, 2023.  I reviewed his recent testing with him.  He is symptomatic from claudication in both calves but the claudication appears to be only mildly progressive over the past 5 years.  This can be addressed after his transcatheter aortic valve replacement.  He was reminded to be compliant with his current medications including Repatha.  I have asked him to return for follow-up in 6 months time to follow his progress. The total time spent during this interview and exam with preparation and chart review was 30 minutes.           Current Medications (including today's revisions)   amLODIPine (NORVASC) 5 mg tablet TAKE 1 TABLET TWICE DAILY    aspirin EC (ASPIR-LOW) 81 mg tablet Take one tablet by mouth daily. Indications: s/p Watchman    bisoprolol fumarate (ZEBETA) 5 mg tablet Take one-half tablet by mouth daily. Indications: myocardial reinfarction prevention    calcium  carbonate (TUMS) 500 mg (200 mg elemental calcium) chewable tablet Chew one tablet by mouth daily as needed.    CHOLEcalciferoL (vitamin D3) (VITAMIN D3) 50 mcg (2,000 unit) tablet Take one tablet by mouth every 48 hours.    coenzyme Q10 100 mg cap Take one capsule by mouth three times weekly.    Cranberry 500 mg cap Take one capsule by mouth daily.    cyanocobalamin (vitamin B-12) 500 mcg tablet Take two tablets by mouth every 48 hours.    evolocumab (REPATHA SURECLICK) 140 mg/mL injectable PEN Inject 1 mL under the skin every 14 days.    ezetimibe (ZETIA) 10 mg tablet TAKE 1 TABLET EVERY DAY    ferrous sulfate (FEOSOL) 325 mg (65 mg iron) tablet Take one tablet by mouth three times weekly. Take on an empty stomach at least 1 hour before or 2 hours after food.    furosemide (LASIX) 40 mg tablet TAKE ONE TABLET DAILY. MAY TAKE AN ADDITIONAL LASIX AS NEEDED (DOSE ADJUSTMENT) (Patient taking differently: Take one tablet by mouth daily. Pt takes 40 mg one day and 20 mg the next (alternating dosages daily))    glucosamine/chondro su A/C/Mn (GLUCOSAMINE-CHONDROITIN COMPLX PO) Take 1 tablet by mouth daily.    hydrALAZINE (APRESOLINE) 50 mg tablet TAKE 1 TABLET TWICE DAILY    levothyroxine (SYNTHROID) 175 mcg tablet Take one tablet by mouth daily 30 minutes before breakfast.    losartan (COZAAR) 100 mg tablet TAKE 1 TABLET EVERY DAY    Magnesium 250 mg tab Take one tablet by mouth every 48 hours.    metFORMIN (GLUCOPHAGE) 500 mg tablet Take one tablet by mouth twice daily with meals. Ok to resume 03/30/2023  Indications: type 2 diabetes mellitus    omeprazole DR(+) (PRILOSEC) 20 mg PO capsule Take one capsule by mouth daily.    rosuvastatin (CRESTOR) 20 mg tablet Take one tablet by mouth daily.    spironolactone (ALDACTONE) 25 mg tablet TAKE 1 TABLET EVERY DAY WITH FOOD    vitamins, multiple tablet Take one tablet by mouth every 48 hours.

## 2023-05-27 ENCOUNTER — Encounter: Admit: 2023-05-27 | Discharge: 2023-05-27 | Payer: MEDICARE

## 2023-05-29 NOTE — Discharge Instructions - Pharmacy
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 The proximal ascending aorta measures approximately 3.6 cm in diameter which is within normal in size. Carotids: RICA >70% stenosis.     Brief Hospital Course:  The patient was admitted to [facility] for elective transcatheter aortic valve replacement on 12/2 with Dr. Ky Barban and Dr. Argie Ramming.  The patient underwent the procedure and tolerated it well.  They were monitored on the cardiothoracic progressive care unit following surgery.  His PPM was interrogated post-op and functioning as programmed. Their rhythm remained stable overnight.  The patient's overall hospital course was uneventful.  He increased his activity and oral intake.  A post-operative echocardiogram demonstrated a 26 mm Sapien S3 Resilia bioprosthetic aortic valve present. The prosthetic valve is normal. No stenosis. No regurgitation. He was discharged with furosemide 40mg  daily and KCl supplementation while holding spironolactone. The patient had normal bowel and bladder function.  The patient was stable to be discharged home on post operative day#1.    Pending items needing follow up:  Structural heart team f/u. Anti-HTN meds held and started KCl while off spiro.     Condition at Discharge: Stable     Discharge Diagnoses:    Hospital Problems          Active Problems    * (Principal) S/p TAVR (transcatheter aortic valve replacement), bioprosthetic    Hypercholesterolemia    Hypothyroidism    Hypertension    Chronic diastolic heart failure, NYHA class 2 (HCC)    Atrial flutter (HCC)    Dyslipidemia    Bradycardia    DM (diabetes mellitus) type II, controlled, with peripheral vascular disorder (HCC)    Atrial  fibrillation [facility])    Cardiac pacemaker    PAF (paroxysmal atrial fibrillation) (HCC)    Presence of Watchman left atrial appendage closure device    Coronary artery disease due to lipid rich plaque    Atherosclerosis of aorta (HCC)    Former smoker    Bilateral carotid artery stenosis    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 Ky Barban and Dr. Argie Ramming -   1. Temporary balloon-tipped pacemaker.   2. Ascending aortography with digital subtraction.   3. Placement of number 26 Sapien S3 Resilia transcatheter aortic valve.    Significant Diagnostic Studies and Procedures: noted in brief hospital course  Limited POD#1 Echocardiogram:     The left ventricular size is normal. Moderate concentric hypertrophy. The left ventricular systolic function is normal. The visually estimated ejection fraction is 60%. There are segmental wall motion abnormalities, as described below.    The right ventricle is probably normal in size. The right ventricular systolic function is probably normal.    The pulmonary artery pressure could not be estimated due to inadequate tricuspid regurgitation signal.    Left Atrium: Moderately dilated.    Right Atrium: Mildly dilated.    Aortic Valve: There is a 26 mm Sapien S3 Resilia bioprosthetic valve present. The prosthetic valve is normal. No stenosis. No regurgitation.    No pericardial effusion.    When compared to previous study dated 03/26/2023, interval placement of TAVR Sapien S3 Resilia bioprosthetic valve    Consults:  None    Patient Disposition: Home       Patient instructions/medications:      Cardiac Diet    Limiting unhealthy fats and cholesterol is the most important step you can take in reducing your risk for cardiovascular disease. Unhealthy fats include saturated and trans fats. Monitor your sodium and cholesterol intake. Restrict your sodium to 2g (grams) or 2000mg  (milligrams) daily, and your cholesterol to 200mg  daily.    If you have questions regarding your diet at home, you may contact a dietitian at [phone].     Other Activity Restrictions    -You should and need to walk daily. Your goal is to walk 30 minutes at at time without stopping for breaks. This is a daily exercise routine that you should start as soon as you arrive home. Your basic daily activities do not count toward your 30 minute minimum, e.g. housework, toileting, fixing meals. Do not exercise outside in extremely hot or cold temperatures.  -Driving: NO driving for a minimum of 3 days. In addition, if you are using narcotic pain medication or experiencing unsteadiness, dizziness, lightheadedness, or instability in movement, you should not be operating any type of vehicle; car, tractor, etc. Your surgeon may provide additional driving restrictions at their discretion based on the type and extent of surgery you had. Safely operating a vehicle is the responsibility of the driver.   -Lifting: NO lifting more than 10 pounds (gallon of milk) for 2 weeks.  -Monitoring: If you have access to a home blood pressure cuff, record your blood pressure and heart rate daily.   Please call if your systolic blood pressure is <90 or >160, OR  if your heart rate is <50 or >120 at rest     Incision Care and Bathing Instructions    Keep your incision(s) clean and  dry.  Spray your incision with the provided wound cleanser twice a day until your incision is healed, or the bottle is empty.   It is ok to shower unless otherwise noted.  Do not soak in a bath tub, hot tub, pool or lake for 6 weeks.  Do not use creams or lotions on incision until it is fully healed.  If your surgeon used Dermabond (skin glue), this will fall off gradually, do not pick at the glue.  If you notice redness, swelling, drainage, foul odor, or sutures sticking up through your incision, please call the Cardiothoracic Surgery clinic immediately.  [phone]     Report These Signs and Symptoms    Please contact your doctor for the following symptoms:  *Temperature over 100 degrees F  *Uncontrolled pain  *Drainage with a foul odor  *Shortness of breath  *Racing or skipping heart beats  *Swelling or tenderness in your groin  *Painful/Cold/Discolored legs or toes  *Suture material sticking up through your incisions  *Change in coordination of ability to talk  *If you gain more than 2 pounds in 24 hours, or 5 pounds in one week.     26-Month Post-Operative Follow Up Appointment    Echocardiogram at 2:45p, appointment will follow.  Check-in for your appointment at the Cardiovascular Medicine and Cardiovascular and Thoracic Surgery office located by the main entrance of the hospital.     [place] Provider Dyanne Iha [[id number]]    [facility]    Appointment date: 07/18/2023    Appointment time: 2:45 PM      Primary Care Provider Appointment    Hoyt Koch [phone]  Call to schedule an appointment with your primary care doctor in 1-2 weeks for a checkup and medication review.     1-Week Post-Operative Follow Up Appointment    Check-in for your appointment at the Cardiovascular Medicine and Cardiovascular and Thoracic Surgery office located by the main entrance of the hospital.     [place] Provider Dyanne Iha [[id number]]    Appointment date: 06/11/2023    Appointment time: 8:00 AM      Outpatient Cardiac Rehabilitation Instructions    Your physician has referred you to participated in outpatient cardiac rehabilitation. Contact [facility] Cardiac Rehabilitation Department at [phone] to schedule an appointment.    If you will be attending cardiac rehab at a different facility, a referral will be sent to the facility of your choice.  If you have not heard from that facility within 2 weeks after you have been discharged, please call them to schedule an appointment.    Referral to outpatient cardiac rehab:    Outside referral faxed:    Internal referral sent to:       Heart Failure Information    You are at risk for readmission to the hospital because of fluid overload. There are steps you can take to lower your risk:  *Continue your current heart medications. Changes made have been made during your hospital stay.  *Remember to weigh yourself every morning, first thing after you urinate, and write down your weigh. Take this record to your doctor's appointments.  *Chart your symptoms - fatigue, shortness of breath, swelling, etc. Immediately report any worsening.  *Remember to consume no more than 2,000mg  (milligrams) of sodium daily. Watch out for packaged, processed, canned, and restaurant foods especially.  *If any of your symptoms worsen, or if you gain more than 2 pounds in 24 hours, or 5 pounds  in a week, call your cardiologist immediately. EARLY REPORTING OF THESE CHANGES IS VERY IMPORTANT.  Condition Code     Risk Reduction Plan    Cardiac Event Personal Risk Factor Reduction Plan  **Take this sheet to your physician to show treatment recommendations**  Tymell Gladstone Heffner  Admission Date: (Not on file) LOS: @LOS @       Blood Pressure Risk  Goal: Keep blood pressure below 130/80  Your Numbers:  BP Readings from Last 1 Encounters:  05/22/23 : 131/74     Plan: High blood pressure is the single most important risk factor for cardiac events because it's the #1 cause of cardiac events.  Take medication as prescribed and monitor your blood pressure.    Abnormal lipids (fats in blood) Risk   Goal: Total Cholesterol: <200  LDL (bad cholesterol): primary prevention <100   LDL (bad cholesterol): secondary prevention < 70  HDL (good cholesterol): >40 for men, >50 for women  Triglycerides: <150  Your Numbers: Cholesterol       Date                     Value               Ref Range           Status                03/27/2023               71                  <200 MG/DL          Final            ----------  LDL       Date                     Value               Ref Range           Status                03/27/2023               21                  <100 mg/dL          Final            ----------  HDL       Date                     Value               Ref Range           Status                03/27/2023               42                  >40 MG/DL           Final            ----------  Triglycerides       Date                     Value  Ref Range           Status                03/27/2023               124                 <150 MG/DL          Final            ----------  Plan: Diets high in saturated fat, trans fat and cholesterol can raise blood cholesterol levels increasing your risk of having a cardiac event.  Take medication as prescribed and eat a heart healthy, low sodium diet.    Smoking Risk   Goals: If you smoke, STOP!   Plan: Smoking DOUBLES your risk of having a cardiac event. Quitting can greatly reduce your risk. To register for smoking cessation program call [phone] or visit www.smokefree.gov    Diabetes Risk   Goal: Non-diabetic: Below 5.7%  Goal for diabetic: Less than 7%  Your Numbers: Hemoglobin A1C       Date                     Value               Ref Range           Status                03/26/2023               6.7 (H)             4.0 - 5.7 %         Final              Comment:    The ADA recommends that most patients with type 1 and type 2 diabetes maintain     an A1c level <7%.      ----------  Plan: If you have diabetes, even if treated, you are at an increased risk of having a cardiac event.     Alcohol Use Risk  Goal: Alcohol use can lead to a cardiac event.  Plan: For men, limit intake to no more than 2 drinks per day.  For women, limit intake to no more than one drink per day.    Weight Management Risk   Goal: Healthy: BMI is 18.5 to 24.9  Overweight: BMI is 25 to 29.9  Obese: BMI is 30 or higher  Morbid Obesity: BMI [...]  Your Numbers: Your    Plan: If you have questions about your diet after you go home, you can call a dietitian at [phone]    Physical Activity Risk   Goal: Patients should have approval by a physician prior to beginning an exercise program.  Plan: Try to get at least 30 minutes of moderate physical activity five days a week or 20 minutes of vigorous physical activity three days a week with your doctor's approval.    To the Neurology and the NeuroSurg Discharge order sets set add a new order in the education section titled Risk Reduction Plan, in the comments section add the following (default select this new order for neuro and neuro surg and ensure this information is added to the AVS).     Additional Discharge Instructions    -You MUST take antibiotics prior to any dental procedures (including cleaning), invasive respiratory tract procedures and invasive skin procedures.  You should NOT have any of these procedure for 3 months after your valve surgery.  This will help to prevent infection to the heart valve.  Contact your primary care provider or cardiologist for an antibiotic prescription when needed.    -You have been prescribed Over-the-Counter medications.  These medications are very important for you to pick-up at the pharmacy and take them as directed.     Questions About Your Stay    For questions or concerns regarding your hospital stay, call [phone] during regular business hours, Monday through Friday, 8am-4pm.  During the evenings or weekends, please call [phone].     Discharging attending physician: Zella Richer [[id number]]      Appointment Request: Cardiac Rehab    PT TO START CARDIAC REHAB 1 WEEK AFTER DISCHARGE  Ordering of Outpatient Cardiac Rehabilitation includes:   - Outpatient Cardiac Rehabilitation per department protocol, 2-3x per week for up to a total of 36 sessions   - Initial evaluation including or baseline exercise testing  - Psychosocial, fitness, nutritional, medication, and risk factor assessment and education  - Development of individualized treatment plans (ITPs)  - If patient is diabetic, blood glucose testing as needed per departmental protocol    o Blood sugar monitoring and provision of 3 glucose tablets or ? 15 g carbohydrate snack for hypoglycemia based on   departmental protocol   - Initiation of all emergency protocols, as indicated per hospital and departmental policies, including, but not limited to    o Administration of nitroglycerin .4mg  sublingual for persistent, unstable, new-onset, or stable angina that doses not resolve  after 5 minutes of rest.  May repeat up to a total of 3 doses per protocol.    o Obtaining a 12-Lead EKG if indicated.    o Initiation and/or titration of O2 per departmental protocol for oxygen saturation <88% via pulse oximeter.   - Upon completion of the Cardiac Rehab program criteria, patient may continue into a maintenance program     Diagnosis Information: Valve Replacement    Pager # of the requesting provider if any questions? [phone]       Current Discharge Medication List         START taking these medications    Details   acetaminophen (TYLENOL) 325 mg tablet Take two tablets by mouth every 6 hours as needed. Indications: backache, fever, headache, muscle pain, pain, pain associated with arthritis    PRESCRIPTION TYPE:  OTC      potassium chloride SR (KLOR-CON M20) 20 mEq tablet Take one tablet by mouth daily. Take while taking furosemide.  Indications: prevention of low potassium in the blood  Qty: 90 tablet, Refills: 0    PRESCRIPTION TYPE:  Normal      sennosides-docusate sodium (SENOKOT-S) 8.6/50 mg tablet Take two tablets by mouth twice daily. Indications: constipation    PRESCRIPTION TYPE:  OTC      traMADoL (ULTRAM) 50 mg tablet Take one tablet by mouth every 6 hours as needed. Indications: pain  Qty: 5 tablet, Refills: 0    PRESCRIPTION TYPE:  Normal            CONTINUE these medications which have been CHANGED or REFILLED    Details   losartan (COZAAR) 100 mg tablet Take one-half tablet by mouth daily. Indications: high blood pressure  Qty: 90 tablet, Refills: 3    PRESCRIPTION TYPE:  Normal      metFORMIN (GLUCOPHAGE) 500 mg tablet Take one tablet by mouth twice  daily with meals. Ok to resume 06/06/23  Indications: type 2 diabetes mellitus  Qty: 60 tablet, Refills: 3    PRESCRIPTION TYPE:  Normal  Associated Diagnoses: Nonrheumatic aortic valve stenosis; Coronary artery disease due to lipid rich plaque            CONTINUE these medications which have NOT CHANGED    Details   aspirin EC (ASPIR-LOW) 81 mg tablet Take one tablet by mouth daily. Indications: s/p Watchman  Qty: 90 tablet, Refills: 1    PRESCRIPTION TYPE:  OTC      bisoprolol fumarate (ZEBETA) 5 mg tablet Take one-half tablet by mouth daily. Indications: myocardial reinfarction prevention  Qty: 90 tablet, Refills: 3    PRESCRIPTION TYPE:  Normal      calcium carbonate (TUMS) 500 mg (200 mg elemental calcium) chewable tablet Chew one tablet by mouth daily as needed.    PRESCRIPTION TYPE:  Historical Med      CHOLEcalciferoL (vitamin D3) (VITAMIN D3) 50 mcg (2,000 unit) tablet Take one tablet by mouth every 48 hours.    PRESCRIPTION TYPE:  Historical Med      coenzyme Q10 100 mg cap Take one capsule by mouth three times weekly. Monday, Wednesday and Friday.    PRESCRIPTION TYPE:  Historical Med      Cranberry 500 mg cap Take one capsule by mouth daily.    PRESCRIPTION TYPE:  Historical Med      cyanocobalamin (vitamin B-12) 1,000 mcg tablet Take one tablet by mouth every 48 hours.    PRESCRIPTION TYPE:  Historical Med      evolocumab (REPATHA SURECLICK) 140 mg/mL injectable PEN Inject 1 mL under the skin every 14 days.  Qty: 2 mL, Refills: 11    PRESCRIPTION TYPE:  Normal      ezetimibe (ZETIA) 10 mg tablet TAKE 1 TABLET EVERY DAY  Qty: 90 tablet, Refills: 3    PRESCRIPTION TYPE:  Normal  Associated Diagnoses: Nonrheumatic aortic valve stenosis      ferrous sulfate (FEOSOL) 325 mg (65 mg iron) tablet Take one tablet by mouth three times weekly. Take on an empty stomach at least 1 hour before or 2 hours after food. Takes on Monday, Wednesday and Friday    PRESCRIPTION TYPE:  Historical Med      furosemide (LASIX) 40 mg tablet TAKE ONE TABLET DAILY. MAY TAKE AN ADDITIONAL LASIX AS NEEDED (DOSE ADJUSTMENT)  Qty: 90 tablet, Refills: 3    PRESCRIPTION TYPE:  Normal      glucosamine/chondro su A/C/Mn (GLUCOSAMINE-CHONDROITIN COMPLX PO) Take 1 tablet by mouth daily.    PRESCRIPTION TYPE:  Historical Med      levothyroxine (SYNTHROID) 175 mcg tablet Take one tablet by mouth daily 30 minutes before breakfast.    PRESCRIPTION TYPE:  Historical Med      magnesium glycinate 100 mg magnesium capsule Take one capsule by mouth every 48 hours.    PRESCRIPTION TYPE:  Historical Med omeprazole DR(+) (PRILOSEC) 20 mg PO capsule Take one capsule by mouth daily.    PRESCRIPTION TYPE:  Historical Med      rosuvastatin (CRESTOR) 20 mg tablet Take one tablet by mouth daily.  Qty: 90 tablet, Refills: 3    PRESCRIPTION TYPE:  Normal  Associated Diagnoses: Hypercholesterolemia; Coronary artery disease due to lipid rich plaque; Secondary hypertension      vitamins, multiple tablet Take one tablet by mouth every 48 hours.    PRESCRIPTION TYPE:  Historical Med  The following medications were removed from your list. This list includes medications discontinued this stay and those removed from your prior med list in our system        amLODIPine (NORVASC) 5 mg tablet        hydrALAZINE (APRESOLINE) 50 mg tablet        spironolactone (ALDACTONE) 25 mg tablet               Scheduled appointments:      Jun 11, 2023 8:00 AM  Telehealth visit with Dyanne Iha, APRN-NP  Cardiovascular Medicine: [facility] (CVM Exam) [address]  [address], [address]  [place] [address]  [phone]     Jul 18, 2023 2:45 PM  Echo Doppler with [facility]  Cardiovascular Medicine: [facility] (CVM Procedural) [address]  [address], [address]  [place] [address]  [phone]     Jul 18, 2023 4:00 PM  Valve Return with Dyanne Iha, APRN-NP  Cardiovascular Medicine: [facility] (CVM Exam) [address]  [address], [address]  [place] [address]  [phone]     Nov 29, 2023 10:30 AM  Office visit with Hester Mates, MD  Cardiovascular Medicine: [facility] (CVM Exam) [address]  [address]  [place] [address]  [phone]            Signed:  Donita Brooks, APRN-NP  06/05/2023      cc:  Primary Care Physician:  Hoyt Koch     Referring physicians:  Hester Mates, MD   Additional provider(s):        Referral to outpatient cardiac rehab:    Outside referral faxed:    Internal referral sent to:

## 2023-05-30 ENCOUNTER — Ambulatory Visit: Admit: 2023-05-30 | Discharge: 2023-05-31 | Payer: MEDICARE

## 2023-05-30 ENCOUNTER — Encounter: Admit: 2023-05-30 | Discharge: 2023-05-30 | Payer: MEDICARE

## 2023-05-30 ENCOUNTER — Ambulatory Visit: Admit: 2023-05-30 | Discharge: 2023-05-30 | Payer: MEDICARE

## 2023-05-30 ENCOUNTER — Ambulatory Visit: Admit: 2023-05-30 | Discharge: 2023-05-30

## 2023-05-30 ENCOUNTER — Ambulatory Visit: Admit: 2023-05-30 | Discharge: 2023-05-31

## 2023-05-30 ENCOUNTER — Inpatient Hospital Stay: Admit: 2023-05-30 | Discharge: 2023-05-30 | Payer: MEDICARE

## 2023-05-30 DIAGNOSIS — E1151 Type 2 diabetes mellitus with diabetic peripheral angiopathy without gangrene: Secondary | ICD-10-CM

## 2023-05-30 DIAGNOSIS — I480 Paroxysmal atrial fibrillation: Secondary | ICD-10-CM

## 2023-05-30 DIAGNOSIS — Z950 Presence of cardiac pacemaker: Secondary | ICD-10-CM

## 2023-05-30 DIAGNOSIS — I6523 Occlusion and stenosis of bilateral carotid arteries: Secondary | ICD-10-CM

## 2023-05-30 DIAGNOSIS — I5032 Chronic diastolic (congestive) heart failure: Secondary | ICD-10-CM

## 2023-05-30 DIAGNOSIS — I10 Essential (primary) hypertension: Secondary | ICD-10-CM

## 2023-05-30 DIAGNOSIS — I2510 Atherosclerotic heart disease of native coronary artery without angina pectoris: Secondary | ICD-10-CM

## 2023-05-30 DIAGNOSIS — R0989 Other specified symptoms and signs involving the circulatory and respiratory systems: Secondary | ICD-10-CM

## 2023-05-30 DIAGNOSIS — I350 Nonrheumatic aortic (valve) stenosis: Secondary | ICD-10-CM

## 2023-05-30 NOTE — Progress Notes
Pre-op education appt complete with pt, Santina Evans, wife, will be w/ pt on DOS.  Informed of current visitor policy.  Reviewed and provided written pre-op instructions.  Instructed pt to be NPO @ 2300 the night before surgery, except for a sip of water to take any meds that the [facility] pharmacist instructs pt to take on the morning of surgery.  Instructed to cont taking 81 mg aspirin as prescribed, including on the morning of surgery.  As per written instructions, pt to take 2 preop showers w/ provided CHG 4% soap prior to coming to the hospital for surgery. Instructed to check in at [facility] Admissions at TBD on DOS.  Discussed process for pre-op, intra-op and post-op recovery.  Discussed post-op pain; lifting and driving restrictions; importance of mobilization and s/s of infection after discharge.  Instructed to call the CTS office if having any health changes prior to surgery.  Pt verbalized understanding of all instructions.  Denies skin or dental issues; denies s/s UTI. KCCQ complete - see flowsheet.  14M walks complete - see additional note.   Escorted pt to radiology for CXR.  All questions answered to the pt's satisfaction.  Escorted to [facility] for Pre-registration and then to [facility]. Preop labs to be collected by [facility].   AF

## 2023-05-30 NOTE — Progress Notes
Date of Service: 05/30/2023    Mason Lawson is a 77 y.o. male.       HPI       I had the pleasure of seeing Mason Lawson for an updated H&P prior to TAVR.  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  [name] on September 23 and had complaints of chest discomfort therefore he was admitted directly to the hospital for further evaluation.  He had an echocardiogram which revealed an EF of 60 to 65%.  He grade 2 diastolic dysfunction.  There was severe aortic stenosis and mild regurgitation.  The mean gradient was 40 mmHg with a velocity of 4.3 m/s.  There was mild mitral regurgitation and moderate mitral annular calcification.  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  [name] and was noted to have a patent LIMA to the LAD, patent RIMA Y graft to the OM and patent radial to the PDA with a patent proximal stent.  The valve team was consulted and felt he was a candidate for TAVR.  [name].  Carotid duplex showed 70% right carotid artery stenosis.  [name] who ordered a CTA neck.  This showed an 80% proximal right ICA stenosis but because he was asymptomatic medical management was recommended.  He is scheduled for TAVR on December 2.    Mason Lawson has been stable since discharge.  He continues to have mild shortness of breath and some orthopnea.  He also continues to have some chest discomfort. He states overall his edema has improved.  He denies palpitations, near-syncope or syncope.           Vitals:    05/30/23 1040   BP: 114/42   BP Source: Arm, Left Upper   Pulse: 81   PainSc: Zero   Weight: 100.7 kg (222 lb)   Height: 180.3 cm (5' 11)     Body mass index is 30.96 kg/m?[name] Past Medical History  Patient Active Problem List    Diagnosis Date Noted    Bilateral carotid artery stenosis 05/30/2023    Atherosclerosis of aorta (HCC) 05/29/2023    Former smoker 05/29/2023    Aortic valve disease 03/26/2023    Acute and chronic respiratory failure with hypoxia (HCC) 12/05/2022    Coronary artery disease due to lipid rich plaque 06/22/2022    Presence of Watchman left atrial appendage closure device 02/21/2022     02/20/2022 - Dr. Milas Kocher; Left atrial appendage occlusion with 31 mm Watchman FLX device      PAF (paroxysmal atrial fibrillation) (HCC) 02/20/2022    Cardiac pacemaker 05/16/2021     01/21/2021 - Successful Dual-chamber Pacemaker Implantation with Dr. Derrell Lolling      Atrial fibrillation [facility]) 05/13/2021    Bradycardia 02/01/2021    DM (diabetes mellitus) type II, controlled, with peripheral vascular disorder (HCC) 02/01/2021    Paroxysmal nocturnal dyspnea 02/01/2021    Atrial flutter (HCC) 01/19/2021     01/21/2021 - AFL RFA with Dr. Wallene Huh:  Successful cavo-tricuspid isthmus ablation.  Comprehensive EP study with coronary sinus catheter placement.  Underlying severe sinus node dysfunction. No escape rhythm.      Chronic heart failure with preserved ejection fraction (HCC) 12/04/2018  RIGHT HEART CATHETERIZATION:  06/2018 in the setting of severe hypertension  HEMODYNAMICS:   Blood pressure 198/81 with a mean of 130 mmHg. Heart rate 50 beats per minute.   PRESSURES:  The right atrial pressure was 4 mmHg. Right ventricular pressure was 64/8 mmHg.  The pulmonary artery pressure was 60/70 mmHg with a mean of 32 mmHg. The pulmonary capillary wedge pressure was 17 mmHg. The transpulmonary gradient was 15 mmHg.  The diastolic pulmonary gradient was 0 mmHg. The pulmonary vascular resistance was 2 Wood units.   RIGHT SATURATIONS: The right atrial saturation 77%. The pulmonary artery saturation was 75%. The aortic saturation was 96%.   OUTPUTS: The cardiac output by Fick method was 5.8 L/minute with a cardiac index of 2.7 L/minute per sq m. The cardiac index by thermodilution was 7.3 with a cardiac index of 3.4 L/minute per sq m.      Nonrheumatic aortic valve stenosis 06/06/2018     07/25/2021 - ECHO:  A septal wall motion abnormality is noted consistent with prior cardiac surgery.  No other regional wall motion abnormalities are noted. Overall left ventricular systolic function appears normal and dynamic. The estimated left ventricular ejection fraction is 65%.  There is mild concentric left ventricular hypertrophy.  Mild right ventricular enlargement with normal right ventricular systolic function. Mild left atrial enlargement.  Moderate aortic valve stenosis.  No pericardial effusion is seen.      Dyslipidemia 10/23/2012    Chest pain 09/26/2010    Abnormal stress test 09/26/2010    Murmur 09/23/2010    Coronary artery disease      A. Onset mid 2004, progression fall 2005, patient reduces activity at work to avoid symptoms  B. 12/05 Progressive angina, Dr. Duanne Limerick, then 06/29/04 CBP Atch OV, referred for cath          >12/29 Cath 100%RCA, 75%LAD&Cx,50%LM EF 55%          >07/01/05 CABGx3 LIMA-LAD w/ Y-RIMA-OM, Rad-PDA per Dr. Ky Barban  C. 06/29/08 exercise echocardiographic images demonstrated basal septal ischemia, significant hypertensive response to exercise, good and adequate exercise tolerance with no angina.  D. LHC 06/2018 for new heart failure symptoms: LEFT HEART CATHETERIZATION:  HEMODYNAMICS:  The aortic pressure was- 195/63 with a mean of 105 mmHg .  The left ventricular systolic pressure was 210 mmHg.  There was a peak to peak gradient of 15 mmHg.  Left main coronary artery:  Left main coronary artery arises normally from the left coronary sinus.  The left main coronary artery bifurcates into left anterior descending and left circumflex artery and is free of angiographically significant disease.  Left anterior descending:  Left anterior descending is a large vessel which gives rise to- 3 diagonal vessels. 1 diagonal has a 80% ostial stenosis, the rest of diagonal vessels do not appear to be angiographically significant. Distal LAD has competitive flow from the LIMA.   Left circumflex artery:  The left circumflex artery appears to be medium caliber and has a 100% stenosis proximally. The mid-to-distal portion of the vessel fills from bridging collaterals from proximal portion and is diffusely disease. Three obtuse marginal branches arise, which are also diffusely diseased.   Right coronary artery:  The right coronary artery is a medium caliber vessel, but has 100% chronic total occlusion after the takeoff of conus branch. The PDA fills retrogradely from the radial graft to supply the PLV and has mild plaquing in the native PDA.    BYPASS GRAFT ANATOMY:  1.The LIMA to  LAD was visualized using IM catheter.  The ostium, body was noted to be free of any angiographic disease, but has a 80% stenosis at the anastamosis with diffusely diseased distal LAD. There is a Y jump graft from the LIMA to the OM branch which fills retrogradely.            2. The radial artery graft to the PDA appears to be without any angiographically significant disease at ostium, body or anastomotic site.   E. Regadenosom MPI 11/2018 TID Ratio:  1.05  (normal <1.36). Summed Stress Score:  3   , Summed Rest Score:  0 Quantitative polar maps show a statistically insignificant amount of inferoapical ischemia. Mild inferobasilar septal hypokinesis.       Left Ventricular Ejection Fraction (post stress, in the resting state) =  52 %.  Left Ventricular End Diastolic Volume: 161 mL       SUMMARY/OPINION:   This study is borderline abnormal with limited reversible uptake in the inferior wall.  All segments are viable.  Global left ventricular function is within normal limits. In aggregate the current study is low risk in regards to predicted annual cardiovascular mortality rate.         Hypercholesterolemia      A.  02/28/01 total 324 total 249 HDL 43 LDL 231 Pt deferred meds  B. 06/29/04 total 273 trig 193 HDL 36 LDLd206 Zocor begun dc'd by pt d/t statinphobia and cost  C.  2/24 Rx Vytorin 10/80 1/2 tab.      Hypothyroidism      A.  2004 presentation with TSH over 200, Rx w l-thyroxine      Hypertension      A.  2/06 Altace 5 dc'd w/ dysgeusia, Metoprolol 25 BID begun         Tests ordered      A. 07/17/08 carotid artery duplex scan: no significant stenosis           Review of Systems   Constitutional: Negative.   HENT: Negative.     Eyes: Negative.    Cardiovascular:  Positive for chest pain (mild chest pain randomly).   Respiratory: Negative.     Endocrine: Negative.    Hematologic/Lymphatic: Negative.    Skin: Negative.    Musculoskeletal: Negative.    Gastrointestinal: Negative.    Genitourinary: Negative.    Neurological: Negative.    Psychiatric/Behavioral: Negative.     Allergic/Immunologic: Negative.        Physical Exam  General Appearance: no acute distress  Skin: warm & intact  HEENT: unremarkable  Neck Veins: neck veins are flat & not distended  Carotid Arteries: no bruits  Chest Inspection: chest is normal in appearance  Auscultation/Percussion: lungs clear to auscultation, no rales, rhonchi, or wheezing  Cardiac Rhythm: regular rhythm & normal rate  Cardiac Auscultation: Normal S1 & S2, no S3 or S4, no rub  Murmurs: 3/6 systolic murmur  Extremities: no lower extremity edema; 2+ symmetric distal pulses  Abdominal Exam: soft, non-tender, no masses, bowel sounds normal  Liver & Spleen: no organomegaly  Neurologic Exam: oriented to time, place and person; no focal neurologic deficits  Psychiatric: Normal mood and affect.  Behavior is normal. Judgment and thought content normal.       Cardiovascular Studies  Preliminary EKG:    AV paced.  Rate 81 bpm.      Cardiovascular Health Factors  Vitals BP Readings from Last 3 Encounters:   05/30/23 114/42   05/30/23 114/43  05/22/23 131/74     Wt Readings from Last 3 Encounters:   05/30/23 100.7 kg (222 lb)   05/30/23 101.1 kg (222 lb 12.8 oz)   05/22/23 99.7 kg (219 lb 12.8 oz)     BMI Readings from Last 3 Encounters:   05/30/23 30.96 kg/m?   05/30/23 30.64 kg/m?   05/22/23 30.23 kg/m?      Smoking Social History     Tobacco Use   Smoking Status Former    Current packs/day: 0.00    Average packs/day: 1 pack/day for 40.0 years (40.0 ttl pk-yrs)    Types: Cigarettes    Start date: 06/02/1964    Quit date: 06/02/2004    Years since quitting: 19.0   Smokeless Tobacco Never      Lipid Profile Cholesterol   Date Value Ref Range Status   03/27/2023 71 <200 MG/DL Final     HDL   Date Value Ref Range Status   03/27/2023 42 >40 MG/DL Final     LDL   Date Value Ref Range Status   03/27/2023 21 <100 mg/dL Final     Triglycerides   Date Value Ref Range Status   03/27/2023 124 <150 MG/DL Final      Blood Sugar Hemoglobin A1C   Date Value Ref Range Status   03/26/2023 6.7 (H) 4.0 - 5.7 % Final     Comment:     The ADA recommends that most patients with type 1 and type 2 diabetes maintain   an A1c level <7%.       Glucose   Date Value Ref Range Status   05/30/2023 130 (H) 70 - 100 mg/dL Final   45/40/9811 914 (H) 70 - 100 MG/DL Final   78/29/5621 308 (H) 70 - 100 MG/DL Final     Glucose, POC   Date Value Ref Range Status   03/28/2023 100 70 - 100 MG/DL Final   65/78/4696 295 (H) 70 - 100 MG/DL Final   28/41/3244 010 (H) 70 - 100 MG/DL Final          Problems Addressed Today  Encounter Diagnoses   Name Primary?    Nonrheumatic aortic valve stenosis Yes    Cardiovascular symptoms     Primary hypertension     Coronary artery disease due to lipid rich plaque     DM (diabetes mellitus) type II, controlled, with peripheral vascular disorder (HCC)     Chronic heart failure with preserved ejection fraction (HCC)     Cardiac pacemaker     Paroxysmal atrial fibrillation (HCC)     Bilateral carotid artery stenosis      STS risk score  Procedure Type: Isolated AVR  Perioperative OutcomeEstimate %  Operative Mortality2.05%  Morbidity & Mortality12.1%  Stroke1.75%  Renal Failure0.995%  Reoperation4.3%  Prolonged Ventilation6.26%  Deep Sternal Wound Infection0.128%  Long Hospital Stay (>14 days)6.74%  [facility] Stay (<6 days)27.7%    Assessment and Plan       1.  Aortic stenosis.  He has severe aortic stenosis and is a candidate for TAVR.  He is scheduled for the procedure on December 2.  We discussed the preprocedure instructions and procedure in detail and all questions were answered.    2.  Coronary artery disease.  He has had a prior CABG.  Cardiac cath showed patent grafts.  He continues to have some chest discomfort that may be related to his aortic stenosis.    3.  Chronic HFpEF.  He is NYHA  class II symptoms.  He is well compensated.    4.  Paroxysmal A-fib.  He has a Youth worker in place.    5.  Carotid artery disease.  He has an 80% right carotid artery stenosis.  He was seen by vascular surgery and they recommend medical management.  He has had no strokelike symptoms.    6.  Type 2 diabetes    7.  Sinus node dysfunction with a pacemaker in place.    8.  Hypertension.  Blood pressure is well-controlled.    Utilizing a shared decision-making process, we discussed the nature and progression of valvular heart disease, goals of care, and treatment options including open surgery, transcatheter approach and conservative management and the risks and benefits of each option.      Thank you for allowing [place] to participate in the care of this pleasant individual.  If you have any other questions or concerns, please do not hesitate to contact us.    Sherrlyn Hock, APRN  Structural Heart Nurse Practitioner           Current Medications (including today's revisions)   amLODIPine (NORVASC) 5 mg tablet TAKE 1 TABLET TWICE DAILY    aspirin EC (ASPIR-LOW) 81 mg tablet Take one tablet by mouth daily. Indications: s/p Watchman    bisoprolol fumarate (ZEBETA) 5 mg tablet Take one-half tablet by mouth daily. Indications: myocardial reinfarction prevention    calcium carbonate (TUMS) 500 mg (200 mg elemental calcium) chewable tablet Chew one tablet by mouth daily as needed.    CHOLEcalciferoL (vitamin D3) (VITAMIN D3) 50 mcg (2,000 unit) tablet Take one tablet by mouth every 48 hours.    coenzyme Q10 100 mg cap Take one capsule by mouth three times weekly. Monday, Wednesday and Friday.    Cranberry 500 mg cap Take one capsule by mouth daily.    cyanocobalamin (vitamin B-12) 1,000 mcg tablet Take one tablet by mouth every 48 hours.    evolocumab (REPATHA SURECLICK) 140 mg/mL injectable PEN Inject 1 mL under the skin every 14 days.    ezetimibe (ZETIA) 10 mg tablet TAKE 1 TABLET EVERY DAY    ferrous sulfate (FEOSOL) 325 mg (65 mg iron) tablet Take one tablet by mouth three times weekly. Take on an empty stomach at least 1 hour before or 2 hours after food. Takes on Monday, Wednesday and Friday    furosemide (LASIX) 40 mg tablet TAKE ONE TABLET DAILY. MAY TAKE AN ADDITIONAL LASIX AS NEEDED (DOSE ADJUSTMENT) (Patient taking differently: Take one tablet by mouth daily. Pt takes 40 mg one day and 20 mg the next (alternating dosages daily))    glucosamine/chondro su A/C/Mn (GLUCOSAMINE-CHONDROITIN COMPLX PO) Take 1 tablet by mouth daily.    hydrALAZINE (APRESOLINE) 50 mg tablet TAKE 1 TABLET TWICE DAILY    levothyroxine (SYNTHROID) 175 mcg tablet Take one tablet by mouth daily 30 minutes before breakfast.    losartan (COZAAR) 100 mg tablet TAKE 1 TABLET EVERY DAY    magnesium glycinate 100 mg magnesium capsule Take one capsule by mouth every 48 hours.    metFORMIN (GLUCOPHAGE) 500 mg tablet Take one tablet by mouth twice daily with meals. Ok to resume 03/30/2023  Indications: type 2 diabetes mellitus    omeprazole DR(+) (PRILOSEC) 20 mg PO capsule Take one capsule by mouth daily.    rosuvastatin (CRESTOR) 20 mg tablet Take one tablet by mouth daily.    spironolactone (ALDACTONE) 25 mg tablet TAKE 1 TABLET EVERY DAY WITH FOOD  vitamins, multiple tablet Take one tablet by mouth every 48 hours.

## 2023-05-30 NOTE — Progress Notes
5 Meter walks complete:     1st Trial = 5.83 seconds  2nd Trial = 5.85 seconds  3rd Trial = 5.81 seconds

## 2023-06-04 ENCOUNTER — Inpatient Hospital Stay: Admit: 2023-06-04 | Discharge: 2023-06-05 | Disposition: A | Discharge status: Home / Self Care | Payer: MEDICARE

## 2023-06-04 ENCOUNTER — Encounter: Admit: 2023-06-04 | Discharge: 2023-06-04 | Payer: MEDICARE

## 2023-06-04 ENCOUNTER — Inpatient Hospital Stay: Admit: 2023-06-04 | Discharge: 2023-06-05

## 2023-06-04 ENCOUNTER — Inpatient Hospital Stay: Admit: 2023-06-04 | Discharge: 2023-06-04 | Payer: MEDICARE

## 2023-06-04 LAB — PREPARE RBC - SUNQUEST
ANTIBODY SCREEN: NEGATIVE
UNIT DIVISION: 0
UNIT DIVISION: 0
UNIT DIVISION: 0
UNIT DIVISION: 0
UNITS ORDERED: 4

## 2023-06-04 LAB — TYPE & CROSSMATCH
~~LOC~~ BKR ANTIBODY SCREEN: NEGATIVE
~~LOC~~ BKR UNITS ORDERED: 4

## 2023-06-04 LAB — BASIC METABOLIC PANEL
~~LOC~~ BKR ANION GAP: 9 ms (ref 3–12)
~~LOC~~ BKR SODIUM, SERUM: 139 mmol/L (ref 137–147)

## 2023-06-04 LAB — POC ACTIVATED CLOTTING TIME (ISTAT)
~~LOC~~ BKR POCT ACT ISTAT: 273 s
~~LOC~~ BKR POCT ACT ISTAT: 291 s

## 2023-06-04 LAB — ECG 12-LEAD
P AXIS: -75 degrees
QTC CALCULATION (BAZETT): 478 ms (ref >60)
R AXIS: -65 degrees
T AXIS: 129 degrees

## 2023-06-04 LAB — CBC
~~LOC~~ BKR HEMATOCRIT: 42 % — ABNORMAL LOW (ref 40.0–50.0)
~~LOC~~ BKR MCH: 31 pg — ABNORMAL HIGH (ref 26.0–34.0)
~~LOC~~ BKR MCHC: 33 g/dL (ref 32.0–36.0)
~~LOC~~ BKR MCV: 92 fL — ABNORMAL HIGH (ref 80.0–100.0)
~~LOC~~ BKR MPV: 7.5 fL (ref 7.0–11.0)
~~LOC~~ BKR PLATELET COUNT: 161 10*3/uL (ref 150–400)
~~LOC~~ BKR RBC COUNT: 4.6 10*6/uL (ref 4.40–5.50)
~~LOC~~ BKR RDW: 13 % (ref 11.0–15.0)
~~LOC~~ BKR WBC COUNT: 12 10*3/uL — ABNORMAL HIGH (ref 4.50–11.00)

## 2023-06-04 LAB — POC GLUCOSE
~~LOC~~ BKR POC GLUCOSE: 156 mg/dL — ABNORMAL HIGH (ref 70–100)
~~LOC~~ BKR POC GLUCOSE: 165 mg/dL — ABNORMAL HIGH (ref 70–100)

## 2023-06-04 MED ORDER — PROPOFOL INJ 10 MG/ML IV VIAL
INTRAVENOUS | 0 refills | Status: DC
Start: 2023-06-04 — End: 2023-06-04

## 2023-06-04 MED ORDER — PANTOPRAZOLE 40 MG PO TBEC
40 mg | Freq: Every day | ORAL | 0 refills | Status: DC
Start: 2023-06-04 — End: 2023-06-05

## 2023-06-04 MED ORDER — ONDANSETRON HCL (PF) 4 MG/2 ML IJ SOLN
4 mg | INTRAVENOUS | 0 refills | Status: DC | PRN
Start: 2023-06-04 — End: 2023-06-05

## 2023-06-04 MED ORDER — SENNOSIDES-DOCUSATE SODIUM 8.6-50 MG PO TAB
2 | Freq: Two times a day (BID) | ORAL | 0 refills | Status: DC
Start: 2023-06-04 — End: 2023-06-05
  Administered 2023-06-05 (×2): 2 via ORAL

## 2023-06-04 MED ORDER — METHOCARBAMOL 100 MG/ML IJ SOLN
500 mg | Freq: Once | INTRAVENOUS | 0 refills | Status: CP
Start: 2023-06-04 — End: ?
  Administered 2023-06-04: 21:00:00 500 mg via INTRAVENOUS

## 2023-06-04 MED ORDER — DEXAMETHASONE SODIUM PHOSPHATE 4 MG/ML IJ SOLN
INTRAVENOUS | 0 refills | Status: DC
Start: 2023-06-04 — End: 2023-06-04

## 2023-06-04 MED ORDER — MULTIVITAMIN PO TAB
1 | ORAL | 0 refills | Status: DC
Start: 2023-06-04 — End: 2023-06-05
  Administered 2023-06-05: 15:00:00 1 via ORAL

## 2023-06-04 MED ORDER — FERROUS SULFATE 325 MG (65 MG IRON) PO TAB
325 mg | ORAL | 0 refills | Status: DC
Start: 2023-06-04 — End: 2023-06-05

## 2023-06-04 MED ORDER — INSULIN ASPART 100 UNIT/ML SC FLEXPEN
0-12 [IU] | Freq: Before meals | SUBCUTANEOUS | 0 refills | Status: DC
Start: 2023-06-04 — End: 2023-06-05

## 2023-06-04 MED ORDER — PROTAMINE 10 MG/ML IV SOLN
INTRAVENOUS | 0 refills | Status: DC
Start: 2023-06-04 — End: 2023-06-04

## 2023-06-04 MED ORDER — MAGNESIUM HYDROXIDE 400 MG/5 ML PO SUSP
30 mL | Freq: Every day | ORAL | 0 refills | Status: DC | PRN
Start: 2023-06-04 — End: 2023-06-05

## 2023-06-04 MED ORDER — PHENYLEPHRINE 80MCG/ML IN NS IV DRIP (STD CONC)
INTRAVENOUS | 0 refills | Status: DC
Start: 2023-06-04 — End: 2023-06-04
  Administered 2023-06-04: 18:00:00 .6 ug/kg/min via INTRAVENOUS
  Administered 2023-06-04: 18:00:00 .8 ug/kg/min via INTRAVENOUS
  Administered 2023-06-04: 18:00:00 .6 ug/kg/min via INTRAVENOUS
  Administered 2023-06-04: 18:00:00 .4 ug/kg/min via INTRAVENOUS
  Administered 2023-06-04: 18:00:00 .8 ug/kg/min via INTRAVENOUS
  Administered 2023-06-04: 18:00:00 .4 ug/kg/min via INTRAVENOUS

## 2023-06-04 MED ORDER — LEVOTHYROXINE 175 MCG PO TAB
175 ug | Freq: Every day | ORAL | 0 refills | Status: DC
Start: 2023-06-04 — End: 2023-06-05
  Administered 2023-06-05: 12:00:00 175 ug via ORAL

## 2023-06-04 MED ORDER — OXYCODONE 5 MG PO TAB
5 mg | ORAL | 0 refills | Status: DC | PRN
Start: 2023-06-04 — End: 2023-06-05

## 2023-06-04 MED ORDER — OXYCODONE 5 MG PO TAB
5 mg | Freq: Once | ORAL | 0 refills | Status: AC | PRN
Start: 2023-06-04 — End: ?

## 2023-06-04 MED ORDER — ONDANSETRON HCL (PF) 4 MG/2 ML IJ SOLN
4 mg | Freq: Once | INTRAVENOUS | 0 refills | Status: AC | PRN
Start: 2023-06-04 — End: ?

## 2023-06-04 MED ORDER — ARTIFICIAL TEARS (PF) SINGLE DOSE DROPS GROUP
OPHTHALMIC | 0 refills | Status: DC
Start: 2023-06-04 — End: 2023-06-04

## 2023-06-04 MED ORDER — MAGNESIUM SULFATE IN D5W 1 GRAM/100 ML IV PGBK
1 g | INTRAVENOUS | 0 refills | Status: DC | PRN
Start: 2023-06-04 — End: 2023-06-05

## 2023-06-04 MED ORDER — SODIUM CHLORIDE 0.9% IV SOLP
INTRAVENOUS | 0 refills | Status: DC
Start: 2023-06-04 — End: 2023-06-05

## 2023-06-04 MED ORDER — FENTANYL CITRATE (PF) 50 MCG/ML IJ SOLN
INTRAVENOUS | 0 refills | Status: DC
Start: 2023-06-04 — End: 2023-06-04

## 2023-06-04 MED ORDER — POTASSIUM CHLORIDE 20 MEQ/15 ML PO LIQD
20-40 meq | NASOGASTRIC | 0 refills | Status: DC | PRN
Start: 2023-06-04 — End: 2023-06-05

## 2023-06-04 MED ORDER — SUGAMMADEX 100 MG/ML IV SOLN
INTRAVENOUS | 0 refills | Status: DC
Start: 2023-06-04 — End: 2023-06-04

## 2023-06-04 MED ORDER — ALBUMIN, HUMAN 5 % IV SOLP
250 mL | Freq: Once | INTRAVENOUS | 0 refills | Status: CP
Start: 2023-06-04 — End: ?

## 2023-06-04 MED ORDER — ASPIRIN 81 MG PO TBEC
81 mg | Freq: Once | ORAL | 0 refills | Status: DC
Start: 2023-06-04 — End: 2023-06-04

## 2023-06-04 MED ORDER — PROCHLORPERAZINE 25 MG RE SUPP
25 mg | RECTAL | 0 refills | Status: DC | PRN
Start: 2023-06-04 — End: 2023-06-05

## 2023-06-04 MED ORDER — FENTANYL CITRATE (PF) 50 MCG/ML IJ SOLN
25 ug | INTRAVENOUS | 0 refills | Status: DC | PRN
Start: 2023-06-04 — End: 2023-06-05
  Administered 2023-06-04 (×2): 25 ug via INTRAVENOUS

## 2023-06-04 MED ORDER — POTASSIUM CHLORIDE 20 MEQ PO TBTQ
20-40 meq | ORAL | 0 refills | Status: DC | PRN
Start: 2023-06-04 — End: 2023-06-05
  Administered 2023-06-04: 21:00:00 20 meq via ORAL

## 2023-06-04 MED ORDER — MIDAZOLAM 1 MG/ML IJ SOLN
INTRAVENOUS | 0 refills | Status: DC
Start: 2023-06-04 — End: 2023-06-04

## 2023-06-04 MED ORDER — ACETAMINOPHEN 500 MG PO TAB
1000 mg | Freq: Once | ORAL | 0 refills | Status: CP | PRN
Start: 2023-06-04 — End: ?
  Administered 2023-06-04: 21:00:00 1000 mg via ORAL

## 2023-06-04 MED ORDER — ACETAMINOPHEN 325 MG PO TAB
650 mg | ORAL | 0 refills | Status: DC | PRN
Start: 2023-06-04 — End: 2023-06-05
  Administered 2023-06-05 (×2): 650 mg via ORAL

## 2023-06-04 MED ORDER — ONDANSETRON HCL (PF) 4 MG/2 ML IJ SOLN
INTRAVENOUS | 0 refills | Status: DC
Start: 2023-06-04 — End: 2023-06-04

## 2023-06-04 MED ORDER — MAGNESIUM OXIDE 400 MG (241.3 MG MAGNESIUM) PO TAB
200-600 mg | ORAL | 0 refills | Status: DC | PRN
Start: 2023-06-04 — End: 2023-06-05
  Administered 2023-06-04: 21:00:00 600 mg via ORAL

## 2023-06-04 MED ORDER — BISACODYL 10 MG RE SUPP
10 mg | Freq: Every day | RECTAL | 0 refills | Status: DC | PRN
Start: 2023-06-04 — End: 2023-06-05

## 2023-06-04 MED ORDER — HEPARIN (PORCINE) 1,000 UNIT/ML IJ SOLN
INTRAVENOUS | 0 refills | Status: DC
Start: 2023-06-04 — End: 2023-06-04

## 2023-06-04 MED ORDER — POTASSIUM CHLORIDE IN WATER 10 MEQ/50 ML IV PGBK
10 meq | INTRAVENOUS | 0 refills | Status: DC | PRN
Start: 2023-06-04 — End: 2023-06-05

## 2023-06-04 MED ORDER — ASPIRIN 81 MG PO CHEW
81 mg | Freq: Every day | ORAL | 0 refills | Status: DC
Start: 2023-06-04 — End: 2023-06-05
  Administered 2023-06-04 – 2023-06-05 (×2): 81 mg via ORAL

## 2023-06-04 MED ORDER — ROCURONIUM 10 MG/ML IV SOLN
INTRAVENOUS | 0 refills | Status: DC
Start: 2023-06-04 — End: 2023-06-04

## 2023-06-04 MED ORDER — LIDOCAINE (PF) 10 MG/ML (1 %) IJ SOLN
.2 mL | INTRAMUSCULAR | 0 refills | Status: DC | PRN
Start: 2023-06-04 — End: 2023-06-04

## 2023-06-04 MED ORDER — CEFAZOLIN 2 GRAM IV SOLR
2 g | INTRAVENOUS | 0 refills | Status: CP
Start: 2023-06-04 — End: ?
  Administered 2023-06-05 (×3): 2 g via INTRAVENOUS

## 2023-06-04 MED ORDER — EZETIMIBE 10 MG PO TAB
10 mg | Freq: Every day | ORAL | 0 refills | Status: DC
Start: 2023-06-04 — End: 2023-06-05
  Administered 2023-06-04 – 2023-06-05 (×2): 10 mg via ORAL

## 2023-06-04 MED ORDER — ROSUVASTATIN 20 MG PO TAB
20 mg | Freq: Every day | ORAL | 0 refills | Status: DC
Start: 2023-06-04 — End: 2023-06-05
  Administered 2023-06-05: 15:00:00 20 mg via ORAL

## 2023-06-04 MED ORDER — CEFAZOLIN 2 GRAM IV SOLR
INTRAVENOUS | 0 refills | Status: DC
Start: 2023-06-04 — End: 2023-06-04

## 2023-06-04 MED ORDER — EPINEPHRINE 0.1MG NS 10ML SYR (AM)(OR)
INTRAVENOUS | 0 refills | Status: DC
Start: 2023-06-04 — End: 2023-06-04
  Administered 2023-06-04 (×2): 20 ug via INTRAVENOUS

## 2023-06-04 MED ORDER — EUCALYPTUS-MENTHOL MM LOZG
1 | ORAL | 0 refills | Status: DC | PRN
Start: 2023-06-04 — End: 2023-06-05
  Administered 2023-06-05: 08:00:00 1 via ORAL

## 2023-06-04 MED ORDER — SODIUM CHLORIDE 0.9% IV SOLP
INTRAVENOUS | 0 refills | Status: DC
Start: 2023-06-04 — End: 2023-06-04
  Administered 2023-06-04: 16:00:00 1000.0000 mL via INTRAVENOUS

## 2023-06-04 MED ORDER — NITROGLYCERIN 200 MCG IN D5 SYRINGE (BATCHED)
INTRAVENOUS | 0 refills | Status: DC
Start: 2023-06-04 — End: 2023-06-04

## 2023-06-04 MED ORDER — HYDRALAZINE 20 MG/ML IJ SOLN
10 mg | INTRAVENOUS | 0 refills | Status: DC | PRN
Start: 2023-06-04 — End: 2023-06-05

## 2023-06-04 MED ADMIN — ALBUMIN, HUMAN 5 % IV SOLP [8982]: 250 mL | INTRAVENOUS | @ 21:00:00 | Stop: 2023-06-04 | NDC 68516521403

## 2023-06-04 NOTE — Progress Notes
CARDIOPULMONARY REHABILITATION  INPATIENT ASSESSMENT    Cardiac Rehabilitation Staff: Eulis Manly Discharge Date:     Demographics  Pre-admit Dx: Aortic Stenosis Date of Admission: 06/04/2023     Room: HC2 CVLAB RM/HC2 CVLAB BD DOB:  [birth year]   Insurance: Primary: [employer]  Secondary: None   Address: [address]  [place] [address]   Patient Phone:  [phone] (home)        ED Contact: Iman Hagner (wife)  ED Phone #: [phone]   CTS: Dr. Marlynn Perking Cardiologist: Dedra Skeens     Cardiac Procedures and Events     Valve: 06/04/23 (TAVR)        Risk Factors  Risk Factors: Hypertension, Hyperlipidemia, Obesity                  Medical History   has a past medical history of Abnormal stress test (09/26/2010), Atrial fibrillation (HCC) (05/13/2021), Cardiac pacemaker (05/16/2021), Chest pain (09/26/2010), COPD (chronic obstructive pulmonary disease) (HCC), Coronary artery disease (06/23/2009), Hypercholesterolemia (06/23/2009), Hypertension (06/23/2009), Hypothyroidism (06/23/2009), Presence of Watchman left atrial appendage closure device (02/21/2022), and Tests ordered (06/23/2009).    Labs  Cholesterol   Date Value Ref Range Status   03/27/2023 71 <200 MG/DL Final     Triglycerides   Date Value Ref Range Status   03/27/2023 124 <150 MG/DL Final     HDL   Date Value Ref Range Status   03/27/2023 42 >40 MG/DL Final     LDL   Date Value Ref Range Status   03/27/2023 21 <100 mg/dL Final     Hemoglobin M8U   Date Value Ref Range Status   03/26/2023 6.7 (H) 4.0 - 5.7 % Final     Comment:     The ADA recommends that most patients with type 1 and type 2 diabetes maintain   an A1c level <7%.       Troponin-I   Date Value Ref Range Status   01/20/2021 0.11 (H) 0.0 - 0.05 NG/ML Final         Heart Resource Manual Given:       Teaching Completed:       Outpatient Cardiopulmonary Rehabilitation    Outpatient Cardic Rehab:      Referral Faxed to:       Date Faxed:      Location:      [facility] [place], Sent to Staff: Eulis Manly, RN  06/04/2023

## 2023-06-04 NOTE — Progress Notes
Cardiothoracic Surgery Post-Op Note  Transcatheter Aortic Valve Replacement (TAVR)      NAME: Mason Lawson             MRN: [id number]                 DOB:09/06/1945          AGE: 77 y.o.  ADMISSION DATE: 06/04/2023             DAYS ADMITTED: LOS: 0 days    Primary Cardiologist: Dr. Arna Medici    Procedure: Procedure(s):  Transcatheter Aortic Valve Replacement - Femoral Artery    Post-Op Day #: 0    Assessment  Principal Problem:    S/p TAVR (transcatheter aortic valve replacement), bioprosthetic  Active Problems:    Hypercholesterolemia    Hypothyroidism    Hypertension    Nonrheumatic aortic valve stenosis    Chronic diastolic heart failure, NYHA class 2 (HCC)    Atrial flutter (HCC)    Dyslipidemia    Bradycardia    DM (diabetes mellitus) type II, controlled, with peripheral vascular disorder (HCC)    Atrial fibrillation (HCC)    Cardiac pacemaker    PAF (paroxysmal atrial fibrillation) (HCC)    Presence of Watchman left atrial appendage closure device    Coronary artery disease due to lipid rich plaque    Atherosclerosis of aorta (HCC)    Former smoker    Bilateral carotid artery stenosis    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 CV - Rhythm  AV Paced   Temporary pacemaker in place? No  Pacing required? Yes.  Pre-Op Device: PPM in Place, device interrogated post-op.  Reason for pre op device: Sinus Node Dysfunction.  SBP 110-120s. Cont ASA, ezetimibe, and rosuvastatin.  PTA cardiac meds ezetimibe and rosuvastatin- resumed, amlodipine, bisoprolol fumarate, losartan, hydralazine, and spironolactone- on hold.  Preop NT-Pro-BNP   NT-Pro-BNP   Date/Time Value Ref Range Status   05/30/2023 10:19 AM 535 (H) <450 pg/mL Final   .  Heart Failure? Yes, chronic diastolic heart failure NYHA Class 2. Intra-op EF report pending. Post Op Limited Echocardiogram ordered for tomorrow per protocol. Pulmonary HTN? No.  Atherosclerosis of aorta noted on Echocardiogram or CTA? Yes.  2. Resp - SpO2 97% on RA.  CXR reviewed: TAVR in place, no acute changes.    Cont IS, aggressive pulm toilet.  COPD: Yes-Mild: FEV1 60-75% predicted (new- not on PTA meds)   Lab Results   Component Value Date    FVCPRE 3.28 03/28/2023    FVCPREDPRE 81 03/28/2023    FEV1PRE 2.25 03/28/2023    FEV1PREDPRE 75 03/28/2023     3. Renal - Creatinine pending. Chronic kidney disease: No. Stage: Acute Kidney Injury? No.  I/O- will monitor. Weight:   Intake/Output Summary (Last 24 hours) at 06/04/2023 1414  Last data filed at 06/04/2023 1339  Gross per 24 hour   Intake 800 ml   Output 1 ml   Net 799 ml       Vitals:    06/04/23 0837   Weight: 96.7 kg (213 lb 3 oz)   .   Hyponatremia - Pending., Hypokalemia- Pending., Hypomagnesemia: Pending.Marland KitchenHypocalcemia?  pending. PTA diuretic/electrolytes furosemide and spironolactone- on hold. PTA BPH meds: None.   Lab Results   Component Value Date/Time    BUN 21 05/30/2023 10:19 AM    CR 0.87 05/30/2023 10:19 AM  GLU 130 (H) 05/30/2023 10:19 AM      Lab Results   Component Value Date/Time    NA 139 05/30/2023 10:19 AM    K 4.3 05/30/2023 10:19 AM    CA 9.3 05/30/2023 10:19 AM    CL 104 05/30/2023 10:19 AM    CO2 24 05/30/2023 10:19 AM    GAP 11 05/30/2023 10:19 AM     4. GI - Diet: Cardiac/Diabetic.  Resumed PTA PPI. BM post-op: No, cont bowel regimen. Chronic liver disease? No. Type: No  5. Heme/ID - WBC pending. Afebrile. Acute blood loss anemia? pending.  PTA Ferrous sulfate resumed. Thrombocytopenia: pending. Coagulopathy? No, due to:  No. Antiplatelet?: No Indication:none  Anticoagulation?: No Indication: none  Lab Results   Component Value Date    HGB 15.3 05/30/2023    WBC 9.00 05/30/2023    PLTCT 183 05/30/2023      6. Endo - Diabetes: Yes  HgA1c: 6.7. MDCF ordered. PTA meds:metformin - on hold.   7. Activity - OOBTC and ambulate with nursing and Cardiac rehab at least TID.  Anticipated discharge dispo: Home.  Chronic fatigue? No. Age related physical debility? No.  8. Neuro/Psych - pain well controlled.  PTA meds: None.  9. Disposition - Monitor Rhythm and hemodynamics. Echocardiogram pending. Needs to ambulate, d/w staff    Total Time Today was 55 minutes in the following activities: Preparing to see the patient, Obtaining and/or reviewing separately obtained history, Performing a medically appropriate examination and/or evaluation, Counseling and educating the patient/family/caregiver, Ordering medications, tests, or procedures, Referring and communication with other health care professionals (when not separately reported), Documenting clinical information in the electronic or other health record, Independently interpreting results (not separately reported) and communicating results to the patient/family/caregiver, and Care coordination (not separately reported)      Donita Brooks, APRN-NP  Cardiothoracic Surgery  Reach me on Voalte or Page [phone]  at 06/04/2023 2:14 PM    Subjective  Mason Lawson is a 77 y.o. male who has a history of symptomatic valvular heart disease.  He is now s/p Transcatheter Aortic Valve Replacement (TAVR).    History of Present Illness  Mason Lawson is s/p TAVR.  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  [name] on September 23 and had complaints of chest discomfort therefore he was admitted directly to the hospital for further evaluation.  He had an echocardiogram which revealed an EF of 60 to 65%.  He grade 2 diastolic dysfunction.  There was severe aortic stenosis and mild regurgitation.  The mean gradient was 40 mmHg with a velocity of 4.3 m/s.  There was mild mitral regurgitation and moderate mitral annular calcification.  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  [name] and was noted to have a patent LIMA to the LAD, patent RIMA Y graft to the OM and patent radial to the PDA with a patent proximal stent.  The valve team was consulted and felt he was a candidate for TAVR.  [name].  Carotid duplex showed 70% right carotid artery stenosis.  [name] who ordered a CTA neck.  This showed an 80% proximal right ICA stenosis but because he was asymptomatic medical management was recommended.  He  is scheduled for TAVR on December 2.     Mason Lawson has been stable since discharge.  He continues to have mild shortness of breath and some orthopnea.  He also continues to have some chest discomfort. He states overall his edema has improved.  He denies palpitations, near-syncope or syncope.    Review of Systems  Cardiovascular: denies chest pain, palpitations, shortness of breath  Respiratory: denies cough, wheezing, sputum      Objective  Vitals:    06/04/23 0832 06/04/23 0837   BP: (!) 142/72 126/65   BP Source: Arm, Right Upper Arm, Left Upper   Pulse: 78 61   Temp:  36.8 ?C (98.2 ?F)   SpO2: 93% 94%   O2 Device: None (Room air) None (Room air)   Weight:  96.7 kg (213 lb 3 oz)   Height:  181.6 cm (5' 11.5)       Physical Exam  Cardiovascular: RRR Without murmur  Lungs:  Diminished bilateral bases  GI: Soft, non-tender, +bowel sounds  Extremities: no edema  Groins/Incision site: hematoma:No, Location: Right Groin and Left Groin groin, soft    Intake/Output Summary (Last 24 hours) at 06/04/2023 1414  Last data filed at 06/04/2023 1339  Gross per 24 hour   Intake 800 ml   Output 1 ml   Net 799 ml      Pertinent Meds:           Taking      Reason for Not Taking  1. Aspirin Yes N/a   2. B-Blocker No Reassess POD1   3. Statin Yes N/a   4. ACE/ARB No Reassess      Prophylaxis Review:  Lines:  Yes; Arterial Line; Indication:  Continuous BP monitoring; Location:  Radial  Antibiotic Usage:  Yes; surgical prophylaxis.   VTE: Mechanical prophylaxis; Sequential compression device  Urinary Catheter: No    Basic Metabolic Profile    Lab Results   Component Value Date/Time    NA 139 05/30/2023 10:19 AM    K 4.3 05/30/2023 10:19 AM    CA 9.3 05/30/2023 10:19 AM    CL 104 05/30/2023 10:19 AM    CO2 24 05/30/2023 10:19 AM    GAP 11 05/30/2023 10:19 AM    Lab Results   Component Value Date/Time    BUN 21 05/30/2023 10:19 AM    CR 0.87 05/30/2023 10:19 AM    GLU 130 (H) 05/30/2023 10:19 AM        CBC w/Diff    Lab Results   Component Value Date/Time    WBC 9.00 05/30/2023 10:06 AM    RBC 4.91 05/30/2023 10:06 AM    HGB 15.3 05/30/2023 10:06 AM    HCT 45.7 05/30/2023 10:06 AM    MCV 93.1 05/30/2023 10:06 AM    MCH 31.1 05/30/2023 10:06 AM    MCHC 33.4 05/30/2023 10:06 AM    RDW 13.4 05/30/2023 10:06 AM    PLTCT 183 05/30/2023 10:06 AM    MPV 7.8 05/30/2023 10:06 AM    Lab Results   Component Value Date/Time    NEUT 53 03/28/2023 03:57 AM    ANC 5.70 03/28/2023 03:57 AM    LYMA 29 03/28/2023 03:57 AM    ALC 3.00 03/28/2023 03:57 AM    MONA 13 (H) 03/28/2023 03:57 AM    AMC 1.30 (H) 03/28/2023 03:57 AM    EOSA 4 03/28/2023 03:57 AM    AEC 0.40 03/28/2023 03:57 AM    BASA 1  03/28/2023 03:57 AM    ABC 0.10 03/28/2023 03:57 AM

## 2023-06-04 NOTE — Progress Notes
Patient arrived to [facility] from [facility].    Neuro: Alert and oriented x4. Follows commands in all extremities. Pain 2/10. No abnomal sensation reported.    CV: A/V paced at 60. [employer] PPM interrogated today.    Resp: Clear lung sounds. 3LNC.    Groin site WDL.

## 2023-06-04 NOTE — Unmapped
Brief Operative Note    Name: Mason Lawson is a 77 y.o. male     DOB: Jul 05, 1945             MRN#: [id number]  DATE OF OPERATION: 06/04/2023    Date:  06/04/2023        Preoperative Dx:   Nonrheumatic aortic valve stenosis [I35.0]    Post-op Diagnosis      * Nonrheumatic aortic valve stenosis [I35.0]    Procedure(s):  Transcatheter Aortic Valve Replacement - Femoral Artery    Surgeons and Role:     * Zella Richer, MD - Primary     * Wynema Birch, MD - Fellow     * Hajj, Alfredo Martinez, MD - Co-Surgeon      Findings:  Gradient 35 mmHg by echo     Estimated Blood Loss: No blood loss documented.     Specimen(s) Removed/Disposition: * No specimens in log *    Complications:  None      Implants:   Implant Name Serial No. Manufacturer Lot No. LRB No. Used Action   [id number] Ultra [address] Cmnd Sys Edwards 29mm KIT - [id number] [employer] N/A N/A 1 Implanted       Drains: Details on drains available on LDA report    Disposition:  PACU - stable    Zella Richer, MD  Pager [phone]

## 2023-06-04 NOTE — Interval H&P Note
History and Physical Update Note    Allergies:  Cefdinir, Oxycodone, and Percodan [oxycodone hcl-oxycodone-asa]    Lab/Radiology/Other Diagnostic Tests:  Hematology:    Lab Results   Component Value Date    HGB 15.3 05/30/2023    HGB 15.6 03/28/2023    HCT 45.7 05/30/2023    HCT 45.4 03/28/2023    PLTCT 183 05/30/2023    PLTCT 173 03/28/2023    WBC 9.00 05/30/2023    WBC 10.5 03/28/2023    NEUT 53 03/28/2023    ANC 5.70 03/28/2023    ALC 3.00 03/28/2023    MONA 13 03/28/2023    AMC 1.30 03/28/2023    EOSA 4 03/28/2023    ABC 0.10 03/28/2023    MCV 93.1 05/30/2023    MCV 92.6 03/28/2023    MCH 31.1 05/30/2023    MCH 31.9 03/28/2023    MCHC 33.4 05/30/2023    MCHC 34.4 03/28/2023    MPV 7.8 05/30/2023    MPV 8.9 03/28/2023    RDW 13.4 05/30/2023    RDW 13.5 03/28/2023   , Basic Chemistry:   Lab Results   Component Value Date    NA 139 05/30/2023    NA 136 03/28/2023    K 4.3 05/30/2023    K 4.8 03/28/2023    CL 104 05/30/2023    CL 103 03/28/2023    CO2 24 05/30/2023    CO2 23 03/28/2023    BUN 21 05/30/2023    BUN 18 03/28/2023    CR 0.87 05/30/2023    CR 0.78 03/28/2023    GLU 130 05/30/2023    GLU 110 03/28/2023    GLU 121 07/04/2004    CA 9.3 05/30/2023    CA 9.4 03/28/2023   , Mg and PO4:   Lab Results   Component Value Date    MG 2.1 03/28/2023    PO4 4.2 03/28/2023   , HgbA1C:   Lab Results   Component Value Date    HGBA1C 6.7 03/26/2023     Point of Care Testing:  (Last 24 hours):  POC Glucose (Download): (!) 156 (06/04/23 0839)     Nutrition: No Dietitian Consult  Wound: No Wound Consult      I have examined the patient, and there are no significant changes in their condition, from the previous H&P performed on 05/30/23. Patient to undergo transcatheter aortic valve replacement today with Dr. Ky Barban and Dr. Argie Ramming as planned. Patients states that Dr. Ky Barban and Hajj explained the procedure and risk to patients satisfaction. Pertinent preoperative testing including history, labs and imaging reviewed. Patient denies any new symptoms or recent exposure to COVID-19. Patient understands procedure and all questions were answered. Surgical/Blood consents are signed and in chart. NPO since midnight. Pre-Surgical scrubs complete.       Donita Brooks, APRN-NP  Available on Voalte or Pager [phone].       --------------------------------------------------------------------------------------------------------------------------------------------

## 2023-06-05 ENCOUNTER — Inpatient Hospital Stay: Admit: 2023-06-05 | Discharge: 2023-06-05 | Payer: MEDICARE

## 2023-06-05 ENCOUNTER — Encounter: Admit: 2023-06-05 | Discharge: 2023-06-05 | Payer: MEDICARE

## 2023-06-05 DIAGNOSIS — E1151 Type 2 diabetes mellitus with diabetic peripheral angiopathy without gangrene: Secondary | ICD-10-CM

## 2023-06-05 DIAGNOSIS — I495 Sick sinus syndrome: Secondary | ICD-10-CM

## 2023-06-05 DIAGNOSIS — E039 Hypothyroidism, unspecified: Secondary | ICD-10-CM

## 2023-06-05 DIAGNOSIS — Z79899 Other long term (current) drug therapy: Secondary | ICD-10-CM

## 2023-06-05 DIAGNOSIS — J449 Chronic obstructive pulmonary disease, unspecified: Secondary | ICD-10-CM

## 2023-06-05 DIAGNOSIS — I480 Paroxysmal atrial fibrillation: Secondary | ICD-10-CM

## 2023-06-05 DIAGNOSIS — Z951 Presence of aortocoronary bypass graft: Secondary | ICD-10-CM

## 2023-06-05 DIAGNOSIS — I700 Atherosclerosis of aorta: Secondary | ICD-10-CM

## 2023-06-05 DIAGNOSIS — Z885 Allergy status to narcotic agent status: Secondary | ICD-10-CM

## 2023-06-05 DIAGNOSIS — I5032 Chronic diastolic (congestive) heart failure: Secondary | ICD-10-CM

## 2023-06-05 DIAGNOSIS — Z950 Presence of cardiac pacemaker: Secondary | ICD-10-CM

## 2023-06-05 DIAGNOSIS — Z7984 Long term (current) use of oral hypoglycemic drugs: Secondary | ICD-10-CM

## 2023-06-05 DIAGNOSIS — I340 Nonrheumatic mitral (valve) insufficiency: Secondary | ICD-10-CM

## 2023-06-05 DIAGNOSIS — I2583 Coronary atherosclerosis due to lipid rich plaque: Secondary | ICD-10-CM

## 2023-06-05 DIAGNOSIS — Z7982 Long term (current) use of aspirin: Secondary | ICD-10-CM

## 2023-06-05 DIAGNOSIS — I3481 Nonrheumatic mitral (valve) annulus calcification: Secondary | ICD-10-CM

## 2023-06-05 DIAGNOSIS — Z87891 Personal history of nicotine dependence: Secondary | ICD-10-CM

## 2023-06-05 DIAGNOSIS — Z881 Allergy status to other antibiotic agents status: Secondary | ICD-10-CM

## 2023-06-05 DIAGNOSIS — E785 Hyperlipidemia, unspecified: Secondary | ICD-10-CM

## 2023-06-05 DIAGNOSIS — I6523 Occlusion and stenosis of bilateral carotid arteries: Secondary | ICD-10-CM

## 2023-06-05 DIAGNOSIS — Z79890 Hormone replacement therapy (postmenopausal): Secondary | ICD-10-CM

## 2023-06-05 DIAGNOSIS — I110 Hypertensive heart disease with heart failure: Secondary | ICD-10-CM

## 2023-06-05 LAB — LIMITED ECHO
AO AT: 97 ms
AORTIC VALVE STROKE VOLUME INDEX: 45
ASCENDING AORTA: 3.6 cm
AV INDEX (NATIVE): 0.5
AV MEAN GRADIENT: 5.5 mmHg
AV PEAK GRADIENT: 11 mmHg
AV PEAK VELOCITY: 1.7 m/s
AV VALVE AREA: 2.8 cm2
BSA: 2.2 m2
DOP CALC AO VTI: 34 cm
DOP CALC LVOT AREA: 4.9 cm2
DOP CALC LVOT DIAMETER: 2.5 cm
DOP CALC LVOT PEAK VEL VTI: 20 cm
DOP CALC LVOT PEAK VEL: 0.9 m/s
DOP CALC LVOT STROKE VOLUME: 99 cm3
DOP CALC MV VTI: 54 cm — ABNORMAL HIGH (ref 70–100)
ECHO EF: 60 %
INTERVENTRICULAR SEPTUM: 1.4 cm (ref 0.6–1)
LEFT ATRIUM INDEX: 42 mL/m2 (ref 16–34)
LEFT ATRIUM VOLUME: 94 mL (ref 18–58)
LEFT VENTRICLE MASS INDEX: 119 g/m2 (ref 49–115)
LEFT VENTRICLE SYSTOLIC VOLUME INDEX: 56 mL/m2 (ref 11–31)
LEFT VENTRICLE SYSTOLIC VOLUME: 124 mL (ref 21–61)
LEFT VENTRICULAR INTERNAL DIMENSION IN DIASTOLE: 4.7 cm (ref 4.2–5.8)
LEFT VENTRICULAR MASS: 265 g (ref 88–224)
MV MEAN GRADIENT: 4 mmHg — ABNORMAL HIGH (ref 70–100)
MV STENOSIS PRESSURE HALF TIME: 127 ms
MV VALVE AREA BY CONTINUITY EQUATION: 1.8 cm2
MV VALVE AREA P 1/2 METHOD: 1.7 cm2
POSTERIOR WALL: 1.4 cm (ref 0.6–1)
RA PRESSURE: 8
RELATIVE WALL THICKNESS: 0.6 (ref <=0.42)
RIGHT ATRIAL AREA: 22 cm2 (ref <18)
RIGHT HEART SYSTOLIC MMODE TAPSE: 1.6 cm (ref >1.7)
SIMPSONS BIPLANE EF: 57 %

## 2023-06-05 LAB — POC GLUCOSE
~~LOC~~ BKR POC GLUCOSE: 129 mg/dL — ABNORMAL HIGH (ref 70–100)
~~LOC~~ BKR POC GLUCOSE: 171 mg/dL — ABNORMAL HIGH (ref 70–100)

## 2023-06-05 MED ORDER — PERFLUTREN LIPID MICROSPHERES 1.1 MG/ML IV SUSP
1-10 mL | Freq: Once | INTRAVENOUS | 0 refills | Status: CP | PRN
Start: 2023-06-05 — End: ?
  Administered 2023-06-05: 15:00:00 3 mL via INTRAVENOUS

## 2023-06-05 MED ORDER — METFORMIN 500 MG PO TAB
500 mg | ORAL_TABLET | Freq: Two times a day (BID) | ORAL | 3 refills | Status: CN
Start: 2023-06-05 — End: ?

## 2023-06-05 MED ORDER — BISOPROLOL FUMARATE 5 MG PO TAB
2.5 mg | Freq: Every day | ORAL | 0 refills | Status: DC
Start: 2023-06-05 — End: 2023-06-05
  Administered 2023-06-05: 15:00:00 2.5 mg via ORAL

## 2023-06-05 MED ORDER — LOSARTAN 100 MG PO TAB
50 mg | ORAL_TABLET | Freq: Every day | ORAL | 3 refills | Status: CN
Start: 2023-06-05 — End: ?

## 2023-06-05 MED ORDER — TRAMADOL 50 MG PO TAB
50 mg | ORAL_TABLET | ORAL | 0 refills | Status: CN | PRN
Start: 2023-06-05 — End: ?

## 2023-06-05 MED ORDER — LOSARTAN 50 MG PO TAB
50 mg | Freq: Every day | ORAL | 0 refills | Status: DC
Start: 2023-06-05 — End: 2023-06-05
  Administered 2023-06-05: 18:00:00 50 mg via ORAL

## 2023-06-05 MED ORDER — FUROSEMIDE 40 MG PO TAB
40 mg | Freq: Every day | ORAL | 0 refills | Status: DC
Start: 2023-06-05 — End: 2023-06-05
  Administered 2023-06-05: 15:00:00 40 mg via ORAL

## 2023-06-05 MED ORDER — POTASSIUM CHLORIDE 20 MEQ PO TBTQ
20 meq | ORAL_TABLET | Freq: Every day | ORAL | 0 refills | 30.00000 days | Status: AC
Start: 2023-06-05 — End: ?
  Filled 2023-06-05: qty 90, 90d supply, fill #1

## 2023-06-05 MED ORDER — SENNOSIDES-DOCUSATE SODIUM 8.6-50 MG PO TAB
2 | Freq: Two times a day (BID) | ORAL | 0 refills | Status: AC
Start: 2023-06-05 — End: ?

## 2023-06-05 MED ORDER — TRAMADOL 50 MG PO TAB
50 mg | ORAL | 0 refills | Status: DC | PRN
Start: 2023-06-05 — End: 2023-06-05
  Administered 2023-06-05: 16:00:00 50 mg via ORAL

## 2023-06-05 MED ORDER — ACETAMINOPHEN 325 MG PO TAB
650 mg | ORAL | 0 refills | Status: AC | PRN
Start: 2023-06-05 — End: ?

## 2023-06-05 MED ORDER — SODIUM CHLORIDE 0.9 % IJ SOLN
10 mL | Freq: Once | INTRAVENOUS | 0 refills | Status: CP
Start: 2023-06-05 — End: ?
  Administered 2023-06-05: 15:00:00 10 mL via INTRAVENOUS

## 2023-06-05 MED ORDER — REPATHA SURECLICK 140 MG/ML SC PNIJ
140 mg | SUBCUTANEOUS | 11 refills | 28.00000 days | Status: AC
Start: 2023-06-05 — End: ?
  Filled 2023-06-14: qty 2, 28d supply, fill #1

## 2023-06-05 MED ADMIN — WATER FOR INJECTION, STERILE IJ SOLN [79513]: 20 mL | INTRAVENOUS | @ 18:00:00 | Stop: 2023-06-05 | NDC 63323018508

## 2023-06-05 NOTE — Progress Notes
RT Adult Assessment Note    NAME:Mason Lawson             MRN: [id number]             DOB:08-15-1945          AGE: 77 y.o.  ADMISSION DATE: 06/04/2023             DAYS ADMITTED: LOS: 0 days    Additional Comments:  Impressions of the patient: no distress noted      Vital Signs:  Pulse: 61  RR: 13 PER MINUTE  SpO2: 97 %  O2 Device: Nasal cannula  Liter Flow: 3 Lpm  O2%:      Breath Sounds:   Breath Sounds WDL: Within Defined Limits  Respiratory Effort:   Respiratory WDL: Within Defined Limits  Comments: refusing cpap, pox

## 2023-06-05 NOTE — Progress Notes
DC orders complete. Pt prepared for DC. IV and tele pack removed. Pt actively participated in DC plan of care. Pt states adequate knowledge regarding DC Plan of care. Dermal wound cleanser supplied to pt. Home wound care instructions complete and pt states understanding. Pt has BP cuff at home and states appropriate reportable parameters. All other education complete. Pt states appropriate signs and symptoms to report.

## 2023-06-05 NOTE — Progress Notes
06/05/23 0900   Education   Person Instructed Patient   Patient Barriers To Learning None Noted   Interventions/Teaching Methods Written Materials Provided;Verbal Instructions   Patient Response Verbalized Understanding   Topics Wound Care;Reinforcement of Medications - Anti-Platelet;Reinforcement of Medications - Anti-Coagulant;Reasons to call your doctor;Risk Factor Management - Blood Pressure;Risk Factor Management - Obesity;Risk Factor Management - Cholesterol;Risk Factor Management - Physical Inactivity;Home Walking Guidelines;Outpatient Cardiac Rehab Referral Information   Teaching Completed 06/05/23   Heart Resource Manual Given 06/05/23   Comments Pt was given cardiac rehab and discharge information. Pt declined services d/t swimming daily for 30 minutes. Discussed the recommendation of no submersion of incision site for up to 6 weeks. Pt stated he would complete other exercises while waiting clearance. All questions answered at this time.   Outpatient Cardiopulmonary Rehab   OPCR No   Why? Other  (Pt exercises at home. Not interested in program.)

## 2023-06-05 NOTE — Case Management (ED)
Primary Case Management Team:   Social Work: Shanda Bumps, Phone [phone] (or Amie Critchley)  Nurse Case Manager: Roslyn Smiling  After-Hours: Please dial hospital operator and request On-Call Case Manager    Initial Huddle Date or Communication with Physician Team: 06/05/2023  Expected Discharge Date: 06/05/2023 2:00 PM  Is Patient Medically Stable: Yes   -------------------------------------------------------------------------------------------------------------------------    Patient was admitted for aortic stenosis and is currently POD # 1 TAVR.    Using sources of EMR, RN staff, and/or huddle discussion and the criteria below, patient was screened for potential discharge planning needs:    Team anticipates post-acute care needs that will exceed patient/caregiver capacity to arrange or manage independently: No  If Yes, full CM assessment is required.    Prior to this episode of care (including at outside hospital), patient's residence was: Family home    Patient is alert and oriented, and likely to remain that way at discharge: Yes   If No, patient  (a) Is at cognitive baseline, (b) has an established caregiver, and (c) home routine is unlikely to change at discharge: N/A    If any answer to (a), (b), or (c) is No, full assessment is required.    Patient is independent in functioning, and likely to remain that way at discharge: Yes   If No, patient   (a) Is at functional baseline, (b) has an established caregiver or adequate assistance, and (c) home routine is unlikely to change at discharge: N/A  If any answer to (a), (b), or (c) is No, full assessment is required.    PSYCHOSOCIAL NEEDS:  If the patient has unstable psychosocial needs and has not had full CM assessment in the past 30 days, full assessment is required.    CM spoke with or attempted to speak with patient today to determine willingness to address behavioral/social/domestic need: N/A  Outcomes of intervention:   The patient is open to addressing these needs during this admission: N/A.   Further results were: N/A.    Other Case Management Notes:     Per primary team huddle, pt is s/p 1 TAVR.  Pt is doing well with nae overnight.  Echo today.  Medically stable for dc today.      Plan: Likely discharge to home without Case Management needs. Case Management Team will continue to monitor daily for development of needs. Should Case Management needs arise, please call or Voalte Case Management Team noted above. If after hours or on the weekend, please dial hospital operator and request the on-call Case Manager.    Gweneth Dimitri, LMSW  Surgery - Cardiothoracic Surgery   Social Work Case Manager  *[phone]

## 2023-06-05 NOTE — Progress Notes
Cardiothoracic Surgery Post-Op Note  Transcatheter Aortic Valve Replacement (TAVR)      NAME: Mason Lawson             MRN: [id number]                 DOB:August 22, 1945          AGE: 77 y.o.  ADMISSION DATE: 06/04/2023             DAYS ADMITTED: LOS: 1 day    Primary Cardiologist: Dr. Arna Medici    Procedure: Procedure(s):  Transcatheter Aortic Valve Replacement - Axillary Artery    Post-Op Day #: 1    Assessment  Principal Problem:    S/p TAVR (transcatheter aortic valve replacement), bioprosthetic  Active Problems:    Hypercholesterolemia    Hypothyroidism    Hypertension    Chronic diastolic heart failure, NYHA class 2 (HCC)    Atrial flutter (HCC)    Dyslipidemia    Bradycardia    DM (diabetes mellitus) type II, controlled, with peripheral vascular disorder (HCC)    Atrial fibrillation (HCC)    Cardiac pacemaker    PAF (paroxysmal atrial fibrillation) (HCC)    Presence of Watchman left atrial appendage closure device    Coronary artery disease due to lipid rich plaque    Atherosclerosis of aorta (HCC)    Former smoker    Bilateral carotid artery stenosis    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 CV - Rhythm  AV Paced   Temporary pacemaker in place? No  Pacing required? Yes.  Pre-Op Device: PPM in Place, device interrogated post-op.  Reason for pre op device: Sinus Node Dysfunction.  SBP 108-152s. Cont ASA, ezetimibe, rosuvastatin, and will restart bisoprolol fumarate.  PTA cardiac meds ezetimibe and rosuvastatin- resumed, amlodipine, bisoprolol fumarate, losartan, hydralazine, and spironolactone- on hold.  Preop NT-Pro-BNP   NT-Pro-BNP   Date/Time Value Ref Range Status   05/30/2023 10:19 AM 535 (H) <450 pg/mL Final   .  Heart Failure? Yes, chronic diastolic heart failure NYHA Class 2. Intra-op EF 45-50%. Post Op Limited Echocardiogram ordered for today per protocol. Pulmonary HTN? No.  Atherosclerosis of aorta noted on Echocardiogram or CTA? Yes.  2. Resp - SpO2 95-99% on RA.  CXR reviewed: TAVR in place, pulmonary congestion, no acute changes, will await final radiology report.    Cont IS, aggressive pulm toilet.  COPD: Yes-Mild: FEV1 60-75% predicted (new- not on PTA meds)   Lab Results   Component Value Date    FVCPRE 3.28 03/28/2023    FVCPREDPRE 81 03/28/2023    FEV1PRE 2.25 03/28/2023    FEV1PREDPRE 75 03/28/2023     3. Renal - Creatinine 0.66. Chronic kidney disease: No. Stage: Acute Kidney Injury? No.  I/O- will monitor. Weight:   Intake/Output Summary (Last 24 hours) at 06/05/2023 0915  Last data filed at 06/05/2023 0400  Gross per 24 hour   Intake 1500 ml   Output 1451 ml   Net 49 ml       Vitals:    06/05/23 0721   Weight: 97.7 kg (215 lb 6.2 oz)   .   Hyponatremia - No., Hypokalemia- Yes-Replace scale, recheck., Hypomagnesemia: Yes-Replace.Hypocalcemia?  pending. PTA diuretic/electrolytes furosemide- will resumed and spironolactone- on hold. PTA BPH meds: None.   Lab Results   Component Value Date/Time    BUN 19 06/05/2023 03:45 AM  CR 0.66 06/05/2023 03:45 AM    GLU 132 (H) 06/05/2023 03:45 AM      Lab Results   Component Value Date/Time    NA 138 06/05/2023 03:45 AM    K 4.2 06/05/2023 03:45 AM    CA 8.7 06/05/2023 03:45 AM    CL 105 06/05/2023 03:45 AM    CO2 23 06/05/2023 03:45 AM    GAP 11 06/05/2023 03:45 AM     4. GI - Diet: Cardiac/Diabetic.  Resumed PTA PPI. BM post-op: No, cont bowel regimen. Chronic liver disease? No. Type: No  5. Heme/ID - WBC pending. Afebrile. Acute blood loss anemia? pending.  PTA Ferrous sulfate resumed. Thrombocytopenia: pending. Coagulopathy? No, due to:  No. Antiplatelet?: No Indication:none  Anticoagulation?: No Indication: none  Lab Results   Component Value Date    HGB 14.7 06/05/2023    WBC 13.00 (H) 06/05/2023    PLTCT 148 (L) 06/05/2023      6. Endo - Diabetes: Yes  HgA1c: 6.7. MDCF ordered. PTA meds:metformin - on hold.   7. Activity - OOBTC and ambulate with nursing and Cardiac rehab at least TID.  Anticipated discharge dispo: Home.  Chronic fatigue? No. Age related physical debility? No.  8. Neuro/Psych - pain well controlled.  PTA meds: None.  9. Disposition - Monitor Rhythm and hemodynamics- DC [facility]. Echocardiogram pending. Needs to ambulate, Discharge home today, d/w staff    Total Time Today was 55 minutes in the following activities: Preparing to see the patient, Obtaining and/or reviewing separately obtained history, Performing a medically appropriate examination and/or evaluation, Counseling and educating the patient/family/caregiver, Ordering medications, tests, or procedures, Referring and communication with other health care professionals (when not separately reported), Documenting clinical information in the electronic or other health record, Independently interpreting results (not separately reported) and communicating results to the patient/family/caregiver, and Care coordination (not separately reported)      Donita Brooks, APRN-NP  Cardiothoracic Surgery  Reach me on Voalte or Page [phone]  at 06/05/2023 9:15 AM    Subjective  Mason Lawson is a 77 y.o. male who has a history of symptomatic valvular heart disease.  He is now s/p Transcatheter Aortic Valve Replacement (TAVR).    History of Present Illness  Mason Lawson is s/p TAVR.  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  [name] on September 23 and had complaints of chest discomfort therefore he was admitted directly to the hospital for further evaluation.  He had an echocardiogram which revealed an EF of 60 to 65%.  He grade 2 diastolic dysfunction.  There was severe aortic stenosis and mild regurgitation.  The mean gradient was 40 mmHg with a velocity of 4.3 m/s.  There was mild mitral regurgitation and moderate mitral annular calcification.  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  [name] and was noted to have a patent LIMA to the LAD, patent RIMA Y graft to the OM and patent radial to the PDA with a patent proximal stent.  The valve team was consulted and felt he was a candidate for TAVR.  [name].  Carotid duplex showed 70% right carotid artery stenosis.  [name] who ordered a CTA neck.  This showed an 80%  proximal right ICA stenosis but because he was asymptomatic medical management was recommended.  He is scheduled for TAVR on December 2.     Mason Lawson has been stable since discharge.  He continues to have mild shortness of breath and some orthopnea.  He also continues to have some chest discomfort. He states overall his edema has improved.  He denies palpitations, near-syncope or syncope.    Review of Systems  Cardiovascular: denies chest pain, palpitations, shortness of breath  Respiratory: denies cough, wheezing, sputum    Objective  Vitals:    06/05/23 0200 06/05/23 0347 06/05/23 0400 06/05/23 0721   BP: (!) 146/66 (!) 152/62  (!) 142/67  Comment: RN notified   BP Source:  Arm, Left Upper     Pulse: 60 64  70   Temp:  36.4 ?C (97.5 ?F)  37 ?C (98.6 ?F)   SpO2: 95% 98%  93%   O2 Device: Nasal cannula      O2 Liter Flow: 3 Lpm      Weight:   97.7 kg (215 lb 6.4 oz) 97.7 kg (215 lb 6.2 oz)   Height:    181.6 cm (5' 11.5)     Physical Exam  Cardiovascular: RRR Without murmur  Lungs:  Diminished bilateral bases  GI: Soft, non-tender, +bowel sounds  Extremities: no edema  Groins/Incision site: hematoma:No, Location: Right Groin and Left Groin groin, soft    Intake/Output Summary (Last 24 hours) at 06/05/2023 0915  Last data filed at 06/05/2023 0400  Gross per 24 hour   Intake 1500 ml   Output 1451 ml   Net 49 ml      Pertinent Meds:           Taking      Reason for Not Taking  1. Aspirin Yes N/a   2. B-Blocker Yes N/a   3. Statin Yes N/a 4. ACE/ARB No Reassess      Prophylaxis Review:  Lines:  Yes; Arterial Line; Indication:  Continuous BP monitoring; Location:  Radial- will DC.   Antibiotic Usage:  Yes; surgical prophylaxis.   VTE: Mechanical prophylaxis; Sequential compression device  Urinary Catheter: No    Basic Metabolic Profile    Lab Results   Component Value Date/Time    NA 138 06/05/2023 03:45 AM    K 4.2 06/05/2023 03:45 AM    CA 8.7 06/05/2023 03:45 AM    CL 105 06/05/2023 03:45 AM    CO2 23 06/05/2023 03:45 AM    GAP 11 06/05/2023 03:45 AM    Lab Results   Component Value Date/Time    BUN 19 06/05/2023 03:45 AM    CR 0.66 06/05/2023 03:45 AM    GLU 132 (H) 06/05/2023 03:45 AM        CBC w/Diff    Lab Results   Component Value Date/Time    WBC 13.00 (H) 06/05/2023 03:45 AM    RBC 4.62 06/05/2023 03:45 AM    HGB 14.7 06/05/2023 03:45 AM    HCT 42.7 06/05/2023 03:45 AM    MCV 92.4 06/05/2023 03:45 AM    MCH 31.7 06/05/2023 03:45 AM    MCHC 34.3 06/05/2023 03:45 AM    RDW 13.4 06/05/2023 03:45 AM    PLTCT 148 (L) 06/05/2023 03:45 AM    MPV 7.2 06/05/2023 03:45 AM    Lab Results   Component Value Date/Time    NEUT 53 03/28/2023 03:57 AM    ANC 5.70 03/28/2023 03:57 AM  LYMA 29 03/28/2023 03:57 AM    ALC 3.00 03/28/2023 03:57 AM    MONA 13 (H) 03/28/2023 03:57 AM    AMC 1.30 (H) 03/28/2023 03:57 AM    EOSA 4 03/28/2023 03:57 AM    AEC 0.40 03/28/2023 03:57 AM    BASA 1 03/28/2023 03:57 AM    ABC 0.10 03/28/2023 03:57 AM

## 2023-06-06 NOTE — Progress Notes
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  Chaplain visited to offer prayer, support and sacramental care.   Patient appeared calm and hopeful but concerned about his health and asked for prayer and sacramental care for healing.  Patient shared about his illness and hopeful to go home.  Chaplain offered active listening, support, prayer and provided the Sacrament of the Sick / Last Rites for peace, hope and healing.  Patient appreciated for visit.  Continue available for support and prayer.     The spiritual care team is available as needed, 24/7, through the campus switchboard [phone]). For a response within 24 hours, please submit an order in [facility] for a chaplain consult.

## 2023-06-07 ENCOUNTER — Encounter: Admit: 2023-06-07 | Discharge: 2023-06-07 | Payer: MEDICARE

## 2023-06-07 LAB — DEVICE EVALUATION - PPM (PERMANENT PACEMAKER)
EP A PACING%: 98 %
EP ATRIAL LEAD DIAPH. STIMULATION: 10
EP GENERATOR IMPLANT DATE: 202
EP RV LEAD DIAPH. STIMULATION: 10
EP RV PACING%: 99 %

## 2023-06-08 ENCOUNTER — Encounter: Admit: 2023-06-08 | Discharge: 2023-06-08 | Payer: MEDICARE

## 2023-06-09 ENCOUNTER — Encounter: Admit: 2023-06-09 | Discharge: 2023-06-09 | Payer: MEDICARE

## 2023-06-10 ENCOUNTER — Encounter: Admit: 2023-06-10 | Discharge: 2023-06-10 | Payer: MEDICARE

## 2023-06-11 ENCOUNTER — Ambulatory Visit: Admit: 2023-06-11 | Discharge: 2023-06-12 | Payer: MEDICARE

## 2023-06-11 ENCOUNTER — Encounter: Admit: 2023-06-11 | Discharge: 2023-06-11 | Payer: MEDICARE

## 2023-06-11 DIAGNOSIS — I10 Essential (primary) hypertension: Secondary | ICD-10-CM

## 2023-06-11 DIAGNOSIS — Z953 Presence of xenogenic heart valve: Secondary | ICD-10-CM

## 2023-06-11 DIAGNOSIS — Z950 Presence of cardiac pacemaker: Secondary | ICD-10-CM

## 2023-06-11 DIAGNOSIS — I5032 Chronic diastolic (congestive) heart failure: Secondary | ICD-10-CM

## 2023-06-11 DIAGNOSIS — Z95818 Presence of other cardiac implants and grafts: Secondary | ICD-10-CM

## 2023-06-11 DIAGNOSIS — I6523 Occlusion and stenosis of bilateral carotid arteries: Secondary | ICD-10-CM

## 2023-06-11 DIAGNOSIS — E7800 Pure hypercholesterolemia, unspecified: Secondary | ICD-10-CM

## 2023-06-11 DIAGNOSIS — I480 Paroxysmal atrial fibrillation: Secondary | ICD-10-CM

## 2023-06-11 DIAGNOSIS — E1151 Type 2 diabetes mellitus with diabetic peripheral angiopathy without gangrene: Secondary | ICD-10-CM

## 2023-06-11 DIAGNOSIS — I2510 Atherosclerotic heart disease of native coronary artery without angina pectoris: Secondary | ICD-10-CM

## 2023-06-11 NOTE — Progress Notes
Date of Service: 06/11/2023    Mason Lawson is a 77 y.o. male.       HPI     I had the pleasure of seeing Mason Lawson [facility] for hospital follow up after recent TAVR.  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     [name] on September 23 and had complaints of chest discomfort therefore he was admitted directly to the hospital for further evaluation. He had an echocardiogram which revealed an EF of 60 to 65%. He grade 2 diastolic dysfunction. There was severe aortic stenosis and mild regurgitation. The mean gradient was 40 mmHg with a velocity of 4.3 m/s. There was mild mitral regurgitation and moderate mitral annular calcification. 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 [name] and was noted to have a patent LIMA to the LAD, patent RIMA Y graft to the OM and patent radial to the PDA with a patent proximal stent. The valve team was consulted and felt he was a candidate for TAVR. [name]. Carotid duplex showed 70% right carotid artery stenosis. [name] who ordered a CTA neck. This showed an 80% proximal right ICA stenosis but because he was asymptomatic medical management was recommended.     [name] for elective TAVR and underwent successful placement of a 26 SAPIEN S3 Resilia bioprosthetic valve via right axillary access with Dr. Ky Barban and Dr. Argie Ramming.  He tolerated the procedure well without complications.  His heart rhythm remained stable with permanent pacemaker in place. A post-operative echocardiogram demonstrated a 26 mm Sapien S3 Resilia bioprosthetic aortic valve present. The prosthetic valve is normal. No stenosis. No regurgitation.  He was discharged with Lasix 40 mg daily with potassium supplementation.  They did hold spironolactone at discharge.  He was stable for discharge on postoperative day #1.  Losartan was decreased to 50 mg daily at discharge.    Today he reports that his activity has been fairly limited due to right shoulder pain.  [name] if this is related to his access site but denies hematoma to the access area.  He has been walking some around the house however.  He has not noted many symptoms changes.  He denies orthopnea, PND, or progressive lower extremity edema.  Has been without dizziness, lightheadedness, chest pain, palpitations, near-syncope, or syncope.         There were no vitals filed for this visit.  There is no height or weight on file to calculate BMI.     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 Ky Barban and Dr. Argie Ramming       Bilateral carotid artery stenosis 05/30/2023    Atherosclerosis of aorta (HCC) 05/29/2023    Former smoker 05/29/2023    Aortic valve disease 03/26/2023    Acute and chronic respiratory failure with hypoxia (HCC) 12/05/2022    Coronary artery disease due to lipid rich plaque 06/22/2022    Presence of Watchman left atrial appendage closure device 02/21/2022     02/20/2022 - Dr.  Noheria; Left atrial appendage occlusion with 31 mm Watchman FLX device      PAF (paroxysmal atrial fibrillation) (HCC) 02/20/2022    Cardiac pacemaker 05/16/2021     01/21/2021 - Successful Dual-chamber Pacemaker Implantation with Dr. Derrell Lolling      Atrial fibrillation [facility]) 05/13/2021    Bradycardia 02/01/2021    DM (diabetes mellitus) type II, controlled, with peripheral vascular disorder (HCC) 02/01/2021    Paroxysmal nocturnal dyspnea 02/01/2021    Atrial flutter (HCC) 01/19/2021     01/21/2021 - AFL RFA with Dr. Wallene Huh:  Successful cavo-tricuspid isthmus ablation.  Comprehensive EP study with coronary sinus catheter placement.  Underlying severe sinus node dysfunction. No escape rhythm.      Chronic diastolic heart failure, NYHA class 2 (HCC) 12/04/2018     RIGHT HEART CATHETERIZATION:  06/2018 in the setting of severe hypertension  HEMODYNAMICS:   Blood pressure 198/81 with a mean of 130 mmHg. Heart rate 50 beats per minute.   PRESSURES:  The right atrial pressure was 4 mmHg. Right ventricular pressure was 64/8 mmHg.  The pulmonary artery pressure was 60/70 mmHg with a mean of 32 mmHg. The pulmonary capillary wedge pressure was 17 mmHg. The transpulmonary gradient was 15 mmHg.  The diastolic pulmonary gradient was 0 mmHg. The pulmonary vascular resistance was 2 Wood units.   RIGHT SATURATIONS: The right atrial saturation 77%. The pulmonary artery saturation was 75%. The aortic saturation was 96%.   OUTPUTS: The cardiac output by Fick method was 5.8 L/minute with a cardiac index of 2.7 L/minute per sq m. The cardiac index by thermodilution was 7.3 with a cardiac index of 3.4 L/minute per sq m.      Dyslipidemia 10/23/2012    Chest pain 09/26/2010    Abnormal stress test 09/26/2010    Murmur 09/23/2010    Coronary artery disease      A. Onset mid 2004, progression fall 2005, patient reduces activity at work to avoid symptoms  B. 12/05 Progressive angina, Dr. Duanne Limerick, then 06/29/04 CBP Atch OV, referred for cath          >12/29 Cath 100%RCA, 75%LAD&Cx,50%LM EF 55%          >07/01/05 CABGx3 LIMA-LAD w/ Y-RIMA-OM, Rad-PDA per Dr. Ky Barban  C. 06/29/08 exercise echocardiographic images demonstrated basal septal ischemia, significant hypertensive response to exercise, good and adequate exercise tolerance with no angina.  D. LHC 06/2018 for new heart failure symptoms: LEFT HEART CATHETERIZATION:  HEMODYNAMICS:  The aortic pressure was- 195/63 with a mean of 105 mmHg .  The left ventricular systolic pressure was 210 mmHg.  There was a peak to peak gradient of 15 mmHg.  Left main coronary artery:  Left main coronary artery arises normally from the left coronary sinus.  The left main coronary artery bifurcates into left anterior descending and left circumflex artery and is free of angiographically significant disease.  Left anterior descending:  Left anterior descending is a large vessel which gives rise to- 3 diagonal vessels. 1 diagonal has a 80% ostial stenosis, the rest of diagonal vessels do not appear to be angiographically significant. Distal LAD has competitive flow from the LIMA.   Left circumflex artery:  The left circumflex artery appears to be medium caliber and has a 100% stenosis proximally. The mid-to-distal portion of the vessel fills from bridging collaterals from proximal portion and is diffusely disease. Three obtuse marginal branches arise, which are also diffusely diseased.   Right coronary artery:  The right coronary artery is  a medium caliber vessel, but has 100% chronic total occlusion after the takeoff of conus branch. The PDA fills retrogradely from the radial graft to supply the PLV and has mild plaquing in the native PDA.    BYPASS GRAFT ANATOMY:  1.The LIMA to LAD was visualized using IM catheter.  The ostium, body was noted to be free of any angiographic disease, but has a 80% stenosis at the anastamosis with diffusely diseased distal LAD. There is a Y jump graft from the LIMA to the OM branch which fills retrogradely.            2. The radial artery graft to the PDA appears to be without any angiographically significant disease at ostium, body or anastomotic site.   E. Regadenosom MPI 11/2018 TID Ratio:  1.05  (normal <1.36). Summed Stress Score:  3   , Summed Rest Score:  0 Quantitative polar maps show a statistically insignificant amount of inferoapical ischemia. Mild inferobasilar septal hypokinesis.       Left Ventricular Ejection Fraction (post stress, in the resting state) =  52 %.  Left Ventricular End Diastolic Volume: 284 mL SUMMARY/OPINION:   This study is borderline abnormal with limited reversible uptake in the inferior wall.  All segments are viable.  Global left ventricular function is within normal limits. In aggregate the current study is low risk in regards to predicted annual cardiovascular mortality rate.         Hypercholesterolemia      A.  02/28/01 total 324 total 249 HDL 43 LDL 231 Pt deferred meds  B. 06/29/04 total 273 trig 193 HDL 36 LDLd206 Zocor begun dc'd by pt d/t statinphobia and cost  C.  2/24 Rx Vytorin 10/80 1/2 tab.      Hypothyroidism      A.  2004 presentation with TSH over 200, Rx w l-thyroxine      Hypertension      A.  2/06 Altace 5 dc'd w/ dysgeusia, Metoprolol 25 BID begun         Tests ordered      A. 07/17/08 carotid artery duplex scan: no significant stenosis           Review of Systems   Constitutional: Positive for malaise/fatigue.   HENT: Negative.     Eyes: Negative.    Cardiovascular: Negative.    Respiratory: Negative.     Endocrine: Negative.    Hematologic/Lymphatic: Negative.    Skin: Negative.    Musculoskeletal:  Positive for joint pain.   Gastrointestinal: Negative.    Genitourinary: Negative.    Neurological: Negative.    Psychiatric/Behavioral: Negative.     Allergic/Immunologic: Negative.        Physical Exam  General Appearance: no acute distress  HEENT: unremarkable  Neck Veins: neck veins are flat & not distended  Chest Inspection: non labored respirations, right chest incision well approximated, no hematoma  Neurologic Exam: oriented to time, place and person; no focal neurologic deficits  Psychiatric: Normal mood and affect.  Behavior is normal. Judgment and thought content normal.       Cardiovascular Studies      Cardiovascular Health Factors  Vitals BP Readings from Last 3 Encounters:   06/05/23 (!) 144/62   05/30/23 114/42   05/30/23 114/43     Wt Readings from Last 3 Encounters:   06/05/23 97.7 kg (215 lb 6.2 oz)   05/30/23 100.7 kg (222 lb)   05/30/23 101.1 kg (222 lb 12.8 oz) BMI Readings from  Last 3 Encounters:   06/05/23 29.63 kg/m?   05/30/23 30.96 kg/m?   05/30/23 30.64 kg/m?      Smoking Social History     Tobacco Use   Smoking Status Former    Current packs/day: 0.00    Average packs/day: 1 pack/day for 40.0 years (40.0 ttl pk-yrs)    Types: Cigarettes    Start date: 06/02/1964    Quit date: 06/02/2004    Years since quitting: 19.0   Smokeless Tobacco Never      Lipid Profile Cholesterol   Date Value Ref Range Status   03/27/2023 71 <200 MG/DL Final     HDL   Date Value Ref Range Status   03/27/2023 42 >40 MG/DL Final     LDL   Date Value Ref Range Status   03/27/2023 21 <100 mg/dL Final     Triglycerides   Date Value Ref Range Status   03/27/2023 124 <150 MG/DL Final      Blood Sugar Hemoglobin A1C   Date Value Ref Range Status   03/26/2023 6.7 (H) 4.0 - 5.7 % Final     Comment:     The ADA recommends that most patients with type 1 and type 2 diabetes maintain   an A1c level <7%.       Glucose   Date Value Ref Range Status   06/05/2023 132 (H) 70 - 100 mg/dL Final   16/04/9603 540 (H) 70 - 100 mg/dL Final   98/05/9146 829 (H) 70 - 100 mg/dL Final     Glucose, POC   Date Value Ref Range Status   06/05/2023 171 (H) 70 - 100 mg/dL Final   56/21/3086 578 (H) 70 - 100 mg/dL Final   46/96/2952 841 (H) 70 - 100 mg/dL Final          Problems Addressed Today  Encounter Diagnoses   Name Primary?    S/p TAVR (transcatheter aortic valve replacement), bioprosthetic Yes    PAF (paroxysmal atrial fibrillation) (HCC)     Presence of Watchman left atrial appendage closure device     Primary hypertension     Hypercholesterolemia     Coronary artery disease due to lipid rich plaque     Chronic diastolic heart failure, NYHA class 2 (HCC)     Cardiac pacemaker     Bilateral carotid artery stenosis     DM (diabetes mellitus) type II, controlled, with peripheral vascular disorder (HCC)        Assessment and Plan     Aortic stenosis  Status post TAVR with a 26 SAPIEN S3 bioprosthetic valve.  I have encouraged him to gradually increase activity as tolerated.  I have asked him to hold off on swelling until his right axillary access site is completely healed.  He will return on 1/15 for echo and office visit as part of the TVT registry.  He does not plan to do cardiac rehab as he would like to get back to his routine swimming exercise.    Chronic diastolic heart failure  He denies evidence of volume overload.  He has NYHA class 2 symptoms.    Coronary artery disease  He had prior CABG.  His cardiac cath revealed patent grafts.  He is a candidate for continued medical management and risk factor modification.    Paroxysmal atrial fibrillation  He has a Youth worker in place.    Carotid artery disease  He has a right 80% carotid artery stenosis.  He was evaluated by  vascular surgery and they recommended medical management.    Diabetes mellitus, type II    Sinus node dysfunction  He has a permanent pacemaker in place.    Hypertension  His blood pressures have been optimally controlled.  Will continue losartan at the lower dose for now but may need to increase at his follow-up visit.    I appreciate the opportunity to participate in his care.    Dorena Cookey, APRN  Pager [phone]         Current Medications (including today's revisions)   aspirin EC (ASPIR-LOW) 81 mg tablet Take one tablet by mouth daily. Indications: s/p Watchman    bisoprolol fumarate (ZEBETA) 5 mg tablet Take one-half tablet by mouth daily. Indications: myocardial reinfarction prevention    calcium carbonate (TUMS) 500 mg (200 mg elemental calcium) chewable tablet Chew one tablet by mouth daily as needed.    CHOLEcalciferoL (vitamin D3) (VITAMIN D3) 50 mcg (2,000 unit) tablet Take one tablet by mouth every 48 hours.    coenzyme Q10 100 mg cap Take one capsule by mouth three times weekly. Monday, Wednesday and Friday.    Cranberry 500 mg cap Take one capsule by mouth daily.    cyanocobalamin (vitamin B-12) 1,000 mcg tablet Take one tablet by mouth every 48 hours.    evolocumab (REPATHA SURECLICK) 140 mg/mL injectable PEN Inject 1 mL under the skin every 14 days.    ezetimibe (ZETIA) 10 mg tablet TAKE 1 TABLET EVERY DAY    ferrous sulfate (FEOSOL) 325 mg (65 mg iron) tablet Take one tablet by mouth three times weekly. Take on an empty stomach at least 1 hour before or 2 hours after food. Takes on Monday, Wednesday and Friday    furosemide (LASIX) 40 mg tablet TAKE ONE TABLET DAILY. MAY TAKE AN ADDITIONAL LASIX AS NEEDED (DOSE ADJUSTMENT) (Patient taking differently: Take one tablet by mouth daily. Pt takes 40 mg one day and 20 mg the next (alternating dosages daily))    glucosamine/chondro su A/C/Mn (GLUCOSAMINE-CHONDROITIN COMPLX PO) Take 1 tablet by mouth daily.    levothyroxine (SYNTHROID) 175 mcg tablet Take one tablet by mouth daily 30 minutes before breakfast.    losartan (COZAAR) 100 mg tablet Take one-half tablet by mouth daily. Indications: high blood pressure    magnesium glycinate 100 mg magnesium capsule Take one capsule by mouth every 48 hours.    metFORMIN (GLUCOPHAGE) 500 mg tablet Take one tablet by mouth twice daily with meals. Ok to resume 06/06/23  Indications: type 2 diabetes mellitus    omeprazole DR(+) (PRILOSEC) 20 mg PO capsule Take one capsule by mouth daily.    potassium chloride SR (KLOR-CON M20) 20 mEq tablet Take one tablet by mouth daily. Take while taking furosemide.  Indications: prevention of low potassium in the blood    rosuvastatin (CRESTOR) 20 mg tablet Take one tablet by mouth daily.    vitamins, multiple tablet Take one tablet by mouth every 48 hours.

## 2023-06-12 ENCOUNTER — Encounter: Admit: 2023-06-12 | Discharge: 2023-06-12 | Payer: MEDICARE

## 2023-06-13 ENCOUNTER — Encounter: Admit: 2023-06-13 | Discharge: 2023-06-13 | Payer: MEDICARE

## 2023-06-21 ENCOUNTER — Encounter: Admit: 2023-06-21 | Discharge: 2023-06-21 | Payer: MEDICARE

## 2023-06-21 DIAGNOSIS — I10 Essential (primary) hypertension: Secondary | ICD-10-CM

## 2023-06-21 DIAGNOSIS — Z953 Presence of xenogenic heart valve: Secondary | ICD-10-CM

## 2023-07-03 ENCOUNTER — Encounter: Admit: 2023-07-03 | Discharge: 2023-07-03 | Payer: MEDICARE

## 2023-07-03 NOTE — Telephone Encounter
Left voicemail for patient. Per Dr. Maye Hides, patient needs OV to discuss right carotid artery intervention after pt sees cardiology for follow up 07/18/23.

## 2023-07-05 ENCOUNTER — Encounter: Admit: 2023-07-05 | Discharge: 2023-07-05 | Payer: MEDICARE

## 2023-07-05 MED ORDER — FUROSEMIDE 40 MG PO TAB
40 mg | ORAL_TABLET | Freq: Every day | ORAL | 3 refills | 90.00000 days | Status: AC
Start: 2023-07-05 — End: ?

## 2023-07-10 ENCOUNTER — Encounter: Admit: 2023-07-10 | Discharge: 2023-07-10 | Payer: MEDICARE

## 2023-07-10 MED ORDER — FUROSEMIDE 40 MG PO TAB
ORAL_TABLET | ORAL | 3 refills | 90.00000 days | Status: AC
Start: 2023-07-10 — End: ?

## 2023-07-11 ENCOUNTER — Encounter: Admit: 2023-07-11 | Discharge: 2023-07-11 | Payer: MEDICARE

## 2023-07-12 ENCOUNTER — Encounter: Admit: 2023-07-12 | Discharge: 2023-07-12 | Payer: MEDICARE

## 2023-07-12 MED FILL — REPATHA SURECLICK 140 MG/ML SC PNIJ: 140 mg/mL | SUBCUTANEOUS | 28 days supply | Qty: 2 | Fill #2 | Status: AC

## 2023-07-18 ENCOUNTER — Ambulatory Visit: Admit: 2023-07-18 | Discharge: 2023-07-19 | Payer: MEDICARE

## 2023-07-18 ENCOUNTER — Encounter: Admit: 2023-07-18 | Discharge: 2023-07-18 | Payer: MEDICARE

## 2023-07-18 ENCOUNTER — Ambulatory Visit: Admit: 2023-07-18 | Discharge: 2023-07-18 | Payer: MEDICARE

## 2023-07-18 DIAGNOSIS — Z953 Presence of xenogenic heart valve: Secondary | ICD-10-CM

## 2023-07-18 DIAGNOSIS — I5032 Chronic diastolic (congestive) heart failure: Secondary | ICD-10-CM

## 2023-07-18 DIAGNOSIS — I480 Paroxysmal atrial fibrillation: Secondary | ICD-10-CM

## 2023-07-18 DIAGNOSIS — I10 Essential (primary) hypertension: Secondary | ICD-10-CM

## 2023-07-18 DIAGNOSIS — E7800 Pure hypercholesterolemia, unspecified: Secondary | ICD-10-CM

## 2023-07-18 DIAGNOSIS — Z95818 Presence of other cardiac implants and grafts: Secondary | ICD-10-CM

## 2023-07-18 DIAGNOSIS — E1151 Type 2 diabetes mellitus with diabetic peripheral angiopathy without gangrene: Secondary | ICD-10-CM

## 2023-07-18 DIAGNOSIS — Z950 Presence of cardiac pacemaker: Secondary | ICD-10-CM

## 2023-07-18 DIAGNOSIS — R0989 Other specified symptoms and signs involving the circulatory and respiratory systems: Secondary | ICD-10-CM

## 2023-07-18 DIAGNOSIS — I2510 Atherosclerotic heart disease of native coronary artery without angina pectoris: Secondary | ICD-10-CM

## 2023-07-18 MED ORDER — LOSARTAN 100 MG PO TAB
100 mg | Freq: Every day | ORAL | 0 refills | 90.00000 days | Status: AC
Start: 2023-07-18 — End: ?

## 2023-07-18 NOTE — Progress Notes
Date of Service: 07/18/2023    Mason Lawson is a 78 y.o. male.       HPI     I had the pleasure of seeing Mason Lawson for hospital follow up after recent TAVR.  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      He saw Dr. Arna Medici on September 23 and had complaints of chest discomfort therefore he was admitted directly to the hospital for further evaluation. He had an echocardiogram which revealed an EF of 60 to 65%. He grade 2 diastolic dysfunction. There was severe aortic stenosis and mild regurgitation. The mean gradient was 40 mmHg with a velocity of 4.3 m/s. There was mild mitral regurgitation and moderate mitral annular calcification. 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 [name] and was noted to have a patent LIMA to the LAD, patent RIMA Y graft to the OM and patent radial to the PDA with a patent proximal stent. The valve team was consulted and felt he was a candidate for TAVR. [name]. Carotid duplex showed 70% right carotid artery stenosis. [name] who ordered a CTA neck. This showed an 80% proximal right ICA stenosis but because he was asymptomatic medical management was recommended.      [name] for elective TAVR and underwent successful placement of a 26 SAPIEN S3 Resilia bioprosthetic valve via right axillary access with Dr. Ky Barban and Dr. Argie Ramming.  He tolerated the procedure well without complications.  His heart rhythm remained stable with permanent pacemaker in place. A post-operative echocardiogram demonstrated a 26 mm Sapien S3 Resilia bioprosthetic aortic valve present. The prosthetic valve is normal. No stenosis. No regurgitation.  He was discharged with Lasix 40 mg daily with potassium supplementation.  They did hold spironolactone at discharge.  He was stable for discharge on postoperative day #1.  Losartan was decreased to 50 mg daily at discharge.    Today he reports stable cardiac symptoms.  He denies progressive shortness of breath with exertion, orthopnea, PND, or lower extremity edema.  Has been without chest pain, palpitations, dizziness, lightheadedness, or near syncope.  He continues to do routine walks and states that he can get up to 420 yards without stopping now.  He has been tolerating this well.  He notes that his blood pressures have been slowly increasing at home with systolics in the 135-145 range.            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 Ky Barban and Dr. Argie Ramming       Bilateral carotid artery stenosis 05/30/2023    Atherosclerosis of aorta (HCC) 05/29/2023    Former smoker 05/29/2023    Aortic valve disease 03/26/2023    Acute  and chronic respiratory failure with hypoxia (HCC) 12/05/2022    Coronary artery disease due to lipid rich plaque 06/22/2022    Presence of Watchman left atrial appendage closure device 02/21/2022     02/20/2022 - Dr. Milas Kocher; Left atrial appendage occlusion with 31 mm Watchman FLX device      PAF (paroxysmal atrial fibrillation) (HCC) 02/20/2022    Cardiac pacemaker 05/16/2021     01/21/2021 - Successful Dual-chamber Pacemaker Implantation with Dr. Derrell Lolling      Atrial fibrillation [facility]) 05/13/2021    Bradycardia 02/01/2021    DM (diabetes mellitus) type II, controlled, with peripheral vascular disorder (HCC) 02/01/2021    Paroxysmal nocturnal dyspnea 02/01/2021    Atrial flutter (HCC) 01/19/2021     01/21/2021 - AFL RFA with Dr. Wallene Huh:  Successful cavo-tricuspid isthmus ablation.  Comprehensive EP study with coronary sinus catheter placement.  Underlying severe sinus node dysfunction. No escape rhythm.      Chronic diastolic heart failure, NYHA class 2 (HCC) 12/04/2018     RIGHT HEART CATHETERIZATION:  06/2018 in the setting of severe hypertension  HEMODYNAMICS:   Blood pressure 198/81 with a mean of 130 mmHg. Heart rate 50 beats per minute.   PRESSURES:  The right atrial pressure was 4 mmHg. Right ventricular pressure was 64/8 mmHg.  The pulmonary artery pressure was 60/70 mmHg with a mean of 32 mmHg. The pulmonary capillary wedge pressure was 17 mmHg. The transpulmonary gradient was 15 mmHg.  The diastolic pulmonary gradient was 0 mmHg. The pulmonary vascular resistance was 2 Wood units.   RIGHT SATURATIONS: The right atrial saturation 77%. The pulmonary artery saturation was 75%. The aortic saturation was 96%.   OUTPUTS: The cardiac output by Fick method was 5.8 L/minute with a cardiac index of 2.7 L/minute per sq m. The cardiac index by thermodilution was 7.3 with a cardiac index of 3.4 L/minute per sq m.      Dyslipidemia 10/23/2012    Chest pain 09/26/2010    Abnormal stress test 09/26/2010    Murmur 09/23/2010    Coronary artery disease      A. Onset mid 2004, progression fall 2005, patient reduces activity at work to avoid symptoms  B. 12/05 Progressive angina, Dr. Duanne Limerick, then 06/29/04 CBP Atch OV, referred for cath          >12/29 Cath 100%RCA, 75%LAD&Cx,50%LM EF 55%          >07/01/05 CABGx3 LIMA-LAD w/ Y-RIMA-OM, Rad-PDA per Dr. Ky Barban  C. 06/29/08 exercise echocardiographic images demonstrated basal septal ischemia, significant hypertensive response to exercise, good and adequate exercise tolerance with no angina.  D. LHC 06/2018 for new heart failure symptoms: LEFT HEART CATHETERIZATION:  HEMODYNAMICS:  The aortic pressure was- 195/63 with a mean of 105 mmHg .  The left ventricular systolic pressure was 210 mmHg.  There was a peak to peak gradient of 15 mmHg.  Left main coronary artery:  Left main coronary artery arises normally from the left coronary sinus.  The left main coronary artery bifurcates into left anterior descending and left circumflex artery and is free of angiographically significant disease.  Left anterior descending:  Left anterior descending is a large vessel which gives rise to- 3 diagonal vessels. 1 diagonal has a 80% ostial stenosis, the rest of diagonal vessels do not appear to be angiographically significant. Distal LAD has competitive flow from the LIMA.   Left circumflex artery:  The left circumflex artery appears to be medium caliber and has a 100% stenosis  proximally. The mid-to-distal portion of the vessel fills from bridging collaterals from proximal portion and is diffusely disease. Three obtuse marginal branches arise, which are also diffusely diseased.   Right coronary artery:  The right coronary artery is a medium caliber vessel, but has 100% chronic total occlusion after the takeoff of conus branch. The PDA fills retrogradely from the radial graft to supply the PLV and has mild plaquing in the native PDA.    BYPASS GRAFT ANATOMY:  1.The LIMA to LAD was visualized using IM catheter.  The ostium, body was noted to be free of any angiographic disease, but has a 80% stenosis at the anastamosis with diffusely diseased distal LAD. There is a Y jump graft from the LIMA to the OM branch which fills retrogradely.            2. The radial artery graft to the PDA appears to be without any angiographically significant disease at ostium, body or anastomotic site.   E. Regadenosom MPI 11/2018 TID Ratio:  1.05  (normal <1.36). Summed Stress Score:  3   , Summed Rest Score:  0 Quantitative polar maps show a statistically insignificant amount of inferoapical ischemia. Mild inferobasilar septal hypokinesis.       Left Ventricular Ejection Fraction (post stress, in the resting state) =  52 %.  Left Ventricular End Diastolic Volume: 604 mL       SUMMARY/OPINION:   This study is borderline abnormal with limited reversible uptake in the inferior wall.  All segments are viable.  Global left ventricular function is within normal limits. In aggregate the current study is low risk in regards to predicted annual cardiovascular mortality rate.         Hypercholesterolemia      A.  02/28/01 total 324 total 249 HDL 43 LDL 231 Pt deferred meds  B. 06/29/04 total 273 trig 193 HDL 36 LDLd206 Zocor begun dc'd by pt d/t statinphobia and cost  C.  2/24 Rx Vytorin 10/80 1/2 tab.      Hypothyroidism      A.  2004 presentation with TSH over 200, Rx w l-thyroxine      Hypertension      A.  2/06 Altace 5 dc'd w/ dysgeusia, Metoprolol 25 BID begun         Tests ordered      A. 07/17/08 carotid artery duplex scan: no significant stenosis           Review of Systems   Constitutional: Negative.   HENT: Negative.     Eyes: Negative.    Cardiovascular: Negative.    Respiratory: Negative.     Endocrine: Negative.    Hematologic/Lymphatic: Negative.    Skin: Negative.    Musculoskeletal: Negative.    Gastrointestinal: Negative.    Genitourinary: Negative.    Neurological: Negative.    Psychiatric/Behavioral: Negative.     Allergic/Immunologic: Negative.        Physical Exam  General Appearance: no acute distress  Skin: warm & intact  HEENT: unremarkable  Neck Veins: neck veins are flat & not distended  Chest Inspection: chest is normal in appearance  Auscultation/Percussion: lungs clear to auscultation, no rales, rhonchi, or wheezing  Cardiac Rhythm: regular rhythm & normal rate  Cardiac Auscultation: Normal S1 & S2, no S3 or S4, no rub  Murmurs: Soft holosystolic murmur  Extremities: no lower extremity edema  Abdominal Exam: soft, non-tender, no masses, bowel sounds normal  Liver & Spleen: no organomegaly  Neurologic Exam: oriented  to time, place and person; no focal neurologic deficits  Psychiatric: Normal mood and affect.  Behavior is normal. Judgment and thought content normal.       Cardiovascular Studies  Preliminary EKG: AV paced    Cardiovascular Health Factors  Vitals BP Readings from Last 3 Encounters:   07/18/23 120/62   07/18/23 121/62   06/05/23 (!) 144/62     Wt Readings from Last 3 Encounters:   07/18/23 98 kg (216 lb)   07/18/23 98.2 kg (216 lb 9.6 oz)   06/05/23 97.7 kg (215 lb 6.2 oz)     BMI Readings from Last 3 Encounters:   07/18/23 30.13 kg/m?   07/18/23 29.79 kg/m?   06/05/23 29.63 kg/m?      Smoking Social History     Tobacco Use   Smoking Status Former    Current packs/day: 0.00    Average packs/day: 1 pack/day for 40.0 years (40.0 ttl pk-yrs)    Types: Cigarettes    Start date: 06/02/1964    Quit date: 06/02/2004    Years since quitting: 19.1   Smokeless Tobacco Never      Lipid Profile Cholesterol   Date Value Ref Range Status   03/27/2023 71 <200 MG/DL Final     HDL   Date Value Ref Range Status   03/27/2023 42 >40 MG/DL Final     LDL   Date Value Ref Range Status   03/27/2023 21 <100 mg/dL Final     Triglycerides   Date Value Ref Range Status   03/27/2023 124 <150 MG/DL Final      Blood Sugar Hemoglobin A1C   Date Value Ref Range Status   03/26/2023 6.7 (H) 4.0 - 5.7 % Final     Comment:     The ADA recommends that most patients with type 1 and type 2 diabetes maintain   an A1c level <7%.       Glucose   Date Value Ref Range Status   06/05/2023 132 (H) 70 - 100 mg/dL Final   16/04/9603 540 (H) 70 - 100 mg/dL Final   98/05/9146 829 (H) 70 - 100 mg/dL Final     Glucose, POC   Date Value Ref Range Status   06/05/2023 171 (H) 70 - 100 mg/dL Final   56/21/3086 578 (H) 70 - 100 mg/dL Final   46/96/2952 841 (H) 70 - 100 mg/dL Final          Problems Addressed Today  Encounter Diagnoses   Name Primary?    S/p TAVR (transcatheter aortic valve replacement), bioprosthetic Yes    Cardiovascular symptoms     PAF (paroxysmal atrial fibrillation) (HCC) Presence of Watchman left atrial appendage closure device     Primary hypertension     Hypercholesterolemia     Coronary artery disease due to lipid rich plaque     Chronic diastolic heart failure, NYHA class 2 (HCC)     Cardiac pacemaker     DM (diabetes mellitus) type II, controlled, with peripheral vascular disorder (HCC)        Assessment and Plan     Aortic stenosis  Status post TAVR with a 26 SAPIEN S3 bioprosthetic valve.  Echo from today is pending.  He has follow-up with Dr. Arna Medici in May.  He will otherwise return for 1 year valve follow-up with echo as part of the TVT registry.  He should continue aspirin and SBE prophylaxis lifelong.    Chronic diastolic heart failure  He appears well  compensated today on exam.  He has NYHA class 2 symptoms.     Coronary artery disease  He had prior CABG.  His cardiac cath revealed patent grafts.  He is a candidate for continued medical management and risk factor modification.     Paroxysmal atrial fibrillation  He has a Youth worker in place.     Carotid artery disease  He has a right 80% carotid artery stenosis.  He was evaluated by vascular surgery and they recommended medical management.     Diabetes mellitus, type II     Sinus node dysfunction  He has a permanent pacemaker in place.     Hypertension  His blood pressures have been slightly elevated at home.  I have asked him to increase Losartan to 100mg  daily which was his previous dose.  He will continue to keep a log of his blood pressures and report back to Dr. Arna Medici for any further concerns.     I appreciate the opportunity to participate in his care.     Dorena Cookey, APRN  Pager [phone]         Current Medications (including today's revisions)   aspirin EC (ASPIR-LOW) 81 mg tablet Take one tablet by mouth daily. Indications: s/p Watchman    bisoprolol fumarate (ZEBETA) 5 mg tablet Take one-half tablet by mouth daily. Indications: myocardial reinfarction prevention    calcium carbonate (TUMS) 500 mg (200 mg elemental calcium) chewable tablet Chew one tablet by mouth daily as needed.    CHOLEcalciferoL (vitamin D3) (VITAMIN D3) 50 mcg (2,000 unit) tablet Take one tablet by mouth every 48 hours.    coenzyme Q10 100 mg cap Take one capsule by mouth three times weekly. Monday, Wednesday and Friday.    Cranberry 500 mg cap Take one capsule by mouth daily.    cyanocobalamin (vitamin B-12) 1,000 mcg tablet Take one tablet by mouth every 48 hours.    evolocumab (REPATHA SURECLICK) 140 mg/mL injectable PEN Inject 1 mL under the skin every 14 days.    ezetimibe (ZETIA) 10 mg tablet TAKE 1 TABLET EVERY DAY    ferrous sulfate (FEOSOL) 325 mg (65 mg iron) tablet Take one tablet by mouth three times weekly. Take on an empty stomach at least 1 hour before or 2 hours after food. Takes on Monday, Wednesday and Friday    furosemide (LASIX) 40 mg tablet Take 1/2-1 tablet by mouth daily as directed for swelling and weight gain.    glucosamine/chondro su A/C/Mn (GLUCOSAMINE-CHONDROITIN COMPLX PO) Take 1 tablet by mouth daily.    levothyroxine (SYNTHROID) 175 mcg tablet Take one tablet by mouth daily 30 minutes before breakfast.    losartan (COZAAR) 100 mg tablet Take one tablet by mouth daily. Indications: high blood pressure    magnesium glycinate 100 mg magnesium capsule Take one capsule by mouth every 48 hours.    metFORMIN (GLUCOPHAGE) 500 mg tablet Take one tablet by mouth twice daily with meals. Ok to resume 06/06/23  Indications: type 2 diabetes mellitus    omeprazole DR(+) (PRILOSEC) 20 mg PO capsule Take one capsule by mouth daily.    potassium chloride SR (KLOR-CON M20) 20 mEq tablet Take one tablet by mouth daily. Take while taking furosemide.  Indications: prevention of low potassium in the blood (Patient not taking: Reported on 07/18/2023)    rosuvastatin (CRESTOR) 20 mg tablet Take one tablet by mouth daily.    vitamins, multiple tablet Take one tablet by mouth every 48 hours.

## 2023-07-18 NOTE — Patient Instructions
Increase Losartan back to 100mg  daily.     You have a follow up with Dr. Arna Medici in May.  You will return for valve follow up in one year with echo.     You should continue baby aspirin lifelong.  You will need antibiotics prior to dental work lifelong.

## 2023-07-19 ENCOUNTER — Encounter: Admit: 2023-07-19 | Discharge: 2023-07-19 | Payer: MEDICARE

## 2023-07-19 LAB — CBC
HEMATOCRIT: 46
HEMOGLOBIN: 15
MCH: 30
MCHC: 33
MCV: 90
MPV: 9.7
PLATELET COUNT: 224
RBC COUNT: 5.1
RDW: 14
WBC COUNT: 12 — ABNORMAL HIGH (ref 4.23–9.07)

## 2023-08-06 ENCOUNTER — Encounter: Admit: 2023-08-06 | Discharge: 2023-08-06 | Payer: MEDICARE

## 2023-08-06 ENCOUNTER — Ambulatory Visit: Admit: 2023-08-06 | Discharge: 2023-08-07 | Payer: MEDICARE

## 2023-08-06 DIAGNOSIS — I6521 Occlusion and stenosis of right carotid artery: Secondary | ICD-10-CM

## 2023-08-06 NOTE — Progress Notes
Telehealth Visit Note    Date of Service: 08/06/2023    Subjective:           Shafin Pollio is a 78 y.o. male.    History of Present Illness    The patient is a pleasant 78 year old Caucasian gentleman who I am seeing today via telehealth for follow-up of carotid artery stenosis.  I originally saw the patient in October 2024 for initial consultation regarding this.  Patient has a past medical history notable for multiple medical problems including coronary artery disease status post CABG, atrial fibrillation status post watchman, hypertension, COPD, diabetes mellitus non-insulin-requiring, GERD and aortic stenosis status post TAVR.  Patient was undergoing workup for TAVR when some right carotid artery stenosis was identified on duplex to be greater than 70%.  The patient is asymptomatic from all this.  I recommended a CTA for further evaluation.  In workup the patient had become symptomatic from his from his aortic stenosis and TAVR was done and December.  He states that he has no real symptoms on examination today.  States he feels better from a shortness of breath and has no dyspnea.  He remains asymptomatic from his carotid disease and has no recent history of stroke, TIA, or amaurosis fugax.  He states he has some upcoming issues regarding his wife's health in the next month or so.  I think her hip is going to be repaired.               Objective:          aspirin EC (ASPIR-LOW) 81 mg tablet Take one tablet by mouth daily. Indications: s/p Watchman    bisoprolol fumarate (ZEBETA) 5 mg tablet Take one-half tablet by mouth daily. Indications: myocardial reinfarction prevention    calcium carbonate (TUMS) 500 mg (200 mg elemental calcium) chewable tablet Chew one tablet by mouth daily as needed.    CHOLEcalciferoL (vitamin D3) (VITAMIN D3) 50 mcg (2,000 unit) tablet Take one tablet by mouth every 48 hours.    coenzyme Q10 100 mg cap Take one capsule by mouth three times weekly. Monday, Wednesday and Friday.    Cranberry 500 mg cap Take one capsule by mouth daily.    cyanocobalamin (vitamin B-12) 1,000 mcg tablet Take one tablet by mouth every 48 hours.    evolocumab (REPATHA SURECLICK) 140 mg/mL injectable PEN Inject 1 mL under the skin every 14 days.    ezetimibe (ZETIA) 10 mg tablet TAKE 1 TABLET EVERY DAY    ferrous sulfate (FEOSOL) 325 mg (65 mg iron) tablet Take one tablet by mouth three times weekly. Take on an empty stomach at least 1 hour before or 2 hours after food. Takes on Monday, Wednesday and Friday    furosemide (LASIX) 40 mg tablet Take 1/2-1 tablet by mouth daily as directed for swelling and weight gain.    glucosamine/chondro su A/C/Mn (GLUCOSAMINE-CHONDROITIN COMPLX PO) Take 1 tablet by mouth daily.    levothyroxine (SYNTHROID) 175 mcg tablet Take one tablet by mouth daily 30 minutes before breakfast.    losartan (COZAAR) 100 mg tablet Take one tablet by mouth daily. Indications: high blood pressure    magnesium glycinate 100 mg magnesium capsule Take one capsule by mouth every 48 hours.    metFORMIN (GLUCOPHAGE) 500 mg tablet Take one tablet by mouth twice daily with meals. Ok to resume 06/06/23  Indications: type 2 diabetes mellitus    omeprazole DR(+) (PRILOSEC) 20 mg PO capsule Take one capsule by mouth  daily.    potassium chloride SR (KLOR-CON M20) 20 mEq tablet Take one tablet by mouth daily. Take while taking furosemide.  Indications: prevention of low potassium in the blood    rosuvastatin (CRESTOR) 20 mg tablet Take one tablet by mouth daily.    vitamins, multiple tablet Take one tablet by mouth every 48 hours.          Telehealth Patient Reported Vitals       Row Name 08/06/23 1003                BP: 137/77        BP Source: Arm, Left Upper        BP Position: Sitting        BP Method: Automatic/Digital Reading        Pulse: 88        Weight: 98.4 kg (217 lb)        Height: 181.6 cm (5' 11.5)        Pain Score: Zero                      Telehealth Body Mass Index: [phone] at 08/06/2023 10:04 AM    Physical Exam  Deferred.       Assessment and Plan:    The patient is a pleasant 78 year old gentleman who has a history of multiple medical problems who I am seeing today for follow-up of carotid artery stenosis.  We discussed that on a CTA this suggested that there was a greater than 80% narrowing per NASCET guidelines which I do agree with based on my measurements and measure about 82 to 83%.  He is status post TAVR and from a cardiac standpoint much improved.  He is asymptomatic from his carotid disease.    Asymptomatic Carotid Stenosis > 60%  (ACAS trial) data demonstrated the risk of an ipsilateral stroke or death within 5 years following carotid surgery was 5% including perioperative stroke. The risk of a stroke in medically treatment patients was 11% in 5 years demonstrating an absolute risk reduction of 6% over 5 years or 1.2% per year.     I discussed that he would be a candidate for a awake right carotid endarterectomy with cervical block.  I discussed the procedure, risk, details, benefits and alternatives.  He stated to me that given the slight reduction in stroke risk benefit and surgery would like to hold off see how she is doing and we discussed all of this.  I think this is reasonable.  Would recommend a 1 month follow-up with a ultrasound the right carotid.  This prior ultrasound was done by the cardiology services and really just showed an elevated ICA/CCA ratio.  Will plan to see him in follow-up in a month or so.  All questions answered he is agreeable this plan.

## 2023-08-07 ENCOUNTER — Encounter: Admit: 2023-08-07 | Discharge: 2023-08-07 | Payer: MEDICARE

## 2023-08-08 ENCOUNTER — Encounter: Admit: 2023-08-08 | Discharge: 2023-08-08 | Payer: MEDICARE

## 2023-08-09 ENCOUNTER — Encounter: Admit: 2023-08-09 | Discharge: 2023-08-09 | Payer: MEDICARE

## 2023-08-09 MED FILL — REPATHA SURECLICK 140 MG/ML SC PNIJ: 140 mg/mL | SUBCUTANEOUS | 28 days supply | Qty: 2 | Fill #3 | Status: AC

## 2023-08-30 ENCOUNTER — Ambulatory Visit: Admit: 2023-08-30 | Discharge: 2023-08-31 | Payer: MEDICARE

## 2023-08-30 ENCOUNTER — Encounter: Admit: 2023-08-30 | Discharge: 2023-08-30 | Payer: MEDICARE

## 2023-08-30 MED ORDER — AMLODIPINE 5 MG PO TAB
5 mg | ORAL_TABLET | Freq: Every day | ORAL | 1 refills | Status: AC
Start: 2023-08-30 — End: ?

## 2023-08-30 NOTE — Telephone Encounter
 Discussed blood pressures and medications with Dr. Arna Medici by phone. He recommends for patient to restart his Amlodipine at 5mg  once daily and monitor his blood pressures over the next month. If pressures remain elevated he could increase Amlodipine to 5mg  daily as previously prescribed. As long as patient is doing well he can keep his follow up with Dr. Arna Medici in May.

## 2023-09-04 ENCOUNTER — Encounter: Admit: 2023-09-04 | Discharge: 2023-09-04 | Payer: MEDICARE

## 2023-09-05 ENCOUNTER — Encounter: Admit: 2023-09-05 | Discharge: 2023-09-05 | Payer: MEDICARE

## 2023-09-05 DIAGNOSIS — I10 Essential (primary) hypertension: Secondary | ICD-10-CM

## 2023-09-05 DIAGNOSIS — E785 Hyperlipidemia, unspecified: Secondary | ICD-10-CM

## 2023-09-05 DIAGNOSIS — Z959 Presence of cardiac and vascular implant and graft, unspecified: Secondary | ICD-10-CM

## 2023-09-05 DIAGNOSIS — R072 Precordial pain: Secondary | ICD-10-CM

## 2023-09-05 DIAGNOSIS — I4729 Nonsustained ventricular tachycardia (HCC): Secondary | ICD-10-CM

## 2023-09-05 DIAGNOSIS — Z953 Presence of xenogenic heart valve: Secondary | ICD-10-CM

## 2023-09-05 DIAGNOSIS — I2510 Atherosclerotic heart disease of native coronary artery without angina pectoris: Secondary | ICD-10-CM

## 2023-09-05 DIAGNOSIS — Z950 Presence of cardiac pacemaker: Secondary | ICD-10-CM

## 2023-09-05 LAB — DEVICE EVALUATION - REMOTE PPM
EP A PACING%: 97 %
EP ATRIAL LEAD DIAPH. STIMULATION: 10
EP DEVICE BATTERY REMAINING LONGEVITY YEARS: 8.5 a
EP DEVICE BATTERY REMAINING LONGEVITY: 102 mo
EP DEVICE BATTERY VOLTAGE: 3
EP GENERATOR IMPLANT DATE: 202
EP RV LEAD DIAPH. STIMULATION: 10
EP RV PACING%: 99 %

## 2023-09-05 MED FILL — REPATHA SURECLICK 140 MG/ML SC PNIJ: 140 mg/mL | SUBCUTANEOUS | 28 days supply | Qty: 2 | Fill #4 | Status: AC

## 2023-09-05 NOTE — Telephone Encounter
 Recommendations called to patient. Patient states that he is currently asymptomatic. Educated patient about warning symptoms. Patient verbalized understanding.

## 2023-09-05 NOTE — Telephone Encounter
-----   Message from Wesley Blas, MD sent at 09/05/2023 12:21 PM CST -----  Mason Lawson: Please schedule Mr. Marrazzo to see available EP.  He is having episodes of nonsustained ventricular tachycardia following TAVR.  Thanks.  SBG

## 2023-09-13 ENCOUNTER — Encounter: Admit: 2023-09-13 | Discharge: 2023-09-13 | Payer: MEDICARE

## 2023-09-17 ENCOUNTER — Encounter: Admit: 2023-09-17 | Discharge: 2023-09-17 | Payer: MEDICARE

## 2023-09-17 DIAGNOSIS — I10 Essential (primary) hypertension: Secondary | ICD-10-CM

## 2023-09-17 DIAGNOSIS — Z953 Presence of xenogenic heart valve: Secondary | ICD-10-CM

## 2023-09-19 ENCOUNTER — Encounter: Admit: 2023-09-19 | Discharge: 2023-09-19 | Payer: MEDICARE

## 2023-09-24 ENCOUNTER — Encounter: Admit: 2023-09-24 | Discharge: 2023-09-24 | Payer: MEDICARE

## 2023-09-24 DIAGNOSIS — I2510 Atherosclerotic heart disease of native coronary artery without angina pectoris: Secondary | ICD-10-CM

## 2023-09-24 DIAGNOSIS — E7800 Pure hypercholesterolemia, unspecified: Secondary | ICD-10-CM

## 2023-09-24 DIAGNOSIS — I159 Secondary hypertension, unspecified: Secondary | ICD-10-CM

## 2023-09-24 MED ORDER — ROSUVASTATIN 20 MG PO TAB
20 mg | ORAL_TABLET | Freq: Every day | ORAL | 3 refills | 90.00000 days | Status: AC
Start: 2023-09-24 — End: ?

## 2023-09-26 ENCOUNTER — Encounter: Admit: 2023-09-26 | Discharge: 2023-09-26 | Payer: MEDICARE

## 2023-09-26 ENCOUNTER — Ambulatory Visit: Admit: 2023-09-26 | Discharge: 2023-09-26 | Payer: MEDICARE

## 2023-09-26 ENCOUNTER — Ambulatory Visit: Admit: 2023-09-26 | Discharge: 2023-09-27 | Payer: MEDICARE

## 2023-09-26 DIAGNOSIS — R0989 Other specified symptoms and signs involving the circulatory and respiratory systems: Secondary | ICD-10-CM

## 2023-09-26 DIAGNOSIS — I4729 NSVT (nonsustained ventricular tachycardia) (CMS-HCC): Secondary | ICD-10-CM

## 2023-09-26 DIAGNOSIS — M79604 Pain in right leg: Secondary | ICD-10-CM

## 2023-09-26 DIAGNOSIS — Z952 Presence of prosthetic heart valve: Secondary | ICD-10-CM

## 2023-09-26 DIAGNOSIS — I442 Atrioventricular block, complete: Secondary | ICD-10-CM

## 2023-09-26 DIAGNOSIS — I159 Secondary hypertension, unspecified: Secondary | ICD-10-CM

## 2023-09-26 DIAGNOSIS — I495 Sick sinus syndrome: Secondary | ICD-10-CM

## 2023-09-26 MED ORDER — BISOPROLOL FUMARATE 5 MG PO TAB
5 mg | ORAL_TABLET | Freq: Two times a day (BID) | ORAL | 3 refills | 90.00000 days | Status: AC
Start: 2023-09-26 — End: ?

## 2023-09-26 NOTE — Progress Notes
 Date of Service: 09/26/2023    Mason Lawson is a 78 y.o. male.       HPI    The patient is a 78 year old male with a dual chamber pacemaker who presents for a routine follow-up visit. He was referred by Dr. Justice Britain for non-sustained VT after TAVR and pacemaker check.    He has a history of non-sustained ventricular tachycardia (VT) and atrial standstill with no atrial activity. He sometimes feels symptomatic, stating, 'I don't know, ever know if it's my stomach or my heart.'    He underwent tricuspid isthmus ablation for atrial flutter in July 2022 and had a dual chamber pacemaker implanted in the same month. A Watchman implant was placed on February 20, 2022, with a follow-up TEE on April 05, 2022, showing no device leak. He also had a TAVR on June 04, 2023.    He experienced chest pain in January and February 2025, particularly when walking to the [facility] for swimming, which would resolve after swimming. He also experienced chest pain after naps, which has not occurred in the past month. No recent dizziness or lightheadedness.    His current medications include amlodipine 5 mg, aspirin 81 mg, bisoprolol 2.5 mg daily, Repatha injections, ezetimibe, Lasix 20-40 mg as directed, losartan 100 mg daily, magnesium, metformin for pre-diabetes, potassium, and rosuvastatin.          VITALS: P- 63, BP- 130/73, SaO2- 94%  MEASUREMENTS: BMI- 29.5.  NECK: Neck veins flat.  CHEST: Lungs clear to auscultation. Surgical scar from CABG surgery in 2005 on chest.  CARDIOVASCULAR: Regular heart sounds with grade 2 systolic murmur.  EXTREMITIES: No edema in extremities.           LABS  Left atrial pressure: 24/10 mmHg (02/20/2022)    RADIOLOGY  CT heart: Good healing on Watchman, appropriate position, no blood clot, no leaks (03/26/2023)    DIAGNOSTIC  ECG: Atrial and ventricular sequential pacing, 67 bpm, pacing in right ventricular apex, negative concordance, left bundle branch block-like morphology, negative in V1-V6, superior axis, positive in I and AVL, paced QRS duration 172 ms (09/26/2023)  TEE: Well-seated Watchman device, no device leak, moderate to severe left atrial dilation (04/05/2022)  TAVR: Successful transcatheter aortic valve replacement, 29 mm Sapien S3, no paravalvular regurgitation, no central valve regurgitation (06/04/2023)  Echocardiogram: Normal left ventricular size, normal left ventricular wall thickness, mildly reduced left ventricular systolic function, LVEF 50%, apical septal abnormal motion, paced ventricular rhythm, segmental wall motion abnormalities, normal right ventricular size, mildly reduced right ventricular function (07/18/2023)        Assessment  Status post Watchman Flx 31 mm left atrial appendage occluder device implant February 20, 2022.    Status post cavotricuspid isthmus ablation January 21, 2021.  Device detected episodes of 2-3 seconds of nonsustained ventricular tachycardia.  Sinus arrest.  Complete heart block.  Dual-chamber pacemaker since January 21, 2021.  Chronic RV pacing.  Heart failure with preserved ejection fraction.  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  Coronary artery disease status post CABG 2005.  Obstructive sleep apnea, not using CPAP.  Peripheral arterial disease.  Diabetes mellitus type 2.  Hypertension.  CHA2DS2-VASc score 5 (hypertension, diabetes, vascular disease, age x2).      Plan    Non-sustained ventricular tachycardia (VT)  Episodes of non-sustained VT with heart rates reaching the 160s, lasting three seconds or less, and somewhat symptomatic. Pacemaker cannot address these rapid heartbeats if sustained. Current heart function is stable, and  episodes have not warranted a defibrillator. Increasing bisoprolol is preferred to manage heart rhythm disturbances.  - Increase bisoprolol to 5 mg twice daily  - Stop amlodipine  - Monitor blood pressure at home  - Follow up with a nurse phone call in 1-2 weeks to assess blood pressure  - Consider adding back amlodipine if blood pressure remains high    Complete heart block with pacemaker dependency  Complete heart block with dependency on a dual chamber pacemaker. Pacemaker is functioning well with a battery life of over eight years. Atrial and ventricular pacing with no intrinsic atrial activity. Current setup is not the most efficient, potentially leading to heart failure, but heart function is only mildly impaired. No immediate changes to the pacemaker system are necessary. If heart function worsens, cardiac resynchronization therapy with a third wire may be considered.  - Continue current pacemaker settings  - Monitor heart function for potential future need for cardiac resynchronization therapy    Paroxysmal atrial fibrillation  Paroxysmal atrial fibrillation on anticoagulation therapy with Eliquis. Watchman device implanted in August 2023, follow-up imaging shows it is well-seated with no leaks.  - Continue daily baby aspirin    Heart failure with preserved ejection fraction (HFpEF)  Heart failure with preserved ejection fraction. Current echocardiogram shows mildly reduced left ventricular systolic function with an ejection fraction of 50%. Managing well with no significant symptoms of heart failure.  - Continue current heart failure management    Hypertension  Blood pressure is somewhat elevated with fluctuations. Current regimen includes losartan, bisoprolol, and amlodipine. Decision made to optimize bisoprolol for its benefits on heart rhythm disturbances and discontinue amlodipine.  - Increase bisoprolol to 5 mg twice daily  - Stop amlodipine  - Monitor blood pressure at home  - Follow up with a nurse phone call in 1-2 weeks to assess blood pressure  - Consider adding back amlodipine if blood pressure remains high    Status post transcatheter aortic valve replacement (TAVR)  Underwent TAVR on June 04, 2023, with a 29 mm Sapien S3 valve. Post-procedure chest pain has resolved. Valve is functioning well with no regurgitation.  - Continue aspirin long term  - Monitor for any new symptoms    Status post coronary artery bypass grafting (CABG)  Underwent CABG in 2005. No current issues related to the CABG, and condition is stable.  - Continue current management    Peripheral arterial disease  Peripheral arterial disease with no new symptoms or concerns.  - Continue current management    Pre-diabetes  Managing pre-diabetes with metformin. No new concerns regarding blood sugar control.  - Continue metformin  - Monitor blood sugar levels     EP APP follow-up in 6 months.        Current Medications (including today's revisions)   aspirin EC (ASPIR-LOW) 81 mg tablet Take one tablet by mouth daily. Indications: s/p Watchman    bisoprolol fumarate (ZEBETA) 5 mg tablet Take one tablet by mouth twice daily.    calcium carbonate (TUMS) 500 mg (200 mg elemental calcium) chewable tablet Chew one tablet by mouth daily as needed.    CHOLEcalciferoL (vitamin D3) (VITAMIN D3) 50 mcg (2,000 unit) tablet Take one tablet by mouth every 48 hours.    coenzyme Q10 100 mg cap Take one capsule by mouth three times weekly. Monday, Wednesday and Friday.    Cranberry 500 mg cap Take one capsule by mouth daily.    cyanocobalamin (vitamin B-12) 1,000 mcg tablet Take one tablet by  mouth every 48 hours.    evolocumab (REPATHA SURECLICK) 140 mg/mL injectable PEN Inject 1 mL under the skin every 14 days.    ezetimibe (ZETIA) 10 mg tablet TAKE 1 TABLET EVERY DAY    ferrous sulfate (FEOSOL) 325 mg (65 mg iron) tablet Take one tablet by mouth three times weekly. Take on an empty stomach at least 1 hour before or 2 hours after food. Takes on Monday, Wednesday and Friday    furosemide (LASIX) 40 mg tablet Take 1/2-1 tablet by mouth daily as directed for swelling and weight gain.    glucosamine/chondro su A/C/Mn (GLUCOSAMINE-CHONDROITIN COMPLX PO) Take 1 tablet by mouth daily.    levothyroxine (SYNTHROID) 175 mcg tablet Take one tablet by mouth daily 30 minutes before breakfast.    losartan (COZAAR) 100 mg tablet Take one tablet by mouth daily. Indications: high blood pressure    magnesium glycinate 100 mg magnesium capsule Take one capsule by mouth every 48 hours.    metFORMIN (GLUCOPHAGE) 500 mg tablet Take one tablet by mouth twice daily with meals. Ok to resume 06/06/23  Indications: type 2 diabetes mellitus    omeprazole DR(+) (PRILOSEC) 20 mg PO capsule Take one capsule by mouth daily.    potassium chloride SR (KLOR-CON M20) 20 mEq tablet Take one tablet by mouth daily. Take while taking furosemide.  Indications: prevention of low potassium in the blood    rosuvastatin (CRESTOR) 20 mg tablet TAKE 1 TABLET EVERY DAY    vitamins, multiple tablet Take one tablet by mouth every 48 hours.      Vitals:    09/26/23 0951   BP: 130/73   BP Source: Arm, Left Upper   Pulse: 63   SpO2: 94%   O2 Device: None (Room air)   PainSc: Zero   Weight: 97.2 kg (214 lb 3.2 oz)   Height: 181.6 cm (5' 11.5)   Body mass index is 29.46 kg/m?[name]       Past Medical History  Patient Active Problem List    Diagnosis Date Noted    Stage 1 mild COPD by GOLD classification (CMS-HCC) 06/04/2023    S/p TAVR (transcatheter aortic valve replacement), bioprosthetic 06/04/2023     06/04/23: Dr. Ky Barban and Dr. Argie Ramming       Bilateral carotid artery stenosis 05/30/2023    Atherosclerosis of aorta 05/29/2023    Former smoker 05/29/2023    Aortic valve disease 03/26/2023    Acute and chronic respiratory failure with hypoxia (CMS-HCC) 12/05/2022    Coronary artery disease due to lipid rich plaque 06/22/2022    Presence of Watchman left atrial appendage closure device 02/21/2022     02/20/2022 - Dr. Milas Kocher; Left atrial appendage occlusion with 31 mm Watchman FLX device      PAF (paroxysmal atrial fibrillation) (CMS-HCC) 02/20/2022    Cardiac pacemaker 05/16/2021     01/21/2021 - Successful Dual-chamber Pacemaker Implantation with Dr. Derrell Lolling      Atrial fibrillation (CMS-HCC) 05/13/2021    Bradycardia 02/01/2021 DM (diabetes mellitus) type II, controlled, with peripheral vascular disorder (CMS-HCC) 02/01/2021    Paroxysmal nocturnal dyspnea 02/01/2021    Atrial flutter (CMS-HCC) 01/19/2021     01/21/2021 - AFL RFA with Dr. Wallene Huh:  Successful cavo-tricuspid isthmus ablation.  Comprehensive EP study with coronary sinus catheter placement.  Underlying severe sinus node dysfunction. No escape rhythm.      Chronic diastolic heart failure, NYHA class 2 (CMS-HCC) 12/04/2018     RIGHT HEART CATHETERIZATION:  06/2018 in the setting of  severe hypertension  HEMODYNAMICS:   Blood pressure 198/81 with a mean of 130 mmHg. Heart rate 50 beats per minute.   PRESSURES:  The right atrial pressure was 4 mmHg. Right ventricular pressure was 64/8 mmHg.  The pulmonary artery pressure was 60/70 mmHg with a mean of 32 mmHg. The pulmonary capillary wedge pressure was 17 mmHg. The transpulmonary gradient was 15 mmHg.  The diastolic pulmonary gradient was 0 mmHg. The pulmonary vascular resistance was 2 Wood units.   RIGHT SATURATIONS: The right atrial saturation 77%. The pulmonary artery saturation was 75%. The aortic saturation was 96%.   OUTPUTS: The cardiac output by Fick method was 5.8 L/minute with a cardiac index of 2.7 L/minute per sq m. The cardiac index by thermodilution was 7.3 with a cardiac index of 3.4 L/minute per sq m.      Dyslipidemia 10/23/2012    Chest pain 09/26/2010    Abnormal stress test 09/26/2010    Murmur 09/23/2010    Coronary artery disease      A. Onset mid 2004, progression fall 2005, patient reduces activity at work to avoid symptoms  B. 12/05 Progressive angina, Dr. Duanne Limerick, then 06/29/04 CBP Atch OV, referred for cath          >12/29 Cath 100%RCA, 75%LAD&Cx,50%LM EF 55%          >07/01/05 CABGx3 LIMA-LAD w/ Y-RIMA-OM, Rad-PDA per Dr. Ky Barban  C. 06/29/08 exercise echocardiographic images demonstrated basal septal ischemia, significant hypertensive response to exercise, good and adequate exercise tolerance with no angina.  D. LHC 06/2018 for new heart failure symptoms: LEFT HEART CATHETERIZATION:  HEMODYNAMICS:  The aortic pressure was- 195/63 with a mean of 105 mmHg .  The left ventricular systolic pressure was 210 mmHg.  There was a peak to peak gradient of 15 mmHg.  Left main coronary artery:  Left main coronary artery arises normally from the left coronary sinus.  The left main coronary artery bifurcates into left anterior descending and left circumflex artery and is free of angiographically significant disease.  Left anterior descending:  Left anterior descending is a large vessel which gives rise to- 3 diagonal vessels. 1 diagonal has a 80% ostial stenosis, the rest of diagonal vessels do not appear to be angiographically significant. Distal LAD has competitive flow from the LIMA.   Left circumflex artery:  The left circumflex artery appears to be medium caliber and has a 100% stenosis proximally. The mid-to-distal portion of the vessel fills from bridging collaterals from proximal portion and is diffusely disease. Three obtuse marginal branches arise, which are also diffusely diseased.   Right coronary artery:  The right coronary artery is a medium caliber vessel, but has 100% chronic total occlusion after the takeoff of conus branch. The PDA fills retrogradely from the radial graft to supply the PLV and has mild plaquing in the native PDA.    BYPASS GRAFT ANATOMY:  1.The LIMA to LAD was visualized using IM catheter.  The ostium, body was noted to be free of any angiographic disease, but has a 80% stenosis at the anastamosis with diffusely diseased distal LAD. There is a Y jump graft from the LIMA to the OM branch which fills retrogradely.            2. The radial artery graft to the PDA appears to be without any angiographically significant disease at ostium, body or anastomotic site.   E. Regadenosom MPI 11/2018 TID Ratio:  1.05  (normal <1.36). Summed Stress Score:  3   ,  Summed Rest Score:  0 Quantitative polar maps show a statistically insignificant amount of inferoapical ischemia. Mild inferobasilar septal hypokinesis.       Left Ventricular Ejection Fraction (post stress, in the resting state) =  52 %.  Left Ventricular End Diastolic Volume: 454 mL       SUMMARY/OPINION:   This study is borderline abnormal with limited reversible uptake in the inferior wall.  All segments are viable.  Global left ventricular function is within normal limits. In aggregate the current study is low risk in regards to predicted annual cardiovascular mortality rate.         Hypercholesterolemia      A.  02/28/01 total 324 total 249 HDL 43 LDL 231 Pt deferred meds  B. 06/29/04 total 273 trig 193 HDL 36 LDLd206 Zocor begun dc'd by pt d/t statinphobia and cost  C.  2/24 Rx Vytorin 10/80 1/2 tab.      Hypothyroidism      A.  2004 presentation with TSH over 200, Rx w l-thyroxine      Hypertension      A.  2/06 Altace 5 dc'd w/ dysgeusia, Metoprolol 25 BID begun         Tests ordered      A. 07/17/08 carotid artery duplex scan: no significant stenosis         Review of Systems   Constitutional: Negative.   HENT: Negative.     Eyes: Negative.    Cardiovascular: Negative.    Respiratory: Negative.     Endocrine: Negative.    Hematologic/Lymphatic: Negative.    Skin: Negative.    Musculoskeletal:  Positive for muscle weakness.   Gastrointestinal: Negative.    Genitourinary: Negative.    Neurological:  Positive for weakness.   Psychiatric/Behavioral: Negative.     Allergic/Immunologic: Negative.        Cardiovascular Health Factors    Vitals  BP Readings from Last 5 Encounters:   09/26/23 130/73   07/18/23 121/62   07/18/23 120/62   06/05/23 (!) 144/62   05/30/23 114/42     Wt Readings from Last 5 Encounters:   09/26/23 97.2 kg (214 lb 3.2 oz)   07/18/23 98.2 kg (216 lb 9.6 oz)   07/18/23 98 kg (216 lb)   06/05/23 97.7 kg (215 lb 6.2 oz)   05/30/23 100.7 kg (222 lb)     BMI Readings from Last 2 Encounters:   09/26/23 29.46 kg/m?   07/18/23 29.79 kg/m?      Smoking/Alcohol  Social History     Tobacco Use   Smoking Status Former    Current packs/day: 0.00    Average packs/day: 1 pack/day for 40.0 years (40.0 ttl pk-yrs)    Types: Cigarettes    Start date: 06/02/1964    Quit date: 06/02/2004    Years since quitting: 19.3   Smokeless Tobacco Never     Alcohol Use: Not At Risk (06/10/2023)    Alcohol Use     Alcohol Use: No     Male: 9+ ounces (15+ Standard Drinks) per week Threshold: 6     Male: 4.8+ ounces (8+ Standard Drinks) per week Threshold: Not on file   Recent Concern: Alcohol Use - Alcohol Misuse (05/21/2023)    Alcohol Use     Alcohol Use: Yes     Male: 9+ ounces (15+ Standard Drinks) per week Threshold: 10.8     Male: 4.8+ ounces (8+ Standard Drinks) per week Threshold: Not on file  Lipid Profile  CHOL   Date Value   03/27/2023 71 MG/DL   96/10/5407 72     TRIG   Date Value   03/27/2023 124 MG/DL   81/19/1478 93     HDL   Date Value   03/27/2023 42 MG/DL   29/56/2130 41     LDL   Date Value   03/27/2023 21 mg/dL   86/57/8469 13      Blood Glucose  GLU   Date Value   07/19/2023 145   06/05/2023 132 mg/dL     GLUPOC (mg/dL)   Date Value   62/95/2841 171   06/05/2023 129     HGBA1C   Date Value   03/26/2023 6.7 %   04/12/2021 6.8        Blood Counts  WBC   Date Value   07/19/2023 12.18   06/05/2023 13.00 10*3/uL     HGB   Date Value   07/19/2023 15.6   06/05/2023 14.7 g/dL     HCT   Date Value   07/19/2023 46.9   06/05/2023 42.7 %     PLTCT   Date Value   07/19/2023 224   06/05/2023 148 10*3/uL      Metabolic Panel  Lab Results   Component Value Date    NA 143 07/19/2023    K 4.2 07/19/2023    MG 1.7 06/05/2023    CA 9.6 07/19/2023    CO2 23.0 07/19/2023    GAP 11 07/19/2023    BUN 16.1 07/19/2023    CR 0.82 07/19/2023    CR 0.66 06/05/2023    CR 0.84 06/04/2023    GFR 91.0 07/19/2023    GFRAA >60 06/18/2018    ALBUMIN 4.2 05/30/2023    TOTPROT 6.7 05/30/2023    ALKPHOS 71 05/30/2023    AST 24 05/30/2023    ALT 28 05/30/2023    TOTBILI 0.5 05/30/2023        Coagulation  INR (no units)   Date Value   06/04/2023 1.2   05/30/2023 1.0     PTT (Seconds)   Date Value   06/04/2023 32.4   05/30/2023 30.5      Thyroid Profile  TSH   Date Value   03/26/2023 0.62 MCU/ML   11/01/2022 0.61        Cardiac Markers  HIGHSTROPI (ng/L)   Date Value   03/27/2023 65   03/27/2023 66     HSTROP0HR (ng/L)   Date Value   03/26/2023 49     HSTROP2HR (ng/L)   Date Value   03/26/2023 54     HSTROP4HR (ng/L)   Date Value   03/26/2023 57     NTPROBNP   Date Value   07/19/2023 516   05/30/2023 535 pg/mL      Left Ventriclar Ejection Fraction  Lab Results   Component Value Date    ECHOEF 60 06/05/2023    ECHOEF 65 02/21/2022    ECHOEF 65 01/20/2021    ECHOEF 65 10/08/2019    ECHOEF 65 06/07/2018    ECHOEF 60 05/03/2017    ECHOEF 55 09/26/2010    SBEF 45 07/18/2023    SBEF 57 06/05/2023    SBEF 65 03/26/2023    SBEF 75 07/25/2021    SBEF 61 10/08/2019    MPIEF 45 06/01/2022    MPIEF 52 11/13/2018    MPIEF 43 09/13/2010          I spent over 40 minutes on  the date of service related to this patient's care.

## 2023-09-26 NOTE — Patient Instructions
 Increase bisoprolol to 5mg  twice daily.    STOP amlodipine.     Please keep blood pressure log for 1-2 weeks. Please check blood pressure once daily, 1.5-2 hours after taking your bisoprolol in the morning and write down your blood pressure and heart rate as well as the date and time. Send [place] a mychart message in 1-2 weeks with the log. Target is 130/85.     Follow-Up:    -Thank you for allowing me to take care of you today. My name is Romilda Joy, Charity fundraiser.    -We would like you to follow up in   6 months with Sharman Cheek, APRN   The schedule is released approximately 4-5 months in advance. You should be called or mailed to make an appointment, however if you would like to call us to make this appt, please call [phone].      -You will receive a survey in the upcoming week from [facility] [address]. Your feedback is important to [place], and helps [place] continue to improve patient care and patient satisfaction.       Contacting our office:    -Business Hours: Monday-Friday, 8:00 am-4:30 pm (excluding Holidays).     -For medical questions or concerns, please send [place] a message through your MyChart account or call the [facility] nursing triage line at [phone]. Please leave a detailed message with your name, date of birth, and reason for your call.  If your message is received before 3:30pm, every effort will be made to call you back the same day.  Please allow time for [place] to review your chart prior to call back.     -For medication refills please start by contacting your pharmacy. You can also send [place] a prescription question through your MyChart or call the nurse triage line above.     -Our fax number is [phone].    Results & Testing Follow Up:    -Please allow 10-15 business days for the results of any testing to be reviewed. Please call our office if you have not heard from a nurse within this time frame.    -Should you choose to complete testing at an outside facility, please contact our office after completion of testing so that we can ensure that we have received results.    Lab and test results:  As a part of the CARES act, starting 10/02/2019, some results will be released to you via mychart immediately and automatically.  You may see results before your provider sees them; however, your provider will review all these results and then they, or one of their team, will notify you of result information and recommendations.   Critical results will be addressed immediately, but otherwise, please allow [place] time to get back with you prior to you reaching out to [place] for questions.  This will usually take about 72 hours for labs and 5-7 days for procedure test results.      We know you have a choice and want to thank you for choosing [facility].

## 2023-09-30 ENCOUNTER — Encounter: Admit: 2023-09-30 | Discharge: 2023-09-30

## 2023-10-05 ENCOUNTER — Encounter: Admit: 2023-10-05 | Discharge: 2023-10-05

## 2023-10-05 ENCOUNTER — Ambulatory Visit: Admit: 2023-10-05 | Discharge: 2023-10-05

## 2023-10-06 ENCOUNTER — Encounter: Admit: 2023-10-06 | Discharge: 2023-10-06

## 2023-10-07 ENCOUNTER — Encounter: Admit: 2023-10-07 | Discharge: 2023-10-07

## 2023-10-08 ENCOUNTER — Encounter: Admit: 2023-10-08 | Discharge: 2023-10-08

## 2023-10-09 ENCOUNTER — Encounter: Admit: 2023-10-09 | Discharge: 2023-10-09

## 2023-10-09 MED FILL — REPATHA SURECLICK 140 MG/ML SC PNIJ: 140 mg/mL | SUBCUTANEOUS | 28 days supply | Qty: 2 | Fill #5 | Status: AC

## 2023-10-13 ENCOUNTER — Encounter: Admit: 2023-10-13 | Discharge: 2023-10-13 | Payer: MEDICARE

## 2023-10-16 ENCOUNTER — Encounter: Admit: 2023-10-16 | Discharge: 2023-10-16 | Payer: MEDICARE

## 2023-10-16 ENCOUNTER — Inpatient Hospital Stay: Admit: 2023-10-16 | Discharge: 2023-10-16 | Payer: MEDICARE

## 2023-10-16 DIAGNOSIS — E7800 Pure hypercholesterolemia, unspecified: Secondary | ICD-10-CM

## 2023-10-16 DIAGNOSIS — E039 Hypothyroidism, unspecified: Secondary | ICD-10-CM

## 2023-10-16 DIAGNOSIS — I739 Peripheral vascular disease, unspecified: Secondary | ICD-10-CM

## 2023-10-16 DIAGNOSIS — I6521 Occlusion and stenosis of right carotid artery: Secondary | ICD-10-CM

## 2023-10-16 DIAGNOSIS — Z953 Presence of xenogenic heart valve: Secondary | ICD-10-CM

## 2023-10-16 DIAGNOSIS — Z9889 Other specified postprocedural states: Secondary | ICD-10-CM

## 2023-10-16 NOTE — Telephone Encounter
 The following information discussed with patient and sent via mychart message.    Surgery/ Procedure date: Wednesday, May 14th    Check-in time: You will receive a call from the surgical team the day prior to your surgery after 2 pm, to inform  you of what time you will need to arrive in admitting the day of your surgery.  If you have not been contacted by 4:30 pm the day prior to your surgery date, please call [phone] or [phone].. The pre-anesthesia team will provide you with the exact check in time.      Location: Check in at [facility] main campus. Address: [address] [place]  [place], [place] [address]. You can park in the P3 parking garage, located directly across from the main entrance of the hospital ([address], [place]  [place], [place] [address]), go through the main entrance, and on the left hand side next to the gift shop is admissions; this is where you will check-in on the day of surgery. You can validate your ticket at the admissions desk.     Eating or drinking before surgery     Nothing to eat or drink after 12:00AM (midnight) day of your surgery. This includes gum, mints and candy.     YOUR MEDICATION INSTRUCTIONS FOR SURGERY   Take your morning blood pressure and heart pills as prescribed with a sip of water. IF you take insulin: Hold all regular insulins that morning and if you take lantus at bedtime normally, take 1/3rd of the dose instead of the full dose night before surgery. Hold oral blood sugar lowering medications as well in the morning. If you receive a call from anesthesia to further review your medications, please follow their instructions.     BLOOD THINNER instructions:    You are currently taking: Aspirin-Do NOT stop prior to surgery/ procedure      If you are on the following medications (GLP-1 Agonist), hold the medication 7 days prior to surgery:   Dulaglutide (Trulicity)   Exenatide extended release (Bydureon bcise)   Exenatide (Byetta) Semaglutide (Ozempic)   Liraglutide (Victoza, Saxenda)   Lixisenatide (Adlyxin)   Semaglutide (Rybelsus)     If you are on the following medications (SGLT2 inhibitor), hold the medication 3 days prior to surgery:  Brenzavvy (bexaglifloxin)  Invokana (canagliflozin)  Farxiga (dapagliflozin)  Jardiance (empagliflozin)  Steglatro (ertugliflozin)         Administrator, arts if:  You become ill with a cough, fever, sore throat, nausea, vomiting or flu-like symptoms.  You have any open wounds/sores that are red, painful, draining, or are new since you last saw the doctor.  You need to cancel your procedure.  You are going to be late.    Please follow the Hibiclens bathing protocol below (see instructions below).     For any questions please don't hesitate to call Chloe Baig RN at [phone] or [phone]        Having Carotid Endarterectomy  Endarterectomy is the removal of plaque from the carotid artery through a cut (incision) in the neck. This surgery has a low risk for stroke or complication (1% to 3%). It typically involves a quick recovery with little pain. You may be asleep under general anesthesia during surgery. Or you may be awake with local anesthesia to control pain. Your surgeon will discuss this with you before surgery.      How the endarterectomy is done     A skin incision is made over the  carotid artery.        A shunt helps keep blood flowing during the procedure.   Make the skin incision. The surgeon makes a cut (incision) in the skin over the carotid artery.  Open the artery. The surgeon places clamps on the artery above and below the blockage. This temporarily stops blood flow. The brain receives blood from the carotid artery on the other side of your neck. The surgeon then makes an incision in the artery itself.  Place the shunt. A shunt may be used to preserve blood flow to the brain during the procedure. After the shunt is in place, the clamps are removed from the internal carotid artery. In some cases a shunt is not needed because the brain is receiving enough blood through the carotid artery on the other side of your neck.   Remove plaque. The surgeon loosens plaque from the artery wall. The plaque is then removed, often in a single piece. The surgeon looks at the artery to confirm that all of the plaque has been removed.  Close the incision. The surgeon closes the incision using either sutures or a patch. The clamps are then removed. Next, the skin incision is sutured closed. A tube or drain may be put in place to keep fluids from collecting around the area.  The surgery usually takes around 2 hours. But it may take longer depending on the anesthesia and your situation.      Risks and possible complications  The risks of this procedure include:  TIA or stroke  Bleeding at the incision site  Headache  Bleeding into the brain  Seizure  Heart attack  Nerve injury leading to temporary or permanent hoarseness, numbness, or swallowing problems  Death      Discharge Instructions for Carotid Endarterectomy-TUKH  Your doctor performed surgery called a carotid endarterectomy. This is the most common way to restore normal blood flow through the vessels that carry blood to your brain. These vessels are called the carotid arteries. During the surgery, a surgeon made a small incision in the side of your neck, just below your jaw. The artery was opened, and the blockage was cleared. This procedure was done to reduce your risk of a stroke, which can occur when the carotid arteries are severely blocked or narrowed.        Ask a friend or family member to help with chores, especially those that involve lifting.   Home care  Spend your first few days after surgery relaxing at home. It's OK to do quiet activities such as reading or watching TV.  Take your medications exactly as instructed. Don't skip doses.  Don't drive until your doctor says it's OK. This will most likely take 1 to 2 weeks.  Keep the wound dry.  It is ok to shower, use soap and water  to clean the incision. Don't scrub your incision.  Avoid strenuous activity for 7 to 10 days after your surgery.  Don't lift anything heavier than 10 pounds for 2 to 3 weeks after your surgery.  Ask your doctor when you can expect to return to work.  Shave carefully around your incision. You may want to use an Neurosurgeon.  Gradually increase your activity. It may take some time for you to return to your normal activities.  Check your incision every day for signs of infection (redness, swelling, drainage, or warmth).  Don't be alarmed if you have some loss of feeling along your jaw line, the incision line,  and earlobe. This is a result of the incision and usually goes away after 6 to 12 months.  Long-term changes at home  Eat a healthy, low-fat, low cholesterol, and low-calorie diet. Ask your doctor for menus and other diet information.  Maintain your ideal body weight.  After you have recovered from surgery, try to exercise more, especially walking. Ask your doctor for guidance.  If you smoke ask your doctor for help quitting.   Follow-up care  Make a follow-up appointment with your doctor as directed.     When to call your doctor  Call your doctor immediately if you have any of the following (or go to the emergency room if your doctor's office is closed):  Neck swelling  Headache, particularly if it does not go away after a couple of hours  Redness, pain, swelling, or drainage from your incision  Fever above 100?F (37.7?C)  Numbness or weakness in your face, arms, or legs  Sudden changes in your vision  Loss of vision in 1 eye  Trouble speaking  Trouble breathing  Trouble swallowing       Chlorhexidine gluconate (CHG) Pre-Surgery Shower     Bathe with a chlorhexidine gluconate (CHG) liquid soap only if told to do so by a health care provider.  Shower with this soap the DAY DEFORE and the MORNING OF your surgery.   You should use 1/2 of the bottle with each shower.  This mild soap reduces the number of germs on your skin that could cause an infection.  Do not use the chlorhexidine soap if you are allergic to this product. Instead, you should use Dial Anti-bacterial soap.   Do not apply soap to any open wound.        1. Shampoo your hair, wash your face, and clean your genital area with the soap you normally use.       Wash hair with regular shampoo.      2.  Turn water off or step out of the shower spray while applying the special soap.  This prevents the soap from rinsing off too soon. From the neck down, apply the chlorhexidine soap with a clean washcloth. Do not apply this soap near eyes, ears, or on genital area. This product can cause blindness or hearing loss if used on eyes or ears.     3. Do not shave surgical area.        Use clean washcloth to wash skin below the neck.     4.  Wash your body gently for 2 minutes. Leaving the soap on your skin is important because it takes this amount of time to remove germs fully. Pay special attention to the area where you will have your surgery.  Do not wash with your regular soap after the chlorhexidine soap is used.  5.  After this soap has been on your skin for 2 minutes, rinse your body thoroughly.        Rinse your body thoroughly.      6.  Pat yourself dry with a clean, soft towel.  7. This soap can be a little drying to the skin and may cause some itching.  This is a temporary side effect and usually goes away after your surgery when you resume your normal hygiene routine.  7.  Do not put products such as lotion, deodorant, powder, or perfume/aftershave on your hair, face or skin after your shower.         No Products  8.  Put on freshly laundered clothes and sleep on freshly laundered linens.   9.  Wear freshly laundered clothes to the hospital the day of surgery.     You can purchase the liquid chlorhexidine soap over the counter at most large drugstores including [facility], [facility] and [facility]. A prescription is not needed, but you may ask your pharmacist for assistance locating it in the store.  A common brand name for this soap is Hibiclens, but any brand is acceptable to use.        ? 2000-2022 [employer], [place]. All rights reserved. This information is not intended as a substitute for professional medical care. Always follow your healthcare professional's instructions.  ? 2000-2016 [employer] [address], [place], [place] [address]. All rights reserved. This information is not intended as a substitute for professional medical care. Always follow your healthcare professional's instructions. This information has been modified by your health care provider with permission from the publisher.

## 2023-10-17 ENCOUNTER — Encounter: Admit: 2023-10-17 | Discharge: 2023-10-17 | Payer: MEDICARE

## 2023-10-17 NOTE — Pre-Anesthesia Patient Instructions
 PREPROCEDURE INFORMATION    Arrival at the hospital  [facility]  [address]  [place], [place] [address]    Park in the [employer], located directly across from the main entrance to the hospital.  Enter through the ground floor main hospital entrance and check in at the Information Desk in the lobby.  They will validate your parking ticket and direct you to the next location.  If you are a woman between the ages of 57 and 20, and have not had a hysterectomy, you will be asked for a urine sample prior to surgery.  Please do not urinate before arriving in the Surgery Waiting Room.  Once there, check in and let the attendant know if you need to provide a sample.  You will receive a call with your surgery arrival time between 2:30pm and 4:30pm the last business day before your procedure.  If after 4:30 pm you have not received your arrival time, please call [phone].    *IMPORTANT* Eating and drinking instructions before surgery  Nothing to eat after 11:00pm the night before your surgery - including gum, mints, and hard candy. You may have clear liquids up to 2 hours before your arrival time.     Clear liquid examples include:  Water  Clear juice (apple or cranberry - no pulp or orange juice)  Black coffee and plain tea (sugar/artificial sweetener only - add nothing else)  Sports drink (Powerade/Gatorade)  Carbonated Beverages (Sprite, Cola)  Pedialyte  Bowel Prep (if ordered by physician)    Planning transportation for outpatient procedure  For your safety, you will need to arrange for a responsible ride/person to accompany you home due to sedation or anesthesia with your procedure.  An [employer], taxi or other public transportation driver is not considered a responsible person to accompany you home.    Bath/Shower Instructions  Take a bath or shower with antibacterial soap the night before and the morning of your procedure. Use clean towels.  Put on clean clothes after bath or shower.  Avoid using lotion and oils.  If you are having surgery above the waist, wear a shirt that fastens up the front.  Sleep on clean sheets if bath or shower is done the night before procedure.  Follow the instructions you received from your surgeon.    Morning of your procedure:  Brush your teeth and tongue  Do not smoke, vape, chew or use any tobacco products.  Do not shave the area where you will have surgery.  Remove nail polish, makeup and all jewelry (including piercings) before coming to the hospital.  Dress in clean, loose, comfortable clothing.    Valuables  Leave money, credit cards, jewelry, and any other valuables at home. If medications are being filled and picked up at a [facility] pharmacy, payment will be needed. [facility] is not responsible for the loss or breakage of personal items.    What to bring to the hospital  ID/Insurance card  Medical Device card  Official documents for legal guardianship  Copy of your Living Will, Advanced Directives, and/or Durable Power of Attorney. If you have these documents, please bring them to the admissions office on the day of your surgery to be scanned into your records.  Do not bring medications from home unless instructed by a pharmacist.  Otho Blitz, cane, or motorized scooter  Cases for glasses/hearing aids/contact lens (bring solutions for contacts)  Stimulator remote     Notify your surgeon if:  There is a possibility that you are pregnant.  You become ill with a cough, fever, sore throat, nausea, vomiting or flu-like symptoms.  You have any open wounds/sores that are red, painful, draining, or are new since you last saw the doctor.  You need to cancel your procedure.    Preparing to get your medications at discharge  Your surgeon may prescribe you medications to take after your procedure.  If you like the convenience of having your medications filled here at [place], please do the following:  Go to [facility] after your [facility] appointment to put a credit card on file.  Bring a credit card or cash on the day of your procedure- please leave with a family member rather than bringing it into the preop area.    Current Visitor Policy:  Visitors must be free of fever and symptoms to be in our facilities.  No more than 2 visitors per patient are allowed.  Additional guidelines may vary, based on patient care area or patient's condition.  Patients in semiprivate rooms may have visitors, but visits should be coordinated so only two total visitors are in a room at a time due to space limitations.  Children younger than age 36 are allowed to visit inpatients.  Please dress in layers as some areas may be colder or warmer than others.    Important phone numbers  To put a credit card on file call [facility]:  [phone]  For any medication changes or questions call pharmacy at [phone]  For arrival time questions (after 4:30pm the business day prior to surgery) call [phone]  For day of surgery cancellation or to notify us  for a late arrival call [phone]    If you have any changes to your health or hospitalizations between now and your surgery, or pre-op questions please call us  at [phone].    Instructions given to patient via: MyChart and verbal    Thank you for participating in your Preoperative Assessment visit today.

## 2023-10-17 NOTE — Pre-Anesthesia Medication Instructions
 YOUR MEDICATION LIST   aspirin EC (ASPIR-LOW) 81 mg tablet Take one tablet by mouth daily. Indications: s/p Watchman    bisoprolol fumarate (ZEBETA) 5 mg tablet Take one tablet by mouth twice daily.    calcium carbonate (TUMS) 500 mg (200 mg elemental calcium) chewable tablet Chew one tablet by mouth daily as needed.    CHOLEcalciferoL (vitamin D3) (VITAMIN D3) 50 mcg (2,000 unit) tablet Take one tablet by mouth every 48 hours.    coenzyme Q10 100 mg cap Take one capsule by mouth three times weekly. Monday, Wednesday and Friday.    Cranberry 500 mg cap Take one capsule by mouth daily.    cyanocobalamin (vitamin B-12) 1,000 mcg tablet Take one tablet by mouth every 48 hours.    evolocumab (REPATHA SURECLICK) 140 mg/mL injectable PEN Inject 1 mL under the skin every 14 days.    ezetimibe (ZETIA) 10 mg tablet TAKE 1 TABLET EVERY DAY    ferrous sulfate (FEOSOL) 325 mg (65 mg iron) tablet Take one tablet by mouth three times weekly. Take on an empty stomach at least 1 hour before or 2 hours after food. Takes on Monday, Wednesday and Friday    furosemide (LASIX) 40 mg tablet Take 1/2-1 tablet by mouth daily as directed for swelling and weight gain.    glucosamine/chondro su A/C/Mn (GLUCOSAMINE-CHONDROITIN COMPLX PO) Take 1 tablet by mouth daily.    levothyroxine (SYNTHROID) 175 mcg tablet Take one tablet by mouth daily 30 minutes before breakfast.    losartan (COZAAR) 100 mg tablet Take one tablet by mouth daily. Indications: high blood pressure    magnesium glycinate 100 mg magnesium capsule Take one capsule by mouth every 48 hours.    metFORMIN (GLUCOPHAGE) 500 mg tablet Take one tablet by mouth twice daily with meals. Ok to resume 06/06/23  Indications: type 2 diabetes mellitus    omeprazole DR(+) (PRILOSEC) 20 mg PO capsule Take one capsule by mouth daily.    potassium chloride SR (KLOR-CON M20) 20 mEq tablet Take one tablet by mouth daily. Take while taking furosemide.  Indications: prevention of low potassium in the blood    rosuvastatin (CRESTOR) 20 mg tablet TAKE 1 TABLET EVERY DAY    vitamins, multiple tablet Take one tablet by mouth every 48 hours.       YOUR MEDICATION INSTRUCTIONS FOR PROCEDURE    See below for any changes on how you take your medications before your procedure.    14 DAYS BEFORE SURGERY  Do not take the following vitamins, herbals, and supplements: Last dose 10/30/2023  CoQ10  Glucosamine-Chondroitin  Multivitamin    Do not start any new vitamins, herbals, or natural supplements before surgery.    7 DAYS BEFORE SURGERY  DO NOT TAKE the following anti-inflammatory medications such as ibuprofen (Advil, Motrin) and naproxen (Aleve) Last dose 11/06/2023  You may use acetaminophen (Tylenol)    BLOOD THINNER instructions:  Aspirin - Per instructions from Dr. Poulson, please continue taking as prescribed.     DAY BEFORE SURGERY  TAKE your medications the day before surgery as usual unless told differently.    MORNING OF SURGERY  DO NOT take these medications:  Remaining vitamins/supplements  Ointments/creams/lotions  Calcium Carbonate (Tums)  Cholecalciferol (Vitamin D3)  Cranberry  Cyanocobalamin (Vitamin B12)  Evolocumab (Repatha)  Ezetimibe (Zetia)  Ferrous Sulfate (Iron)  Furosemide (Lasix)  Losartan (Cozaar)  Magnesium Glycinate  Metformin (Glucophage)  Potassium (Klor-Con)     TAKE these medications:  Aspirin  Bisoprolol (Zebeta)  Levothyroxine (Synthroid)  Omeprazole (Prilosec)  Rosuvastatin (Crestor)  Use all inhalers, nasal sprays, and eye drops as usual  May take if needed:  Acetaminophen (Tylenol)     Please contact the clinic pharmacist with any changes to your medication list or medication questions before surgery.  E-mail:  [email or web address]   Phone: [phone]  [facility] Phone Triage Nurse - Earlyne Glory: [phone]     Before going home from the hospital, please ask your doctor when you should re-start any medicine that was stopped for surgery.

## 2023-10-18 ENCOUNTER — Encounter: Admit: 2023-10-18 | Discharge: 2023-10-18 | Payer: MEDICARE

## 2023-10-18 MED ORDER — BISOPROLOL FUMARATE 5 MG PO TAB
5 mg | ORAL_TABLET | Freq: Two times a day (BID) | ORAL | 3 refills | 90.00000 days | Status: AC
Start: 2023-10-18 — End: ?

## 2023-10-26 ENCOUNTER — Encounter: Admit: 2023-10-26 | Discharge: 2023-10-26 | Payer: MEDICARE

## 2023-10-26 MED ORDER — REPATHA SURECLICK 140 MG/ML SC PNIJ
140 mg | SUBCUTANEOUS | 3 refills | 28.00000 days | Status: AC
Start: 2023-10-26 — End: ?
  Filled 2023-11-13: qty 6, 84d supply, fill #1

## 2023-10-29 ENCOUNTER — Encounter: Admit: 2023-10-29 | Discharge: 2023-10-29 | Payer: MEDICARE

## 2023-10-29 DIAGNOSIS — I10 Essential (primary) hypertension: Secondary | ICD-10-CM

## 2023-10-29 MED ORDER — LOSARTAN 100 MG PO TAB
100 mg | ORAL_TABLET | Freq: Every day | ORAL | 3 refills | 90.00000 days | Status: AC
Start: 2023-10-29 — End: ?

## 2023-11-01 ENCOUNTER — Encounter: Admit: 2023-11-01 | Discharge: 2023-11-01 | Payer: MEDICARE

## 2023-11-07 ENCOUNTER — Encounter: Admit: 2023-11-07 | Discharge: 2023-11-07 | Payer: MEDICARE

## 2023-11-08 ENCOUNTER — Encounter: Admit: 2023-11-08 | Discharge: 2023-11-08 | Payer: MEDICARE

## 2023-11-12 ENCOUNTER — Encounter: Admit: 2023-11-12 | Discharge: 2023-11-12 | Payer: MEDICARE

## 2023-11-13 ENCOUNTER — Inpatient Hospital Stay: Admit: 2023-11-13 | Discharge: 2023-11-13 | Payer: MEDICARE

## 2023-11-13 MED ORDER — HEPARIN 5,000 UNITS IN LR 500 ML IRR
Freq: Once | 0 refills | Status: DC
Start: 2023-11-13 — End: 2023-11-14

## 2023-11-14 ENCOUNTER — Encounter: Admit: 2023-11-14 | Discharge: 2023-11-14 | Payer: MEDICARE

## 2023-11-14 ENCOUNTER — Inpatient Hospital Stay: Admit: 2023-11-14 | Discharge: 2023-11-14 | Payer: MEDICARE

## 2023-11-14 LAB — BLOOD GASES, ARTERIAL
~~LOC~~ BKR BASE EXCESS-ART: 0.9 mmol/L
~~LOC~~ BKR BICARB, ART(CAL): 25 mmol/L (ref 21.0–28.0)
~~LOC~~ BKR O2 SAT-ART: 96 % (ref 95.0–99.0)
~~LOC~~ BKR PCO2-ART: 40 mmHg (ref 35–45)
~~LOC~~ BKR PH-ART: 7.4 (ref 7.35–7.45)
~~LOC~~ BKR PO2-ART: 76 mmHg — ABNORMAL LOW (ref 80–100)

## 2023-11-14 LAB — CBC
~~LOC~~ BKR HEMATOCRIT: 48 % (ref 40.0–50.0)
~~LOC~~ BKR HEMATOCRIT: 45 % (ref 40.0–50.0)
~~LOC~~ BKR MCH: 29 pg (ref 26.0–34.0)
~~LOC~~ BKR MCHC: 33 g/dL (ref 32.0–36.0)
~~LOC~~ BKR MCV: 89 fL — ABNORMAL HIGH (ref 80.0–100.0)
~~LOC~~ BKR MPV: 8.5 fL (ref 7.0–11.0)
~~LOC~~ BKR MPV: 8.7 fL (ref 7.0–11.0)
~~LOC~~ BKR PLATELET COUNT: 213 10*3/uL (ref 150–400)
~~LOC~~ BKR PLATELET COUNT: 178 10*3/uL (ref 150–400)
~~LOC~~ BKR RBC COUNT: 5 10*6/uL (ref 4.40–5.50)
~~LOC~~ BKR RDW: 14 % (ref 11.0–15.0)
~~LOC~~ BKR WBC COUNT: 8.6 10*3/uL (ref 4.50–11.00)

## 2023-11-14 LAB — POC GLUCOSE
~~LOC~~ BKR POC GLUCOSE: 140 mg/dL — ABNORMAL HIGH (ref 70–100)
~~LOC~~ BKR POC GLUCOSE: 185 mg/dL — ABNORMAL HIGH (ref 70–100)
~~LOC~~ BKR POC GLUCOSE: 145 mg/dL — ABNORMAL HIGH (ref 70–100)
~~LOC~~ BKR POC GLUCOSE: 113 mg/dL — ABNORMAL HIGH (ref 70–100)

## 2023-11-14 LAB — BASIC METABOLIC PANEL
~~LOC~~ BKR BLD UREA NITROGEN: 18 mg/dL (ref 7–25)
~~LOC~~ BKR CALCIUM: 8.7 mg/dL (ref 8.5–10.6)
~~LOC~~ BKR CREATININE: 0.7 mg/dL (ref 0.40–1.24)
~~LOC~~ BKR GLOMERULAR FILTRATION RATE (GFR): >60 mL/min (ref >60)
~~LOC~~ BKR GLOMERULAR FILTRATION RATE (GFR): >60 mL/min (ref >60)
~~LOC~~ BKR GLUCOSE, RANDOM: 169 mg/dL — ABNORMAL HIGH (ref 70–100)
~~LOC~~ BKR POTASSIUM: 4.3 mmol/L (ref 3.5–5.1)

## 2023-11-14 LAB — TYPE & CROSSMATCH
~~LOC~~ BKR ANTIBODY SCREEN: NEGATIVE
~~LOC~~ BKR UNITS ORDERED: 0

## 2023-11-14 LAB — POC ACTIVATED CLOTTING TIME (ISTAT)
~~LOC~~ BKR POCT ACT ISTAT: 222 s
~~LOC~~ BKR POCT ACT ISTAT: 227 s

## 2023-11-14 MED ORDER — ACETAMINOPHEN 500 MG PO TAB
1000 mg | Freq: Once | ORAL | 0 refills | Status: DC | PRN
Start: 2023-11-14 — End: 2023-11-14

## 2023-11-14 MED ORDER — LACTATED RINGERS IV BOLUS
1000 mL | Freq: Once | INTRAVENOUS | 0 refills | Status: CP
Start: 2023-11-14 — End: ?
  Administered 2023-11-14: 11:00:00 1000 mL via INTRAVENOUS

## 2023-11-14 MED ORDER — EZETIMIBE 10 MG PO TAB
10 mg | Freq: Every day | ORAL | 0 refills | Status: DC
Start: 2023-11-14 — End: 2023-11-16
  Administered 2023-11-14 – 2023-11-16 (×3): 10 mg via ORAL

## 2023-11-14 MED ORDER — LIDOCAINE HCL 10 MG/ML (1 %) IJ SOLN
0 refills | Status: DC
Start: 2023-11-14 — End: 2023-11-14

## 2023-11-14 MED ORDER — ONDANSETRON HCL (PF) 4 MG/2 ML IJ SOLN
4 mg | INTRAVENOUS | 0 refills | Status: DC | PRN
Start: 2023-11-14 — End: 2023-11-16

## 2023-11-14 MED ORDER — DEXTROSE 50 % IN WATER (D50W) IV SYRG
12.5-25 g | INTRAVENOUS | 0 refills | Status: DC | PRN
Start: 2023-11-14 — End: 2023-11-16

## 2023-11-14 MED ORDER — DIPHENHYDRAMINE HCL 25 MG PO CAP
25 mg | Freq: Every evening | ORAL | 0 refills | Status: DC | PRN
Start: 2023-11-14 — End: 2023-11-16
  Administered 2023-11-15: 07:00:00 25 mg via ORAL

## 2023-11-14 MED ORDER — FAMOTIDINE 20 MG PO TAB
20 mg | Freq: Two times a day (BID) | ORAL | 0 refills | Status: DC
Start: 2023-11-14 — End: 2023-11-14

## 2023-11-14 MED ORDER — INSULIN ASPART 100 UNIT/ML SC FLEXPEN
0-12 [IU] | Freq: Before meals | SUBCUTANEOUS | 0 refills | Status: DC
Start: 2023-11-14 — End: 2023-11-16
  Administered 2023-11-14: 17:00:00 2 [IU] via SUBCUTANEOUS

## 2023-11-14 MED ORDER — HEPARIN 5,000 UNITS IN LR 500 ML IRR
0 refills | Status: DC
Start: 2023-11-14 — End: 2023-11-14
  Administered 2023-11-14 (×2): 500 mL

## 2023-11-14 MED ORDER — ACETAMINOPHEN 1,000 MG/100 ML (10 MG/ML) IV SOLN
1000 mg | Freq: Once | INTRAVENOUS | 0 refills | Status: DC | PRN
Start: 2023-11-14 — End: 2023-11-14

## 2023-11-14 MED ORDER — HYDROCODONE-ACETAMINOPHEN 5-325 MG PO TAB
1.5 | ORAL | 0 refills | Status: DC | PRN
Start: 2023-11-14 — End: 2023-11-16
  Administered 2023-11-14 – 2023-11-16 (×7): 1.5 via ORAL

## 2023-11-14 MED ORDER — ALUM-MAG HYDROXIDE-SIMETH 200-200-20 MG/5 ML PO SUSP
30 mL | ORAL | 0 refills | Status: DC | PRN
Start: 2023-11-14 — End: 2023-11-16

## 2023-11-14 MED ORDER — NOREPINEPHRINE BITARTRATE-D5W 4 MG/250 ML (16 MCG/ML) IV SOLN
0-.5 ug/kg/min | INTRAVENOUS | 0 refills | Status: DC
Start: 2023-11-14 — End: 2023-11-14
  Administered 2023-11-14: 18:00:00 0.02 ug/kg/min via INTRAVENOUS

## 2023-11-14 MED ORDER — ROSUVASTATIN 20 MG PO TAB
20 mg | Freq: Every day | ORAL | 0 refills | Status: DC
Start: 2023-11-14 — End: 2023-11-16
  Administered 2023-11-14 – 2023-11-16 (×3): 20 mg via ORAL

## 2023-11-14 MED ORDER — ONDANSETRON 4 MG PO TBDI
4 mg | ORAL | 0 refills | Status: DC | PRN
Start: 2023-11-14 — End: 2023-11-16

## 2023-11-14 MED ORDER — CEFAZOLIN 1GM NS IRR
0 refills | Status: DC
Start: 2023-11-14 — End: 2023-11-14
  Administered 2023-11-14 (×2): 1000 mL

## 2023-11-14 MED ORDER — FENTANYL CITRATE (PF) 50 MCG/ML IJ SOLN
12.5 ug | INTRAVENOUS | 0 refills | Status: DC | PRN
Start: 2023-11-14 — End: 2023-11-14

## 2023-11-14 MED ORDER — FAMOTIDINE (PF) 20 MG/2 ML IV SOLN
20 mg | Freq: Two times a day (BID) | INTRAVENOUS | 0 refills | Status: DC
Start: 2023-11-14 — End: 2023-11-14

## 2023-11-14 MED ORDER — METOCLOPRAMIDE HCL 5 MG/ML IJ SOLN
10 mg | Freq: Once | INTRAVENOUS | 0 refills | Status: DC | PRN
Start: 2023-11-14 — End: 2023-11-14

## 2023-11-14 MED ORDER — ONDANSETRON HCL (PF) 4 MG/2 ML IJ SOLN
4 mg | Freq: Once | INTRAVENOUS | 0 refills | Status: DC | PRN
Start: 2023-11-14 — End: 2023-11-14

## 2023-11-14 MED ORDER — BISACODYL 10 MG RE SUPP
10 mg | Freq: Every day | RECTAL | 0 refills | Status: DC | PRN
Start: 2023-11-14 — End: 2023-11-16

## 2023-11-14 MED ORDER — HYDROMORPHONE (PF) 2 MG/ML IJ SYRG
.2 mg | INTRAVENOUS | 0 refills | Status: DC | PRN
Start: 2023-11-14 — End: 2023-11-14

## 2023-11-14 MED ORDER — BISOPROLOL FUMARATE 5 MG PO TAB
5 mg | Freq: Two times a day (BID) | ORAL | 0 refills | Status: DC
Start: 2023-11-14 — End: 2023-11-14

## 2023-11-14 MED ORDER — HYDROCODONE-ACETAMINOPHEN 5-325 MG PO TAB
1 | ORAL | 0 refills | Status: DC | PRN
Start: 2023-11-14 — End: 2023-11-14
  Administered 2023-11-14: 20:00:00 1 via ORAL

## 2023-11-14 MED ORDER — ACETAMINOPHEN 325 MG PO TAB
650 mg | ORAL | 0 refills | Status: DC | PRN
Start: 2023-11-14 — End: 2023-11-16
  Administered 2023-11-15 – 2023-11-16 (×3): 650 mg via ORAL

## 2023-11-14 MED ORDER — MAGNESIUM HYDROXIDE 400 MG/5 ML PO SUSP
30 mL | Freq: Every day | ORAL | 0 refills | Status: DC | PRN
Start: 2023-11-14 — End: 2023-11-16

## 2023-11-14 MED ORDER — CEFAZOLIN 2 GRAM IV SOLR
2 g | Freq: Once | INTRAVENOUS | 0 refills | Status: CP
Start: 2023-11-14 — End: ?
  Administered 2023-11-14: 13:00:00 2 g via INTRAVENOUS

## 2023-11-14 MED ORDER — PROCHLORPERAZINE 25 MG RE SUPP
25 mg | RECTAL | 0 refills | Status: DC | PRN
Start: 2023-11-14 — End: 2023-11-16

## 2023-11-14 MED ORDER — PANTOPRAZOLE 40 MG PO TBEC
40 mg | Freq: Every day | ORAL | 0 refills | Status: DC
Start: 2023-11-14 — End: 2023-11-16
  Administered 2023-11-15 – 2023-11-16 (×2): 40 mg via ORAL

## 2023-11-14 MED ORDER — ASPIRIN 81 MG PO TBEC
81 mg | Freq: Every day | ORAL | 0 refills | Status: DC
Start: 2023-11-14 — End: 2023-11-16
  Administered 2023-11-14 – 2023-11-16 (×3): 81 mg via ORAL

## 2023-11-14 MED ORDER — FENTANYL CITRATE (PF) 50 MCG/ML IJ SOLN
25 ug | INTRAVENOUS | 0 refills | Status: DC | PRN
Start: 2023-11-14 — End: 2023-11-14
  Administered 2023-11-14: 18:00:00 25 ug via INTRAVENOUS

## 2023-11-14 MED ORDER — DIPHENHYDRAMINE HCL 50 MG/ML IJ SOLN
25 mg | Freq: Every evening | INTRAVENOUS | 0 refills | Status: DC | PRN
Start: 2023-11-14 — End: 2023-11-16

## 2023-11-14 MED ORDER — ENOXAPARIN 40 MG/0.4 ML SC SYRG
40 mg | Freq: Every day | SUBCUTANEOUS | 0 refills | Status: DC
Start: 2023-11-14 — End: 2023-11-16
  Administered 2023-11-15: 01:00:00 40 mg via SUBCUTANEOUS

## 2023-11-14 MED ORDER — THROMBIN (BOVINE) 5,000 UNIT TP SPSY
0 refills | Status: DC
Start: 2023-11-14 — End: 2023-11-14

## 2023-11-14 MED ORDER — IMS MIXTURE TEMPLATE
175 ug | Freq: Every day | ORAL | 0 refills | Status: DC
Start: 2023-11-14 — End: 2023-11-16
  Administered 2023-11-15 – 2023-11-16 (×4): 175 ug via ORAL

## 2023-11-14 MED ADMIN — NICARDIPINE 25 MG/10 ML IV SOLN [79106]: .25 mg | INTRAVENOUS | @ 13:00:00 | Stop: 2023-11-14 | NDC 00143968910

## 2023-11-14 MED ADMIN — NITROGLYCERIN IN 5 % DEXTROSE 50 MG/250 ML (200 MCG/ML) IV SOLN [15859]: .5 ug/kg/min | INTRAVENOUS | @ 13:00:00 | Stop: 2023-11-14 | NDC 00338104902

## 2023-11-14 MED ADMIN — SODIUM CHLORIDE 0.9 % IJ SOLN [7319]: .25 mg | INTRAVENOUS | @ 13:00:00 | Stop: 2023-11-14 | NDC 00409488810

## 2023-11-14 MED ADMIN — DEXMEDETOMIDINE IN 0.9 % NACL 20 MCG/5 ML (4 MCG/ML) IV SYRG [455801]: .7 ug/kg/h | INTRAVENOUS | @ 13:00:00 | Stop: 2023-11-14 | NDC 71449013111

## 2023-11-14 MED ADMIN — NOREPINEPHRINE BITARTRATE-D5W 4 MG/250 ML (16 MCG/ML) IV SOLN [173410]: .02 ug/kg/min | INTRAVENOUS | @ 15:00:00 | Stop: 2023-11-14 | NDC 00338011220

## 2023-11-14 NOTE — Progress Notes
 Dr. Levorn Reason requested pt be escalated to SICU. Surgical resident at bedside for transfer to SICU. Bedside report given to Caitlin, Charity fundraiser.

## 2023-11-14 NOTE — Operative Report(Direct Entry)
 Name: Mason Lawson is a 78 y.o. male     DOB: 02-Aug-1945             MRN#: [id number]  DATE OF OPERATION: 11/14/2023    Pre-Operative Diagnosis:  1. 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 Aortic Stenosis s/p TAVR  3. Coronary Artery Disease  4. Hypertension  5. Hyperlipidemia    Post-Operative Diagnosis:   1. 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 Aortic Stenosis s/p TAVR  3. Coronary Artery Disease  4. Hypertension  5. Hyperlipidemia    Operative Procedure:  1. Right Carotid Endarterectomy with Bovine Pericardial Patch Angioplasty    Surgeon: Loretha Romney MD    Assistants: Rachel Swaziland MD    Anesthesia: General     Indications:   The patient is a 78 year old male who has a history of multiple medical problems who I saw initially for right carotid stenosis. In work-up pre-operatively it was found that he had severe aortic stenosis that was deemed more urgent. Once his valve was repaired I then saw him in follow-up and recommend a right carotid endarterectomy. I discussed the procedure, risks, benefits and alteratives. Consent was obtained.    Findings:   High lesion and did not tolerate additional anesthetic thus conversion to GETA was done. Standard endarterectomy done with bovine pericardial patch repair done. Sundt shunt placed while endarterectomy done. Doppler at conclusion showed multiphasic normal flows across the ICA. Awoke in the operating room with no deficits, awakens to command and moving all extremities.     Description of the Procedure:   The patient was taken to the operating room and placed supine on the table. The patient underwent a cervical plexus block in the pre-operative area. An arterial line was placed and monitored anesthesia care was done. The patient's neck was extended, rotated and he placed in a slight beach chair position. A time out procedure was done and pre-operative antibiotics were administered.    Along the anterior border of the sternocleidomastoid muscle, an 8cm longitudinal slightly curvilinear neck incision was made 15-blade. The subcutaneous tissues and platysma muscle were divided using electrocautery. The fascia of the anteromedial aspect of the sternocleidomastoid muscle was divided using electrocautery. Self retaining retractors were placed and aided in exposure. The muscle was retracted laterally. The jugular vein was identified. The facial vein was ligated and divided between 3-0 silk ties. The vein was retracted laterally. The vagus nerve was identified and kept free of dissection. The common carotid artery was circumferentially dissected free up to the level of the carotid bulb. We placed an umbilical tape. We next dissected the distal internal carotid artery free and controlled this with an umbilical tape as well. We identified the hypoglossal and protected this throughout the case. At this point and time the patient had difficulty being more sedated due to hypoxia and trouble breathing. Additionally given the lesion and how high it was to get a good end point I felt that it would safer to have him undergo GETA. We allowed anesthesia to place an ET tube and have him under completely. The external carotid was ecircled with vessel loops on potts ties after carefully circumferentially dissecting free. The area of calcific plaque could be noted by external appearance to be at the bulb of the carotid.    The patient was systemically heparinized. The ACT was kept greater than 250 throughout the case. We then clamped the internal carotid with a gregory bulldog, followed  by a profunda clamp on the common and the external was occluded with the vessel loops. A longitudinal incision was made in the common carotid artery using a #11 scalpel. This was extended with a Potts scissors. We then extended the incision through the internal carotid artery and densely nearly occlusive soft ulcerated plaque with significant sedimenting and debris. We achieved an arteriotomy above the plaque. We then placed a Sundt shunt distally in the internal carotid after removing the profunda clamp and brought down our Rummel tourniquet. We proceeded in a similar fashion with placing the sundt shunt in the common carotid artery. Doppler interrogation of the shunt and internal carotid revealed decent flow.    We then performed a standard endarterectomy, handling the external carotid artery as an eversion endarterectomy and the internal carotid artery endpoint as a separate specimen. We sharply transected the distal  endpoint and this was excellent and without any loose elements. No repair tacking sutures were needed. We then brought a bovine  ericardial patch on to the field and sutured this in place. A running 6-0 Prolene suture was used to sew the patch in place distally and a 5-0 Prolene run from proximal to distal. We removed the shunt and then the suture line was completed and tied. Prior to completion of th anastamosis we back bled and forward bled the internal, external and common carotid arteries. The carotid was flushed. Flow was restored to the external carotid artery first, followed by the internal carotid artery after several heartbeats. Doppler interrogation of the internal, common and external all revealed excellent flow.    We then gave protamine sulfate for heparin reversal. Thrombin spray and some surgicicell was placed over to obtain hemostasis. We did have a to place one repair suture. The wound was then irrigated copiously with normal saline. A 2-0 Vicryl suture was used to reapproximate the fascia of the sternocleidomastoid muscle. A running 3-0 Vicryl suture was used to close the platysma muscle. A running 4-0 Monocryl subcuticular stitch was used to close the skin. Sterstrips and a sterile dressing was applied.    The patient was awakened in the operating room and transferred back to recovery in stable condition.  All sponge, instrument, and needle counts were correct at the end of the case.  I was present and scrubbed for the entire procedure.    He was extubated in the operating room. He was awaken, responding to commands, and moving all extremities.    I then left the room to discuss the findings with the wife.    Estimated Blood Loss: 151-263ml    Specimen(s) Removed/Disposition:  ID Type Source Tests Collected by Time Destination   A :  Blood Arterial MISCELLANEOUS LAB TEST [place] Snow, DO 11/14/2023 0935      Implants:  Implant Name Type Inv. Item Serial No. Manufacturer Lot No. LRB No. Used Action   PATCH CARDIOVASCULAR 8X.8CM VASCU-GUARD TAPER PERIPHERAL - SN/A  PATCH CARDIOVASCULAR 8X.8CM VASCU-GUARD TAPER PERIPHERAL N/A [employer] [id number] Right 1 Implanted         Drains: None    Urine Output: None    Complications: None    Disposition: PACU    Attestation: I performed this procedure with a resident.    Loretha Romney, MD  11/14/2023

## 2023-11-14 NOTE — H&P (View-Only)
 Admission History and Physical Examination    Name: Mason Lawson   MRN: [id number]     DOB: June 27, 1946      Age: 78 y.o.  Admission Date: 11/14/2023     LOS: 0 days     Date of Service: 11/14/2023                       Assessment/Plan:    Principal Problem:    Right-sided extracranial carotid artery stenosis  Active Problems:    Coronary artery disease    Hypercholesterolemia    Hypothyroidism    Hypertension    Chronic diastolic heart failure, NYHA class 2 (CMS-HCC)    DM (diabetes mellitus) type II, controlled, with peripheral vascular disorder (CMS-HCC)    Cardiac pacemaker    PAF (paroxysmal atrial fibrillation) (CMS-HCC)    Presence of Watchman left atrial appendage closure device    Stage 1 mild COPD by GOLD classification (CMS-HCC)    S/p TAVR (transcatheter aortic valve replacement), bioprosthetic    PAD (peripheral artery disease)    Surgical H&P      - Patient was seen at bedside   - Plan for OR today for R CEA  - The operative extremity was properly marked, informed consent was obtained  - The risks/benefits of the procedure were explained to the patient who expressed understanding. All questions were answered and the patient would like to proceed   - No changes on physical exam or PMH from recent clinic visit  - Further recommendations to follow postop    Libbie Redwood, MD    __________________________________________________________________________________  Primary Care Physician: Roselynn Connors  Verified    Chief Complaint:  carotid stenosis  History of Present Illness: Mason Lawson is a 78 y.o. male presents with carotid artery stenosis. I originally saw the patient in October 2024 for initial consultation regarding this. Patient has a past medical history notable for multiple medical problems including coronary artery disease status post CABG, atrial fibrillation status post watchman, hypertension, COPD, diabetes mellitus non-insulin-requiring, GERD and aortic stenosis status post TAVR. Patient was undergoing workup for TAVR when some right carotid artery stenosis was identified on duplex to be greater than 70%. The patient is asymptomatic from all this. I recommended a CTA for further evaluation. In workup the patient had become symptomatic from his from his aortic stenosis and TAVR was done and December. He states that he has no real symptoms on examination today. States he feels better from a shortness of breath and has no dyspnea. He remains asymptomatic from his carotid disease and has no recent history of stroke, TIA, or amaurosis fugax.     History of Present Illness    Past Medical History:    Abnormal stress test    Atrial fibrillation (CMS-HCC)    Cardiac pacemaker    Chest pain    Cigarette smoker    COPD (chronic obstructive pulmonary disease) (CMS-HCC)    Coronary artery disease    Former cigarette smoker    GERD (gastroesophageal reflux disease)    Hypercholesterolemia    Hypertension    Hypothyroidism    Presence of Watchman left atrial appendage closure device    Tests ordered    Undiagnosed cardiac murmurs    Valvular heart disease     Surgical History:   Procedure Laterality Date    HX CORONARY ARTERY BYPASS GRAFT  2005    x3 vessels    HEART CATHETERIZATION  2005  HEART CATHETERIZATION  2012    HEART CATHETERIZATION  2019    ANGIOGRAPHY CORONARY ARTERY WITH RIGHT AND LEFT HEART CATHETERIZATION N/A 06/18/2018    Performed by Drucella Georgi, MD, FACC at [facility] CATH LAB    POSSIBLE PERCUTANEOUS CORONARY STENT PLACEMENT WITH ANGIOPLASTY N/A 06/18/2018    Performed by Drucella Georgi, MD, FACC at [facility] CATH LAB    ABLATION OF DYSRHYTHMIC FOCUS  01/21/2021    PACEMAKER INSERTION  01/21/2021    INSERTION/ REPLACEMENT PERMANENT PACEMAKER WITH ATRIAL AND VENTRICULAR LEAD Left 01/21/2021    Performed by Ramirez, Rigoberto, MD at [facility] EP LAB    INTRACARDIAC CATHETER ABLATION WITH COMPREHENSIVE ELECTROPHYSIOLOGIC EVALUATION - TYPICAL FLUTTER  01/21/2021    Performed by Vaughn Georges, MD at [facility] EP LAB    PERCUTANEOUS CLOSURE LEFT ATRIAL APPENDAGE WITH ENDOCARDIAL IMPLANT, TRANSEPTAL CATHETERIZATION, FOLEY, CONTRAST ANTICIPATED N/A 02/20/2022    Performed by Mertie Abt, MD at [facility] EP LAB    INTRACARDIAC ECHOCARDIOGRAPHY  02/20/2022    Performed by Mertie Abt, MD at [facility] EP LAB    PERCUTANEOUS CORONARY STENT PLACEMENT WITH ANGIOPLASTY N/A 06/22/2022    Performed by Kraig Peru, MD at [facility] CATH LAB    ANGIOGRAPHY CORONARY ARTERY AND ARTERIAL/VENOUS GRAFTS WITH LEFT HEART CATHETERIZATION N/A 06/22/2022    Performed by Kraig Peru, MD at [facility] CATH LAB    ANGIOGRAPHY CORONARY ARTERY WITH LEFT HEART CATHETERIZATION N/A 03/27/2023    Performed by Hajj, Wilhemenia Hard, MD at [facility] CATH LAB    PERCUTANEOUS CORONARY STENT PLACEMENT WITH ANGIOPLASTY N/A 03/27/2023    Performed by Hajj, Wilhemenia Hard, MD at [facility] CATH LAB    AORTIC VALVE REPLACEMENT  06/04/2023    [place]    Transcatheter Aortic Valve Replacement - Axillary Artery N/A 06/04/2023    Performed by Jerrald Moose, MD at [facility] EP LAB    CARDIAC CATHERIZATION      CARDIOVASCULAR STRESS TEST      ELECTROCARDIOGRAM      LEFT ATRIAL APPENDAGE CLOSURE      TRANSESOPHAGEAL ECHO       Family History   Problem Relation Name Age of Onset    Hypertension Mother Benita      Social History     Tobacco Use    Smoking status: Former     Current packs/day: 0.00     Average packs/day: 1 pack/day for 40.0 years (40.0 ttl pk-yrs)     Types: Cigarettes     Start date: 06/02/1964     Quit date: 06/02/2004     Years since quitting: 19.4    Smokeless tobacco: Never   Vaping Use    Vaping status: Never Used   Substance and Sexual Activity    Alcohol use: Yes     Alcohol/week: 9.0 - 10.0 standard drinks of alcohol     Types: 9 - 10 Shots of liquor per week    Drug use: Never    Sexual activity: Not Currently     Partners: Female           Immunizations (includes history and patient reported):   Immunization History   Administered Date(s) Administered COVID-19 (MODERNA), mRNA vacc, 100 mcg/0.5 mL (PF) 10/28/2019, 11/28/2019, 06/04/2020    COVID-19 Bivalent (44YR+)(PFIZER), mRNA vacc, 69mcg/0.3mL 05/02/2021    Covid-19 mRNA Vaccine >=12yo (Moderna)(Spikevax) 04/15/2022    Flu Vaccine =>65 YO High-dose Trivalent PF 03/28/2023           Allergies:  Cefdinir, Oxycodone, and  Percodan [oxycodone hcl-oxycodone-asa]    Medications:  Medications Prior to Admission   Medication Sig    aspirin EC (ASPIR-LOW) 81 mg tablet Take one tablet by mouth daily. Indications: s/p Watchman    bisoprolol fumarate (ZEBETA) 5 mg tablet Take one tablet by mouth twice daily.    calcium carbonate (TUMS) 500 mg (200 mg elemental calcium) chewable tablet Chew one tablet by mouth daily as needed.    CHOLEcalciferoL (vitamin D3) (VITAMIN D3) 50 mcg (2,000 unit) tablet Take one tablet by mouth every 48 hours.    coenzyme Q10 100 mg cap Take one capsule by mouth three times weekly. Monday, Wednesday and Friday.    Cranberry 500 mg cap Take one capsule by mouth daily.    cyanocobalamin (vitamin B-12) 1,000 mcg tablet Take one tablet by mouth every 48 hours.    evolocumab (REPATHA SURECLICK) 140 mg/mL injectable PEN Inject 1 mL under the skin every 14 days.    ezetimibe (ZETIA) 10 mg tablet TAKE 1 TABLET EVERY DAY    ferrous sulfate (FEOSOL) 325 mg (65 mg iron) tablet Take one tablet by mouth three times weekly. Take on an empty stomach at least 1 hour before or 2 hours after food. Takes on Monday, Wednesday and Friday    furosemide (LASIX) 40 mg tablet Take 1/2-1 tablet by mouth daily as directed for swelling and weight gain.    glucosamine/chondro su A/C/Mn (GLUCOSAMINE-CHONDROITIN COMPLX PO) Take 1 tablet by mouth daily.    levothyroxine (SYNTHROID) 175 mcg tablet Take one tablet by mouth daily 30 minutes before breakfast.    losartan (COZAAR) 100 mg tablet TAKE 1 TABLET EVERY DAY    magnesium glycinate 100 mg magnesium capsule Take one capsule by mouth every 48 hours.    metFORMIN (GLUCOPHAGE) 500 mg tablet Take one tablet by mouth twice daily with meals. Ok to resume 06/06/23  Indications: type 2 diabetes mellitus    omeprazole DR(+) (PRILOSEC) 20 mg PO capsule Take one capsule by mouth daily.    potassium chloride SR (KLOR-CON M20) 20 mEq tablet Take one tablet by mouth daily. Take while taking furosemide.  Indications: prevention of low potassium in the blood    rosuvastatin (CRESTOR) 20 mg tablet TAKE 1 TABLET EVERY DAY    vitamins, multiple tablet Take one tablet by mouth every 48 hours.       ROS    Physical Exam  Vital Signs: Last Filed In 24 Hours Vital Signs: 24 Hour Range                General Appearance: no acute distress  HEENT: unremarkable  Neck Veins: neck veins are flat & not distended  Chest Inspection: non labored respirations, right chest incision well approximated, no hematoma  Neurologic Exam: oriented to time, place and person; no focal neurologic deficits  Psychiatric: Normal mood and affect.  Behavior is normal. Judgment and thought content normal.       Lab/Radiology/Other Diagnostic Tests:  Pertinent labs reviewed     Pertinent radiology reviewed.    Libbie Redwood, MD  Pager [phone]

## 2023-11-14 NOTE — Unmapped
 Brief Operative Note    Name: [name] is a 78 y.o. male     DOB: 05-05-1946             MRN#: [id number]  DATE OF OPERATION: 11/14/2023    Date:  11/14/2023        Preoperative Dx:   Carotid stenosis, asymptomatic, right [I65.21]    Post-op Diagnosis      * Carotid stenosis, asymptomatic, right [I65.21]    Procedure(s) (LRB):  RIGHT CAROTID ENDARTERECTOMY (Right)    Surgeons and Role:     * Loretha Romney, MD - Primary     * Swazil[place], Rachel K, MD - Resident - Assisting      Findings:  see op note     Estimated Blood Loss: No blood loss documented.     Specimen(s) Removed/Disposition:   ID Type Source Tests Collected by Time Destination   A :  Blood Arterial MISCELLANEOUS LAB TEST [place] Snow, DO 11/14/2023 0935        Complications:  None    Implants:   Implant Name Serial No. Manufacturer Lot No. LRB No. Used Action   PATCH CARDIOVASCULAR 8X.8CM VASCU-GUARD TAPER PERIPHERAL - SN/A N/A [employer] [id number] Right 1 Implanted       Drains: Details on drains available on LDA report    Disposition:  PACU - stable    Loretha Romney, MD  Pager

## 2023-11-15 LAB — POC GLUCOSE
~~LOC~~ BKR POC GLUCOSE: 101 mg/dL — ABNORMAL HIGH (ref 70–100)
~~LOC~~ BKR POC GLUCOSE: 173 mg/dL — ABNORMAL HIGH (ref 70–100)
~~LOC~~ BKR POC GLUCOSE: 104 mg/dL — ABNORMAL HIGH (ref 70–100)
~~LOC~~ BKR POC GLUCOSE: 130 mg/dL — ABNORMAL HIGH (ref 70–100)

## 2023-11-15 MED ORDER — HYDROCODONE-ACETAMINOPHEN 5-325 MG PO TAB
1.5 | ORAL_TABLET | ORAL | 0 refills | Status: CN | PRN
Start: 2023-11-15 — End: ?

## 2023-11-15 MED ORDER — ACETAMINOPHEN 325 MG PO TAB
650 mg | ORAL | 0 refills | Status: CN | PRN
Start: 2023-11-15 — End: ?

## 2023-11-15 MED ORDER — PHENOL 1.4 % MM SPRA
2 | OROMUCOSAL | 0 refills | Status: DC | PRN
Start: 2023-11-15 — End: 2023-11-16
  Administered 2023-11-15: 07:00:00 2 via OROMUCOSAL

## 2023-11-15 NOTE — Progress Notes
 PHYSICAL THERAPY  ASSESSMENT      Name: Mason Lawson   MRN: [id number]     DOB: February 09, 1946      Age: 78 y.o.  Admission Date: 11/14/2023     LOS: 1 day     Date of Service: 11/15/2023      Mobility  Patient Turn/Position: Chair  Mobility Level [facility] Highest Level of Mobility (JH-HLM): Walk 250 feet or more  Distance Walked (feet): 400 ft  Level of Assistance: Stand by assistance  Assistive Device: None    Subjective  Significant Hospital Events: 78 yo M with CAD s/p CABG, afib s/p watchman, HTN, COPD, DM, GERD and aortic stenosis s/p TAVR, bl carotid artery stenosis s/p R CEA (5/14 [place])  Special Considerations: Current oxygen requirement (1L to maintain >92%)  Mental / Cognitive: Alert;Oriented;Cooperative  Pain: No complaint of pain    Home Living Situation  Lives With: Spouse/significant other  Type of Home: House  Entry Stairs: No stairs  In-Home Stairs: Able to live on main level  Bathroom Setup: Tub/shower unit  Patient Owned Equipment: None    Prior Level of Function  Level Of Independence: Independent with ADL and community mobility without device  History of Falls in Past 3 Months: No  Occupation/Education: Retired    Precautions  Precautions:  (carotid prec)    Strength  Overall Strength: WFL    Bed Mobility/Transfer  Bed Mobility: Supine to Sit: Modified Independent  Bed Mobility: Sit to Supine: Modified Independent  Transfer Type: Sit to/from Stand  Transfer: Assistance Level: To/From;Bed;Standby Assist  Transfer: Assistive Device: Chief of Staff Assist  Transfers: Type Of Assistance: For Balance;For Safety Considerations  Other Transfer Type: Stand to Sit  Other Transfer: Assistance Level: To;Bed Side Chair;Minimal Assist  Other Transfer: Assistive Device: None  Other Transfer: Type Of Assistance: Verbal Cues;For Balance;For Strength Deficit;For Safety Considerations  End Of Activity Status: Up in Chair;Nursing Notified;Instructed Patient to Request Assist with Mobility;Instructed Patient to Use Call Light    Balance  Standing Balance: Static Standing Balance;Dynamic Standing Balance;No UE support;Standby Assist    Gait  Gait Distance: 400 feet  Gait: Assistance Level: Standby Assist;Minimal Assist  Gait: Assistive Device: None  Gait: Descriptors: Pathway deviations;Variable step length  Activity Limited By: Complaint of Pain;Weakness    Education  Persons Educated: Patient  Patient Barriers To Learning: Pain;None Noted  Interventions: Repetition of Instructions  Teaching Methods: Verbal Instruction  Patient Response: Verbalized Understanding  Topics: Plan/Goals of PT Interventions;Mobility Progression;Up with Assist Only;Recommend Continued Therapy    Assessment/Progress  Comments: pt slightly unsteady and not quite back to his baseline, likely safe to d/c home in 1-2 days    AM-PAC 6 Clicks Basic Mobility Inpatient  Turning from your back to your side while in a flat bed without using bed rails: None  Moving from lying on your back to sitting on the side of a flat bed without using bedrails : A Little  Moving to and from a bed to a chair (including a wheelchair): A Little  Standing up from a chair using your arms (e.g. wheelchair, or bedside chair): A Little  To walk in hospital room: A Little  Climbing 3-5 steps with a railing: A Little  Basic Mobility Inpatient Raw Score: 19  Standardized (T-scale) Score: 42.48  Mobility Goal [facility]: JH-HLM 6 Walk >= 10 steps  Functional Stages - Basic Mobility Score Interpretation  34-51 Limited Mobility Indoors: Your score suggests significant difficulty in moving about independently  and the need for assistance.  You may be able to move about in a small area of your home that has been adapted to eliminate safety hazards.  You may have difficulty moving from a sitting to standing position, climbing stairs and you may have a great deal of difficulty moving about outdoors and in the community.    Goals  Goal Formulation: With Patient  Time For Goal Achievement: 6 days  Patient Will Go Supine To/From Sit: Independently  Patient Will Transfer Bed/Chair: Independently  Patient Will Ambulate: Greater than 200 Feet, w/ No Device, Independently    Plan  Treatment Interventions: Strengthening;Mobility training;Balance activities;Endurance training  Plan Frequency: 5-7 Days per Week  PT Plan for Next Visit: increase ind with gait    PT Discharge Recommendations  Recommendation:  (likely safe to d/c home in 1-2 days)    Therapist  Clent Czar, PT, DPT  Date  11/15/2023

## 2023-11-15 NOTE — Progress Notes
 Brief Surgery Note      S/p Right CEA    Pain well controlled. Denies vision changes, difficulty swallowing or change in voice. On exam there are no focal defects. Dressing is non saturated, and neck is soft.     A/P  -Will continue to monitor  -Please call with any vital sign or clinical status changes     Carmella Cho, MD

## 2023-11-15 NOTE — Progress Notes
 Daily Progress Note      Date of Service:  11/15/2023  Name:  Mason Lawson                       MRN:  [id number]   Admission Date: 11/14/2023 (LOS: 1 day)                     Assessment:   Sanford Lindblad is a 78 yo M with CAD s/p CABG, afib s/p watchman, HTN, COPD, DM, GERD and aortic stenosis s/p TAVR, bl carotid artery stenosis s/p R CEA (5/14 [facility]).      1 Day Post-Op from Procedure(s) (LRB):  RIGHT CAROTID ENDARTERECTOMY (Right) (11/14/2023).    Principal Problem:    Right-sided extracranial carotid artery stenosis  Active Problems:    Coronary artery disease    Hypercholesterolemia    Hypothyroidism    Hypertension    Chronic diastolic heart failure, NYHA class 2 (CMS-HCC)    DM (diabetes mellitus) type II, controlled, with peripheral vascular disorder (CMS-HCC)    Cardiac pacemaker    PAF (paroxysmal atrial fibrillation) (CMS-HCC)    Presence of Watchman left atrial appendage closure device    Stage 1 mild COPD by GOLD classification (CMS-HCC)    S/p TAVR (transcatheter aortic valve replacement), bioprosthetic    PAD (peripheral artery disease)    Carotid stenosis, right      Plan:  - Advance diet as tolerated  - ASA/statin  - Neurointact  - Continue monitoring for increased swelling of R neck, difficulty breathing or swallowing  - PT/OT  - Dressing removed this morning  - Encourage IS use      Discussed and seen with Dr. Lance Pinch  _____________________________________________________________________________    Subjective:  No acute events overnight. Neurointact. Reports he has not yet ambulated. Tolerated clears. Denies difficulty swallowing or change in voice.     Objective:  BP: (83-131)/(42-67)   ABP: (91-164)/(38-58)   Temp:  [36.4 ?C (97.6 ?F)-37.1 ?C (98.8 ?F)]   Pulse:  [60-67]   Respirations:  [11 PER MINUTE-32 PER MINUTE]   SpO2:  [89 %-98 %]   O2 Device: Nasal cannula  O2 Liter Flow: 1 Lpm  Body mass index is 28.34 kg/m?[name]      Lab Results   Component Value Date/Time    HGB 14.5 11/15/2023 02:14 AM    HCT 45.2 11/15/2023 02:14 AM    WBC 10.10 11/15/2023 02:14 AM    PLTCT 160 11/15/2023 02:14 AM    INR 1.2 06/04/2023 02:07 PM     Lab Results   Component Value Date/Time    NA 138 11/15/2023 02:14 AM    K 4.2 11/15/2023 02:14 AM    CL 104 11/15/2023 02:14 AM    CO2 24 11/15/2023 02:14 AM    BUN 17 11/15/2023 02:14 AM    CR 0.79 11/15/2023 02:14 AM    MG 1.7 06/05/2023 03:45 AM    PO4 4.2 03/28/2023 03:57 AM    CA 8.5 11/15/2023 02:14 AM     Lab Results   Component Value Date/Time    GLUPOC 104 (H) 11/15/2023 06:24 AM    GLUPOC 101 (H) 11/14/2023 09:48 PM    GLUPOC 113 (H) 11/14/2023 04:03 PM          Physical Exam  Constitutional:       Appearance: Normal appearance.   Neck:      Comments: R neck swelling, no active bleeding or strike  through. Steri strips in place  Cardiovascular:      Rate and Rhythm: Normal rate and regular rhythm.      Pulses: Normal pulses.   Pulmonary:      Effort: Pulmonary effort is normal.      Breath sounds: Normal breath sounds.   Abdominal:      General: Abdomen is flat.      Palpations: Abdomen is soft.   Neurological:      General: No focal deficit present.      Mental Status: He is alert and oriented to person, place, and time.      Sensory: No sensory deficit.                                                 Active Wounds          Wounds Surgical incision Anterior;Right Neck (Active)   11/14/23 0802   Wound Type: Surgical incision   Orientation: Anterior;Right   Location: Neck   Wound Location Comments:    Initial Wound Site Closure: Sutures   Initial Dressing Placed: Gauze;Mastisol;Hypafix tape   Initial Cycle:    Initial Suction Setting (mmHg):    Pressure Injury Stages:    Pressure Injury Present Within 24 Hours of Hospital Admission:    If This Pressure Injury Is Suspected to Be Device Related, Please Select the Device::    Is the Wound Open or Closed:    Wound Assessment Dressing not removed for assessment 11/15/23 0400   Peri-wound Assessment Edema;Intact 11/15/23 0400   Wound Drainage Amount None 11/15/23 0400   Wound Dressing Status Intact 11/15/23 0400   Number of days: 1          ___________________________    Moyses Pavey K [place], MD   Service Pager: # [phone]

## 2023-11-15 NOTE — Progress Notes
 END OF SHIFT SUMMARY [facility]28    Admission Date: 11/14/2023  Length of Stay: LOS: 1 day    Acute events and nursing interventions: pt complaining of increasing swelling around surgical site. Dr. Jearlean Mince to bedside to assess.     Communication with providers (include name/title): Dr. Grover Ledger    Did patient sleep overnight? No, sleep aid kit provided    Patient demeanor and cognition: calm, cooperative    Pain  Was the patient's pain controlled this shift? Yes  If no, what new interventions were implemented?          Is the patient intubated? No    Patient Education  Patient education provided: Mobility goals, Pain management, Post procedure care, and Skin integrity and wound care  Other patient education provided:  Learners: Patient  Method/materials used: Verbal teaching  Response to learning: Verbalizes Understanding    Patient Goal(s):  Patient will Improve nutritional intake and Report pain is controlled/improved by discharge date

## 2023-11-15 NOTE — Case Management (ED)
 Case Management Admission Assessment    NAME:Mason Lawson                          MRN: [id number]             DOB:1946-06-28          AGE: 78 y.o.  ADMISSION DATE: 11/14/2023             DAYS ADMITTED: LOS: 1 day      Today?s Date: 11/15/2023    Source of Information: This CM met with pt for assessment on this date.  Provided contact information and explanation of SW/NCM roles.  Reviewed [facility], Preparing for Discharge, and [facility] hand-outs.  Provided opportunity for questions and discussion. Pt/family encouraged to contact Case Management team with questions and concerns during hospitalization and until patient is able to transition back to the patient's primary care physician.    Per EMR pt is POD 1 from R CEA with Mason Lawson. Pt admitted to SICU post-operatively for brief period of hypotension that resolved quickly. Pt now downgraded to med-surg and awaiting transfer out of SICU to floor.     Plan  Plan: Assist PRN with SW/NCM Services  Most recent therapy recommendations:  PT: PT Discharge Recommendations Recommendation:  (likely safe to d/c home in 1-2 days) Therapist  Mason Lawson, PT, DPT  OT: Awaiting recs  CM needs are not fully known, but may include ongoing assessment for potential DC needs.  NCM/SW team to continue to follow patient's plan of care via EMR and team huddle; will assist with discharge planning needs as indicated.     Assessment Notes  Patient is agreeable to completing assessment at this time.   NCM provided contact information and an explanation of CM roles. Patient encouraged to contact case management with questions and concerns during hospitalization.   Patient lives with wife in 3 story home that accomodates single level living. Patient is typically independent in all ADLs and mobility devices.  Home support is assessed to be wife, Mason Lawson.  Patient's previous HH, LTACH, SNF, IPR, DME, outpatient therapy experience includes: pt denies history of all post-acute services  PCP confirmed to be Mason Ardyth Kroner MD. Last visit was within the previous month.  Primary coverage through [facility]. Part D coverage through [facility].  Denies concerns with medication access or affordability.  Pt wife will provide transport home at DC.  Pt follows with outpatient Cardiologist Mason Lawson at [facility].    Patient Address/Phone  [address] [place]  [place] [address]  [phone] (home)     Emergency Contact  Extended Emergency Contact Information  Primary Emergency Contact: Lawson,Mason  Address: [address]           [place], [place] [address] [place]   Home Phone: [phone]  Mobile Phone: [phone]  Relation: Spouse  Secondary Emergency Contact: Lawson,Mason  Address: [address] [place] [address] [place]   Mobile Phone: [phone]  Relation: Son    Network engineer Directive: No, patient does not have a healthcare directive  Would patient like to fill out a (a new) Editor, commissioning?: No, patient declined  Psych Advance Directive (Psych unit only): No, patient does not have a Health visitor  Does the Patient Need Case Management to Arrange Discharge Transport? (ex: facility, ambulance, wheelchair/stretcher, Medicaid, cab, other): No  Will the Patient Use Family Transport?: Yes  Transportation Name, Phone and Availability #1: Wife Mason Lawson - [phone]    Expected Discharge Date  11/16/2023 2:00 PM    Living Situation Prior to Admission  Living Arrangements  Type of Residence: Home, independent  Living Arrangements: Spouse/significant other  How many levels in the residence?: 3  Can patient live on one level if needed?: Yes  Does residence have entry and/or inside stairs?: Yes (Ramp entry)  Assistance needed prior to admit or anticipated on discharge: Yes  Who provides assistance or could if needed?: Wife - Mason Lawson - [phone]  Are they in good health?: Yes  Can support system provide 24/7 care if needed?: Maybe  Level of Function   Prior level of function: Independent  Cognitive Abilities   Cognitive Abilities: Alert and Oriented, Engages in problem solving and planning, Participates in decision making, Recognizes impact of health condition on lifestyle, Understands nature of health condition    Financial Resources  Coverage  Primary Insurance: Medicare Replacement  Secondary Insurance: No insurance  Additional Coverage: Other (Comment) (Part D)  Medication Coverage    Medication Coverage: Medicare Part D  Medicare Part D Plan: [employer]  Have you experienced a noticeable increase in your copay costs recently?: No  Are current medications affordable?: Yes  Do You Use a Co-Pay Card or a Medication Assistance Program to Help Manage Medication Costs?: No  Do You Manage Your Own Medications?: Yes  Source of Income   Source Of Income: SSI  Financial Assistance Needed?  None    Psychosocial Needs  Mental Health  Mental Health History: No  Substance Use History  Substance Use History Screen: In the past  Comment: Former smoker  Other  None    Current/Previous Services  PCP  Mason Lawson, [phone], [phone]  Pharmacy  [facility] - [place], [place] - [address]  [phone]  [place] [address]  Phone: [phone] Fax: [phone]    [facility] [place], [place] - [address] [place]  [address] [place]  [place] [address]  Phone: [phone] Fax: [phone]    [facility] - [address] - [place], [place] - [address]  [address] [place] [address]  Phone: [phone] Fax: [phone]    [facility] (Now [facility]) - [address] [place], [place] - [address]  [address]  [place] [address]  Phone: [phone] Fax: [phone]    [facility] [place], [place] - [address]  [address]  [place] [address]  Phone: [phone] Fax: [phone]    Durable Medical Equipment   Durable Medical Equipment at home: None  Home Health  Receiving home health: No  Hemodialysis or Peritoneal Dialysis  Undergoing hemodialysis or peritoneal dialysis: No  Tube/Enteral Feeds  Receive tube/enteral feeds: No  Infusion  Receive infusions: No  Private Duty  Private duty help used: No  Home and Community Based Services  Home and community based services: No  Ryan White  Ryan White: N/A  Hospice  Hospice: No  Outpatient Therapy  PT: No  OT: No  SLP: No  Skilled Nursing Facility/Nursing Home  SNF: No  NH: No  Inpatient Rehab  IPR: No  Long-Term Acute Care Hospital  LTACH: No  Acute Hospital Stay  Acute Hospital Stay: In the past  Was patient's stay  within the last 30 days?: No    Gleda Lan BSN, RN, CCRN  Nurse Case Manager  CTS (A-H)/Vascular Surgery  Available on [place]  Phone: [phone]  Pager: [phone]

## 2023-11-16 ENCOUNTER — Encounter: Admit: 2023-11-16 | Discharge: 2023-11-16 | Payer: MEDICARE

## 2023-11-16 DIAGNOSIS — I2510 Atherosclerotic heart disease of native coronary artery without angina pectoris: Secondary | ICD-10-CM

## 2023-11-16 DIAGNOSIS — Z951 Presence of aortocoronary bypass graft: Secondary | ICD-10-CM

## 2023-11-16 DIAGNOSIS — I5032 Chronic diastolic (congestive) heart failure: Secondary | ICD-10-CM

## 2023-11-16 DIAGNOSIS — Z7984 Long term (current) use of oral hypoglycemic drugs: Secondary | ICD-10-CM

## 2023-11-16 DIAGNOSIS — Z8249 Family history of ischemic heart disease and other diseases of the circulatory system: Secondary | ICD-10-CM

## 2023-11-16 DIAGNOSIS — I959 Hypotension, unspecified: Secondary | ICD-10-CM

## 2023-11-16 DIAGNOSIS — Z87891 Personal history of nicotine dependence: Secondary | ICD-10-CM

## 2023-11-16 DIAGNOSIS — E1151 Type 2 diabetes mellitus with diabetic peripheral angiopathy without gangrene: Secondary | ICD-10-CM

## 2023-11-16 DIAGNOSIS — Z7982 Long term (current) use of aspirin: Secondary | ICD-10-CM

## 2023-11-16 DIAGNOSIS — Z79890 Hormone replacement therapy (postmenopausal): Secondary | ICD-10-CM

## 2023-11-16 DIAGNOSIS — J449 Chronic obstructive pulmonary disease, unspecified: Secondary | ICD-10-CM

## 2023-11-16 DIAGNOSIS — I495 Sick sinus syndrome: Secondary | ICD-10-CM

## 2023-11-16 DIAGNOSIS — I480 Paroxysmal atrial fibrillation: Secondary | ICD-10-CM

## 2023-11-16 DIAGNOSIS — Z950 Presence of cardiac pacemaker: Secondary | ICD-10-CM

## 2023-11-16 DIAGNOSIS — I110 Hypertensive heart disease with heart failure: Secondary | ICD-10-CM

## 2023-11-16 LAB — POC GLUCOSE
~~LOC~~ BKR POC GLUCOSE: 142 mg/dL — ABNORMAL HIGH (ref 70–100)
~~LOC~~ BKR POC GLUCOSE: 127 mg/dL — ABNORMAL HIGH (ref 70–100)

## 2023-11-16 MED ORDER — NALOXONE 4 MG/ACTUATION NA SPRY
NASAL | 1 refills | 1.00000 days | Status: AC
Start: 2023-11-16 — End: ?

## 2023-11-16 MED ORDER — HYDROCODONE-ACETAMINOPHEN 5-325 MG PO TAB
1.5 | ORAL_TABLET | ORAL | 0 refills | 30.00000 days | Status: AC | PRN
Start: 2023-11-16 — End: ?

## 2023-11-16 MED ORDER — POLYETHYLENE GLYCOL 3350 17 GRAM PO PWPK
17 g | Freq: Every day | ORAL | 0 refills | 22.00000 days | Status: AC | PRN
Start: 2023-11-16 — End: ?

## 2023-11-16 MED ORDER — ACETAMINOPHEN 325 MG PO TAB
650 mg | ORAL | 0 refills | 8.00000 days | Status: AC | PRN
Start: 2023-11-16 — End: ?

## 2023-11-16 MED ORDER — PHENOL 1.4 % MM SPRA
2 | OROMUCOSAL | 0 refills | Status: AC | PRN
Start: 2023-11-16 — End: ?

## 2023-11-16 NOTE — Progress Notes
 Daily Progress Note      Date of Service:  11/16/2023  Name:  Mason Lawson                       MRN:  [id number]   Admission Date: 11/14/2023 (LOS: 2 days)                     Assessment:   Mason Lawson is a 78 yo M with CAD s/p CABG, afib s/p watchman, HTN, COPD, DM, GERD and aortic stenosis s/p TAVR, bl carotid artery stenosis s/p R CEA (5/14 [place]).      2 Days Post-Op from Procedure(s) (LRB):  RIGHT CAROTID ENDARTERECTOMY (Right) (11/14/2023).    Principal Problem:    Right-sided extracranial carotid artery stenosis  Active Problems:    Coronary artery disease    Hypercholesterolemia    Hypothyroidism    Hypertension    Chronic diastolic heart failure, NYHA class 2 (CMS-HCC)    DM (diabetes mellitus) type II, controlled, with peripheral vascular disorder (CMS-HCC)    Cardiac pacemaker    PAF (paroxysmal atrial fibrillation) (CMS-HCC)    Presence of Watchman left atrial appendage closure device    Stage 1 mild COPD by GOLD classification (CMS-HCC)    S/p TAVR (transcatheter aortic valve replacement), bioprosthetic    PAD (peripheral artery disease)    Carotid stenosis, right      Plan:  - Advance diet as tolerated  - ASA/statin  - Neurointact  - Continue monitoring for increased swelling of R neck, difficulty breathing or swallowing  - PT/OT  - Encourage IS use  - Planning for DC today      Discussed  with Dr. Lance Pinch  _____________________________________________________________________________    Subjective:  No acute events overnight. Neurointact. Reports he has not yet ambulated. Tolerated clears. Denies difficulty swallowing or change in voice.     Objective:  BP: (97-188)/(50-76)   Temp:  [36.8 ?C (98.3 ?F)-37.8 ?C (100.1 ?F)]   Pulse:  [60-81]   Respirations:  [15 PER MINUTE-16 PER MINUTE]   SpO2:  [88 %-97 %]   O2 Device: Nasal cannula  O2 Liter Flow: 2 Lpm  Body mass index is 29.09 kg/m?[name]      Lab Results   Component Value Date/Time    HGB 13.5 11/16/2023 04:08 AM    HCT 41.3 11/16/2023 04:08 AM    WBC 8.70 11/16/2023 04:08 AM    PLTCT 156 11/16/2023 04:08 AM    INR 1.2 06/04/2023 02:07 PM     Lab Results   Component Value Date/Time    NA 137 11/16/2023 04:08 AM    K 4.0 11/16/2023 04:08 AM    CL 104 11/16/2023 04:08 AM    CO2 24 11/16/2023 04:08 AM    BUN 15 11/16/2023 04:08 AM    CR 0.70 11/16/2023 04:08 AM    MG 1.7 06/05/2023 03:45 AM    PO4 4.2 03/28/2023 03:57 AM    CA 7.6 (L) 11/16/2023 04:08 AM     Lab Results   Component Value Date/Time    GLUPOC 127 (H) 11/16/2023 06:22 AM    GLUPOC 142 (H) 11/15/2023 10:27 PM    GLUPOC 130 (H) 11/15/2023 04:09 PM          Physical Exam  Constitutional:       Appearance: Normal appearance.   Neck:      Comments: R neck swelling, no active bleeding or strike through. Steri strips in  place  Cardiovascular:      Rate and Rhythm: Normal rate and regular rhythm.      Pulses: Normal pulses.   Pulmonary:      Effort: Pulmonary effort is normal.      Breath sounds: Normal breath sounds.   Abdominal:      General: Abdomen is flat.      Palpations: Abdomen is soft.   Neurological:      General: No focal deficit present.      Mental Status: He is alert and oriented to person, place, and time.      Sensory: No sensory deficit.                                                 Active Wounds          Wounds Surgical incision Anterior;Right Neck (Active)   11/14/23 0802   Wound Type: Surgical incision   Orientation: Anterior;Right   Location: Neck   Wound Location Comments:    Initial Wound Site Closure: Sutures   Initial Dressing Placed: Gauze;Mastisol;Hypafix tape   Initial Cycle:    Initial Suction Setting (mmHg):    Pressure Injury Stages:    Pressure Injury Present Within 24 Hours of Hospital Admission:    If This Pressure Injury Is Suspected to Be Device Related, Please Select the Device::    Is the Wound Open or Closed:    Wound Assessment Dressing not removed for assessment 11/16/23 0400   Peri-wound Assessment Edema;Intact 11/16/23 0400   Wound Drainage Amount None 11/16/23 0400   Wound Dressing Status Intact 11/16/23 0400   Number of days: 2          ___________________________    Libbie Redwood, MD   Service Pager: # [phone]

## 2023-11-16 NOTE — Discharge Instructions - Pharmacy
 Discharge Summary      Name: Mason Lawson  Medical Record Number: [id number]        Account Number:  [id number]  Date Of Birth:  [birth year]                         Age:  78 y.o.  Admit date:  11/14/2023                     Discharge date: 11/16/2023      Discharge Attending:  Raynald Calkins, DO  Discharge Summary Completed By: Wauneta Haddock, APRN-NP    Service: Surgery-Vascular - [phone]    Reason for hospitalization:  Carotid stenosis, asymptomatic, right [I65.21]  Carotid stenosis, right [I65.21]    Primary Discharge Diagnosis:   Right-sided extracranial carotid artery stenosis    Hospital Diagnoses:  Hospital Problems        Active Problems    * (Principal) Right-sided extracranial carotid artery stenosis    Coronary artery disease    Hypercholesterolemia    Hypothyroidism    Hypertension    Chronic diastolic heart failure, NYHA class 2 (CMS-HCC)    DM (diabetes mellitus) type II, controlled, with peripheral vascular   disorder (CMS-HCC)    Cardiac pacemaker    PAF (paroxysmal atrial fibrillation) (CMS-HCC)    Presence of Watchman left atrial appendage closure device    Stage 1 mild COPD by GOLD classification (CMS-HCC)    S/p TAVR (transcatheter aortic valve replacement), bioprosthetic    PAD (peripheral artery disease)    Carotid stenosis, right     Present on Admission:   Right-sided extracranial carotid artery stenosis   PAD (peripheral artery disease)   Hypertension   Hypercholesterolemia   Coronary artery disease   Hypothyroidism   Cardiac pacemaker   Chronic diastolic heart failure, NYHA class 2 (CMS-HCC)   DM (diabetes mellitus) type II, controlled, with peripheral vascular disorder (CMS-HCC)   PAF (paroxysmal atrial fibrillation) (CMS-HCC)   Presence of Watchman left atrial appendage closure device   Stage 1 mild COPD by GOLD classification (CMS-HCC)   S/p TAVR (transcatheter aortic valve replacement), bioprosthetic   Carotid stenosis, right        Significant Past Medical History        Abnormal stress test  Atrial fibrillation (CMS-HCC)  Cardiac pacemaker  Chest pain  Cigarette smoker  COPD (chronic obstructive pulmonary disease) (CMS-HCC)  Coronary artery disease  Former cigarette smoker  GERD (gastroesophageal reflux disease)  Hypercholesterolemia  Hypertension  Hypothyroidism  Presence of Watchman left atrial appendage closure device      Comment:  02/20/2022 - Dr. Raymonde Calico; Left atrial appendage occlusion               with 31 mm Watchman FLX device  Tests ordered  Undiagnosed cardiac murmurs  Valvular heart disease    Allergies   Cefdinir, Oxycodone, and Percodan [oxycodone hcl-oxycodone-asa]    Brief Hospital Course   The patient was admitted and the following issues were addressed during this hospitalization: (with pertinent details including admission exam/imaging/labs).      Patient underwent Right Carotid Endarterectomy on 11/14/2022 by vascular surgeon Kathlen Para, MD.  Patient tolerated the procedure well with no intraoperative complications.  Immediate postoperatively patient experience hypotension (83/48 to 90's/40's) while in PACU and Levophed IV drip was started.  Patient was then transferred to SICU for close monitoring.  The Levophed was wean off quickly and he did not  require anymore pressor support.  He progressed as expected, stayed two nights in the hospital for observation.  Prior to discharge, patient was tolerating oral diet without nausea or vomiting, pain was well controlled on an oral regimen, ambulating, and having bowel function. Patient was encouraged to continue to walk around as able after discharge.   Postop day two patient was deemed stable for discharge.  All postop care instructions were discussed with patient in detail and written discharge instructions were provided to patient as reproduced below.  Patient was discharged to home with family member in stable condition to follow up in the Vascular Surgery clinic on 12/10/2023 with clinic APP Franco Isaac.  Patient was instructed to continue taking daily low dose aspirin, rosuvastatin, and Repatha.       Day of discharge exam notable for: neuro examination negative for focal deficit. BP controlled at 131/50, R neck incision well approximated and without drainage with steri-strips in place.  Patient was ambulating at base line, was tolerating diet, pain was well controlled on Hydrocodone, had bowel function and was urinating without difficulty.      Items Needing Follow Up   Pending items or areas that need to be addressed at follow up: assess incision healing.    Pending Labs and Follow Up Radiology    Pending labs and/or radiology review at this time of discharge are listed below: Please note- any labs with collected status will not have a result; if this area is blank, there are no items for review.   Pending Labs       Order Current Status    MISCELLANEOUS LAB TEST Collected (11/14/23 0935)              Medications      Medication List      START taking these medications     acetaminophen 325 mg tablet; Commonly known as: TYLENOL; Dose: 650 mg;   Take two tablets by mouth every 4 hours as needed. TOTAL ACETAMINOPHEN   DOSE NOT TO EXCEED 4GM DAILY  Indications: pain; For: pain; Refills: 0   HYDROcodone/acetaminophen 5/325 mg tablet; Commonly known as: NORCO;   Dose: 1.5 tablet; Take 1.5 tablets by mouth every 4 hours as needed.   Indications: acute postoperative pain; For: acute postoperative pain;   Quantity: 20 tablet; Refills: 0   naloxone 4 mg/actuation nasal spray; Commonly known as: NARCAN; Insert 1   spray into 1 nostril as needed for signs of opioid overdose then call 911.   May repeat dose every 2-3 minutes (alternate nostrils) until medical team   arrives.  Indications: decrease in rate & depth of breathing due to opioid   drug; For: decrease in rate & depth of breathing due to opioid drug;   Quantity: 2 each; Refills: 1   phenoL 1.4 % mouth spray; Commonly known as: CHLORASEPTIC; Dose: 2   spray; Take two sprays by mouth or throat as directed as Needed.   Indications: throat irritation; For: throat irritation; Refills: 0   polyethylene glycol 3350 17 g packet; Commonly known as: MIRALAX; Dose:   17 g; Take one packet by mouth daily as needed. Indications: constipation;   For: constipation; Refills: 0     CONTINUE taking these medications     aspirin EC 81 mg tablet; Commonly known as: ASPIR-LOW; Dose: 81 mg; Take   one tablet by mouth daily. Indications: s/p Watchman; For: s/p Watchman;   Quantity: 90 tablet; Refills: 1   bisoprolol fumarate 5 mg tablet;  Commonly known as: ZEBETA; Dose: 5 mg;   Take one tablet by mouth twice daily.; Quantity: 180 tablet; Refills: 3   calcium carbonate 500 mg (200 mg elemental calcium) chewable tablet;   Commonly known as: TUMS; Dose: 1 tablet; Refills: 0   coenzyme Q10 100 mg capsule; Commonly known as: CO Q-10; Dose: 100 mg;   Refills: 0   Cranberry 500 mg Cap; Dose: 500 mg; Refills: 0   cyanocobalamin (vitamin B-12) 1,000 mcg tablet; Dose: 1,000 mcg;   Refills: 0   ezetimibe 10 mg tablet; Commonly known as: ZETIA; TAKE 1 TABLET EVERY   DAY; Quantity: 90 tablet; Refills: 3   ferrous sulfate 325 mg (65 mg iron) tablet; Commonly known as: FEOSOL;   Dose: 325 mg; Refills: 0   furosemide 40 mg tablet; Commonly known as: LASIX; Take 1/2-1 tablet by   mouth daily as directed for swelling and weight gain.; Quantity: 90   tablet; Refills: 3   GLUCOSAMINE-CHONDROITIN COMPLX PO; Dose: 1 tablet; Refills: 0   levothyroxine 175 mcg tablet; Commonly known as: SYNTHROID; Dose: 175   mcg; Refills: 0   losartan 100 mg tablet; Commonly known as: COZAAR; Dose: 100 mg; TAKE 1   TABLET EVERY DAY; Quantity: 90 tablet; Refills: 3   magnesium glycinate 100 mg magnesium capsule; Dose: 100 mg; Refills: 0   metFORMIN 500 mg tablet; Commonly known as: GLUCOPHAGE; Dose: 500 mg;   Take one tablet by mouth twice daily with meals. Ok to resume 06/06/23    Indications: type 2 diabetes mellitus; For: type 2 diabetes mellitus; Quantity: 60 tablet; Refills: 3   multivitamin tablet; Commonly known as: ONE-A-DAY; Dose: 1 tablet;   Refills: 0   omeprazole DR 20 mg capsule; Commonly known as: PriLOSEC; Dose: 20 mg;   Refills: 0   potassium chloride SR 20 mEq tablet; Commonly known as: KLOR-CON M20;   Dose: 20 mEq; Take one tablet by mouth daily. Take while taking   furosemide.  Indications: prevention of low potassium in the blood; For:   prevention of low potassium in the blood; Quantity: 90 tablet; Refills: 0   REPATHA SURECLICK 140 mg/mL injectable PEN; Generic drug: evolocumab;   Dose: 140 mg; Inject 1 mL under the skin every 14 days.; Quantity: 6 mL;   Refills: 3   rosuvastatin 20 mg tablet; Commonly known as: CRESTOR; Dose: 20 mg; TAKE   1 TABLET EVERY DAY; Quantity: 90 tablet; Refills: 3   VITAMIN D3 50 mcg (2,000 unit) tablet; Generic drug: CHOLEcalciferoL   (vitamin D3); Dose: 2,000 Units; Refills: 0       Return Appointments and Scheduled Appointments     Scheduled appointments:      Nov 29, 2023 10:30 AM  Office visit with Marja Sierra, MD  Cardiovascular Medicine: [facility] (CVM Exam) [address]  [address], [address]  [place] [address]  [phone]     Dec 10, 2023 1:30 PM  Vascular Post Op with Terie Fermo, APRN-NP  [facility]: [facility] [phone] (Surgery) [address]  [address], [address]  [place] [address]  [phone]            Consults, Procedures, Diagnostics, Micro, Pathology   Consults: None  Surgical Procedures & Dates:  as noted above  Significant Diagnostic Studies, Micro and Procedures: none  Significant Pathology: none     Wound:      Wounds Surgical incision Anterior;Right Neck (Active)  11/14/23 0802   Wound Type: Surgical incision   Orientation: Anterior;Right   Location: Neck   Wound Location Comments:    Initial Wound Site Closure: Sutures   Initial Dressing Placed: Gauze;Mastisol;Hypafix tape   Initial Cycle:    Initial Suction Setting (mmHg):    Pressure Injury Stages:    Pressure Injury Present Within 24 Hours of Hospital Admission:    If This Pressure Injury Is Suspected to Be Device Related, Please Select the Device::    Is the Wound Open or Closed:    Wound Assessment Dry 11/16/23 0900   Wound Site Closure Steri-strips 11/16/23 0900   Peri-wound Assessment Edema 11/16/23 0900   Wound Drainage Amount None 11/16/23 0900   Wound Dressing Status None/open to air 11/16/23 0900   Number of days: 2                        Discharge Disposition, Condition   Patient Disposition: Home or Self Care [01]  Condition at Discharge: Stable    Code Status   Full Code    Patient Instructions     Activity       Activity as Tolerated   As directed      It is important to keep increasing your activity level after you leave the hospital.  Moving around can help prevent blood clots, lung infection (pneumonia) and other problems.  Gradually increasing the number of times you are up moving around will help you return to your normal activity level more quickly.  Continue to increase the number of times you are up to the chair and walking daily to return to your normal activity level. Begin to work toward your normal activity level at discharge.    Bathing Restrictions   As directed      You may show when you get home.    Driving Restrictions   As directed      No driving while taking pain medication and until you can turn you head well enough to be able to drive safely.    Lifting Restrictions   As directed      Do not lift more than 10 pounds for 2 week(s).    Other Activity Restrictions   As directed      Your incision is closed with internal sutures.  Wash area gently with soap and water as needed to keep area clean and dry. Pat dry with a clean towel.  Usually there are no visible sutures to be removed. Steri-strips which are strips of tape (if present) will begin to fall off in 10-14 days. If they remain after 2 weeks, gently remove them when they are damp after a shower. Don't be alarmed if you have some loss of feeling along your jaw line, the incision line, and earlobe.  This is a result of the incision and usually goes away after 6 to 12 months.      No driving while you are taking pain medication and until you are able to turn your head unimpeded to view traffic, or until released to do so by your Vascular Surgeon.  Do not lift anything heavier than 10 pounds for 2 to 3 weeks after your surgery.    Ask your doctor when you can expect to return to work.  Avoid strenuous activity for 7 to 10 days after your surgery.     Notify the vascular surgery clinic at [phone] if any of these symptoms occur:  sudden increase in swelling of the neck.  - Bleeding from the incision line.   - Increased drainage or drainage that is yellow, yellow-green, or foul-smelling  - Increased swelling or pain, or redness or swelling in the skin around the incision  - If the an opening develops along the incision line  - A fever of 100.4?F (38?C) or higher, or as directed by your healthcare provider  - Chills, increased fatigue, or a loss of appetite  - Headache, particularly if it does not go away after a couple of hours  - Numbness or weakness in your face, arms, or legs  - Sudden changes in your vision  - Loss of vision in 1 eye  - Trouble speaking  - Trouble swallowing  - If you have trouble breathing call 9-1-1.    Follow-up with your Primary Care Provider in the next 1-2 weeks.    You will be following up with a Nurse Practitioner or Physician from Vascular Surgery.    Strenuous Activity Restrictions   As directed      Please refrain from strenuous activity for 2 week(s).          Diet       Diabetic Diet   As directed      You should eat between 1600 and 2000 calories per day.  This is equal to 60g (grams) of carbohydrates per meal, and 30g of carbohydrates for a bedtime snack.    If you have questions about your diet after you go home, you can call a dietitian at [phone]. Wounds/Lines/Drains       Incision Care   As directed      INCISION CARE  Your incision is closed with internal sutures.  Wash area gently with soap and water as needed to keep area clean and dry. Pat dry with a clean towel.  Usually there are no visible sutures to be removed. Steri-strips which are strips of tape (if present) will begin to fall off in 10-14 days. If they remain after 2 weeks, gently remove them when they are damp after a shower. Don't be alarmed if you have some loss of feeling along your jaw line, the incision line, and earlobe.  This is a result of the incision and usually goes away after 6 to 12 months.             Discharge education provided to patient., Signs and Symptoms:   Report these signs and symptoms       Report These Signs and Symptoms   As directed      Please contact your doctor if you have any of the following symptoms: temperature higher than 100.4 degrees F, uncontrolled pain, persistent nausea and/or vomiting, difficulty breathing, chest pain, severe abdominal pain, headache, unable to urinate, unable to have bowel movement, or drainage with a foul odor.    Notify the vascular surgery clinic at [phone] if any of these symptoms occur:   sudden increase in swelling of the neck.  - Bleeding from the incision line.   - Increased drainage or drainage that is yellow, yellow-green, or foul-smelling  - Increased swelling or pain, or redness or swelling in the skin around the incision  - If the an opening develops along the incision line  - A fever of 100.4?F (38?C) or higher, or as directed by your healthcare provider  - Chills, increased fatigue, or a loss of appetite  - Headache, particularly if it does not go away after a  couple of hours  - Numbness or weakness in your face, arms, or legs  - Sudden changes in your vision  - Loss of vision in 1 eye  - Trouble speaking  - Trouble swallowing  - If you have trouble breathing call 9-1-1.        , Education:   Education CEA Education about Stroke   As directed      If you have carotid stenosis and have been treated with a carotid artery stent or carotid endarterectomy, you are at risk for stroke. It is important for you to recognize the signs of stroke and call 911 immediately.    B.E. F.A.S.T. is an easy way to remember the sudden signs of stroke.    B.E. F.A.S.T. Stands for...  B  -  Balance  Is there a sudden difficulty with balance or coordination? Is walking or sitting difficult?  E  -  Eyes  Is there a sudden change in eyesight such as blurred vision, double vision, or a loss of vision in one or both eyes without pain?   F  -  Face   Does one side of the face droop or is it numb?  Ask the person to smile.  A  -  Arm   Is one arm weak or numb?  Ask the person to raise both arms.  Does one arm drift downward?  S  -  Speech   Is speech slurred or difficult to understand?  Are they unable to speak at all?  Ask the person to repeat is simple sentence like It is a bright and sunny day.    T  -  Time to call 911!  If a person shows any of these symptoms, even if they go away, call 911 to get them to the hospital immediately.  Time is very important.  The sooner they get to the hospital, the more likely they are to receive stroke reversing and potentially life-saving treatment.    Naloxone Nasal Spray Instructions   As directed      Naloxone nasal spray  Brand Name: Narcan  What is this medicine?  NALOXONE (nal OX one) is a narcotic blocker. It is used to treat narcotic drug overdose.    What are the signs of narcotic drug overdose?  The person does not wake up after shaking their shoulders, shouting their name or firmly rubbing the middle of their chest; very slow breathing or no breaths at all; center black part of the eye (pupil) is very small or pinpoint.      How should I use this medicine?  This medicine is for use in the nose. Lay the person on their back. Support their neck with your hand and allow the head to tilt back before giving the medicine. The nasal spray should be given into 1 nostril. After giving the medicine, move the person onto their side. Do not remove or test the nasal spray until ready to use. If the person is not awake after 2-3 minutes, give a second dose in the other nostril.  Get emergency medical help right away after giving the first dose of this medicine, even if the person wakes up. You should be familiar with how to recognize the signs and symptoms of a narcotic overdose. Talk to your pediatrician regarding the use of this medicine in children. While this drug may be prescribed for children as young as newborns for selected conditions, precautions do apply.    What side  effects may I notice from receiving this medicine?  Side effects that you should report to your doctor or health care professional as soon as possible:  ? allergic reactions like skin rash, itching or hives, swelling of the face, lips, or tongue  ? breathing problems  ? fast, irregular heartbeat  ? high blood pressure  ? pain that was controlled by narcotic pain medicine  ? seizures  Side effects that usually do not require medical attention (report to your doctor or health care professional if they continue or are bothersome):  ? anxious  ? chills  ? diarrhea  ? fever  ? headache  ? muscle pain  ? nausea, vomiting  ? nose irritation like dryness, congestion, and swelling  ? sweating  What may interact with this medicine?  This medicine is only used during an emergency. No interactions are expected during emergency use.  What if I miss a dose?  This does not apply.  Where should I keep my medicine?  Keep out of the reach of children.  Store between 4 and 40 degrees C (39 and 104 degrees F). Do not freeze. Throw away any unused medicine after the expiration date. Keep in original box until ready to use.  What should I tell my health care provider before I take this medicine?  They need to know if you have any of these conditions:  ? drug abuse or addiction  ? heart disease  ? an unusual or allergic reaction to naloxone, other medicines, foods, dyes, or preservatives  ? pregnant or trying to get pregnant  ? breast-feeding  What should I watch for while using this medicine?  Keep this medicine ready for use in the case of a narcotic overdose. Make sure that you have the phone number of your doctor or health care professional and local hospital ready. You may need to have additional doses of this medicine. Each nasal spray contains a single dose. Some emergencies may require additional doses.  After use, bring the treated person to the nearest hospital or call 911. Make sure the treating health care professional knows that the person has received an injection of this medicine. You will receive additional instructions on what to do during and after use of this medicine before an emergency occurs.  NOTE:This sheet is a summary. It may not cover all possible information. If you have questions about this medicine, talk to your doctor, pharmacist, or health care provider.        , and Others Instructions:   Other Orders       Additional Discharge Instructions   Complete by: As directed      Order comments: Please check you blood pressure 1-2 times per day and record on a written log.  Continue to do this until you return to clinic for your postoperative clinic follow up appointment.  Please bring your written log with you to this appointment.  If your Systolic blood pressure (SBP) (top number) is consistently greater than , please call and report this to the Vascular Surgery clinic at [phone] or your primary care doctor.  If you develop a headache with elevated blood pressure (SBP greater than ) please call the Vascular Surgery clinic to report this as well.    Diabetes Information   Complete by: As directed      Order comments: Diabetes is managed by checking your blood sugar, taking your medicine, exercising and eating healthy.    Target blood sugar range is 80 -  140 mg/dL    Check blood sugars as often as you have been instructed to by the medical provider who manages your diabetes.     Treatment of low blood sugars: If your blood sugar is low, you may feel shaky, weak, sweaty, hungry, confused, or have a headache. Check your blood sugar with a glucose meter. If it is below 70 treat with one of the following: juice (4 oz.) or regular soda (4 oz.) or 4-5 hard candies. Recheck your blood sugar in 15 minutes and repeat this until level is above 70.    Follow up with your doctor and inform them of any changes to your diabetes plan of care.  *Talk with your doctor before stopping any medication or if you need refills on supplies or medications.  *Eat three meals each day at regularly scheduled intervals (about 4-5 hours apart). It helps to control your blood sugar. Each meal should have equal amounts of carbohydrates.  *Check your feet every day for blisters, cuts, and redness. Call your doctor if you have an infection, wound or cut anywhere on your feet.  *Get a dilated eye exam each year.  *Wear a medic alert ID if you are on insulin.  *In an emergency contact your healthcare provider or go to the emergency room.    A Free Virtual Monthly Diabetes Survival Skills class is available for diabetes education on every 1st Tue  of each month from 1.30-2.30 pm (*except July).  This class is providing basic survival skills with information tailored to meet your needs. It's also great for people who want to refresh their diabetes knowledge after completing diabetes group classes.  Please Register Here: [email or web address] or  call [phone] for more information.  There is no cost and we will not bill your insurance.    More diabetes education and individual nutrition related to diabetes care appointments are offered at the [facility] for in person or virtual visits. Most insurance plans cover these services if you have a referral from your doctor. Call [phone] for details.    Questions About Your Stay   Complete by: As directed      Discharging attending physician: Raynald Calkins    Order comments: For questions or concerns regarding your hospital stay, please call the Vascular Surgery clinic at [phone].            Additional Orders: Case Management, Supplies, Home Health     Home Health/DME       None              Signed:  Wauneta Haddock, APRN-NP  11/16/2023      cc:  Primary Care Physician:  Roselynn Connors   Verified    Referring physicians:  Roselynn Connors, MD   Additional provider(s):        Did we miss something? If additional records are needed, please fax a request on office letterhead to [phone]. Please include the patient's name, date of birth, fax number and type of information needed. Additional request can be made by email at [email or web address]. For general questions of information about electronic records sharing, call [phone].

## 2023-11-16 NOTE — Progress Notes
 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.  Chaplain visited to offer prayer, support and sacramental care.   Patient's wife was present at bedside.  Patient appeared calm and hopeful but asked for prayer and sacramental care for healing.  Patient shared about his illness and surgery went well and ready for discharge.  Chaplain offered active listening, support, prayer and provided the Sacrament of the Sick / Last Rites for peace, hope and healing.  Patient appreciated for visit.  Continue available for support and prayer.     The spiritual care team is available as needed, 24/7, through the campus switchboard [phone]). For a response within 24 hours, please submit an order in [facility] for a chaplain consult.

## 2023-11-22 ENCOUNTER — Encounter: Admit: 2023-11-22 | Discharge: 2023-11-22 | Payer: MEDICARE

## 2023-11-29 ENCOUNTER — Encounter: Admit: 2023-11-29 | Discharge: 2023-11-29 | Payer: MEDICARE

## 2023-11-29 ENCOUNTER — Ambulatory Visit: Admit: 2023-11-29 | Discharge: 2023-11-29 | Payer: MEDICARE

## 2023-11-29 DIAGNOSIS — I10 Essential (primary) hypertension: Secondary | ICD-10-CM

## 2023-11-29 DIAGNOSIS — J9621 Acute and chronic respiratory failure with hypoxia: Secondary | ICD-10-CM

## 2023-11-29 DIAGNOSIS — I2510 Atherosclerotic heart disease of native coronary artery without angina pectoris: Secondary | ICD-10-CM

## 2023-11-29 DIAGNOSIS — Z953 Presence of xenogenic heart valve: Secondary | ICD-10-CM

## 2023-11-29 DIAGNOSIS — I739 Peripheral vascular disease, unspecified: Secondary | ICD-10-CM

## 2023-11-29 NOTE — Patient Instructions
 Follow-up in 6 months    Follow up as directed.  Call sooner if issues.  Call the [facility] nursing line at [phone].  Leave a detailed message for the nurse in [facility] with how we can assist you and we will call you back.

## 2023-11-29 NOTE — Progress Notes
 Date of Service: 11/29/2023    Xavion Muscat is a 78 y.o. male.       HPI   Mr. Seiden is followed for coronary artery diseases, sinus bradycardia, carotid artery disease, hyperlipidemia, aortic stenosis, hypertension, heart failure with preserved ejection fraction and paroxysmal atrial fibrillation.  He underwent successful transcatheter aortic valve replacement on June 04, 2023.  Most recently he underwent right carotid endarterectomy with a bovine pericardial patch angioplasty on 11/14/2023.  He has seen cardiac EP in clinic on 09/26/2023 for nonsustained ventricular tachycardia.  His bisoprolol was increased to 5 mg twice daily and his amlodipine was discontinued.  Recently he has been treated for left leg cellulitis and for an upper respiratory tract infection.  Otherwise, Mr. Mulrooney indicates that he has been stable and he reports no angina, congestive symptoms, palpitations, sensation of sustained forceful heart pounding, lightheadedness or syncope.  He is back to swimming 30 minutes 6 days a week.  The patient reports no myalgias, bleeding abnormalities, or strokelike symptoms.     Historically, Mr. Dusza underwent coronary artery bypass surgery in December 2005. At that time his revascularization consisted of an IMA graft to the LAD with a right internal mammary going to the obtuse marginal and a radial to the posterior descending artery. I saw Mr. Dudenhoeffer on 09/23/10 after an abnormal stress test was recently obtained. On 09/23/10 he told me that he had infrequent mild angina which he had noted over the past year. It occurred less than once a month and only when he was walking uphill and only when he was walking shortly after a meal. On 09/17/10 he walked 6 blocks in 10 minutes during a parade and had no angina because he had not eaten recently and was not walking uphill. I reviewed his stress test with him and he wanted to proceed with coronary angiography which was performed on 09/26/10. Intervention was not required. On 09/30/10 carvedilol was added to his medical regimen for BP control and he has tolerated this medication without difficulty. On 10/21/10 HCTZ 25 mg with triamterene 37.5 mg daily was also added to his medical regimen for improved BP control.  The patient says that he had cellulitis in his left lower extremity in September 2015 that resolved with antibiotic therapy. Mr. Caspers developed dyspnea with exertion and underwent right and left heart catheterization/coronary angiography on 06/18/2018. Intervention was not required.He reports that he developed Covid illness on July 07 2019 with mild to moderate symptoms.  Mr. Dials reports that he had a chest x-ray and there was no evidence for pneumonia.  He was placed on supplemental oxygen which he has used occasionally since developing Covid illness. He was hospitalized in [place] in April 2021 for hypertension with exacerbation of congestive heart failure.  . He had typical atrial flutter with bradycardic response and was hospitalized in July 2022 and seen by Dr. Bolivar Bushman on consult service.  On January 21, 2021 Dr. Clora Dane did cavotricuspid isthmus ablation.  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 Melton Squires implanted a dual-chamber pacemaker. Mr. Moradi underwent placement of a Watchman left atrial appendage occlusion device on 02/20/2022.  Follow-up transesophageal echocardiography performed on 04/05/2022 showed a well-seated Watchman device with no evidence of peridevice leak.  His permanent pacemaker then revealed episodes of nonsustained ventricular tachycardia and a stress test was obtained to assess for an ischemic trigger for these episodes.  It was abnormal and coronary angiography with coronary intervention to the  right posterior descending artery was performed on 06/22/2022.       Vitals:    11/29/23 1024   BP: 135/72   BP Source: Arm, Left Upper   Pulse: 73   SpO2: 97% O2 Device: None (Room air)   PainSc: Zero   Weight: 97.1 kg (214 lb)   Height: 180.3 cm (5' 11)     Body mass index is 29.85 kg/m?[name]     Past Medical History  Patient Active Problem List    Diagnosis Date Noted    Carotid stenosis, right 11/14/2023    Right-sided extracranial carotid artery stenosis 11/13/2023    PAD (peripheral artery disease) 11/13/2023    Stage 1 mild COPD by GOLD classification (CMS-HCC) 06/04/2023    S/p TAVR (transcatheter aortic valve replacement), bioprosthetic 06/04/2023     06/04/23: Dr. Aleck Hurdle and Dr. Jerilyn Monte       Bilateral carotid artery stenosis 05/30/2023    Atherosclerosis of aorta 05/29/2023    Former smoker 05/29/2023    Aortic valve disease 03/26/2023    Acute and chronic respiratory failure with hypoxia (CMS-HCC) 12/05/2022    Coronary artery disease due to lipid rich plaque 06/22/2022    Presence of Watchman left atrial appendage closure device 02/21/2022     02/20/2022 - Dr. Raymonde Calico; Left atrial appendage occlusion with 31 mm Watchman FLX device      PAF (paroxysmal atrial fibrillation) (CMS-HCC) 02/20/2022    Cardiac pacemaker 05/16/2021     01/21/2021 - Successful Dual-chamber Pacemaker Implantation with Dr. Melton Squires      Atrial fibrillation (CMS-HCC) 05/13/2021    Bradycardia 02/01/2021    DM (diabetes mellitus) type II, controlled, with peripheral vascular disorder (CMS-HCC) 02/01/2021    Paroxysmal nocturnal dyspnea 02/01/2021    Atrial flutter (CMS-HCC) 01/19/2021     01/21/2021 - AFL RFA with Dr. Clora Dane:  Successful cavo-tricuspid isthmus ablation.  Comprehensive EP study with coronary sinus catheter placement.  Underlying severe sinus node dysfunction. No escape rhythm.      Chronic diastolic heart failure, NYHA class 2 (CMS-HCC) 12/04/2018     RIGHT HEART CATHETERIZATION:  06/2018 in the setting of severe hypertension  HEMODYNAMICS:   Blood pressure 198/81 with a mean of 130 mmHg. Heart rate 50 beats per minute.   PRESSURES:  The right atrial pressure was 4 mmHg. Right ventricular pressure was 64/8 mmHg.  The pulmonary artery pressure was 60/70 mmHg with a mean of 32 mmHg. The pulmonary capillary wedge pressure was 17 mmHg. The transpulmonary gradient was 15 mmHg.  The diastolic pulmonary gradient was 0 mmHg. The pulmonary vascular resistance was 2 Wood units.   RIGHT SATURATIONS: The right atrial saturation 77%. The pulmonary artery saturation was 75%. The aortic saturation was 96%.   OUTPUTS: The cardiac output by Fick method was 5.8 L/minute with a cardiac index of 2.7 L/minute per sq m. The cardiac index by thermodilution was 7.3 with a cardiac index of 3.4 L/minute per sq m.      Dyslipidemia 10/23/2012    Chest pain 09/26/2010    Abnormal stress test 09/26/2010    Murmur 09/23/2010    Coronary artery disease      A. Onset mid 2004, progression fall 2005, patient reduces activity at work to avoid symptoms  B. 12/05 Progressive angina, Dr. Ottis Blood, then 06/29/04 CBP Atch OV, referred for cath          >12/29 Cath 100%RCA, 75%LAD&Cx,50%LM EF 55%          >07/01/05 CABGx3  LIMA-LAD w/ Y-RIMA-OM, Rad-PDA per Dr. Aleck Hurdle  C. 06/29/08 exercise echocardiographic images demonstrated basal septal ischemia, significant hypertensive response to exercise, good and adequate exercise tolerance with no angina.  D. LHC 06/2018 for new heart failure symptoms: LEFT HEART CATHETERIZATION:  HEMODYNAMICS:  The aortic pressure was- 195/63 with a mean of 105 mmHg .  The left ventricular systolic pressure was 210 mmHg.  There was a peak to peak gradient of 15 mmHg.  Left main coronary artery:  Left main coronary artery arises normally from the left coronary sinus.  The left main coronary artery bifurcates into left anterior descending and left circumflex artery and is free of angiographically significant disease.  Left anterior descending:  Left anterior descending is a large vessel which gives rise to- 3 diagonal vessels. 1 diagonal has a 80% ostial stenosis, the rest of diagonal vessels do not appear to be angiographically significant. Distal LAD has competitive flow from the LIMA.   Left circumflex artery:  The left circumflex artery appears to be medium caliber and has a 100% stenosis proximally. The mid-to-distal portion of the vessel fills from bridging collaterals from proximal portion and is diffusely disease. Three obtuse marginal branches arise, which are also diffusely diseased.   Right coronary artery:  The right coronary artery is a medium caliber vessel, but has 100% chronic total occlusion after the takeoff of conus branch. The PDA fills retrogradely from the radial graft to supply the PLV and has mild plaquing in the native PDA.    BYPASS GRAFT ANATOMY:  1.The LIMA to LAD was visualized using IM catheter.  The ostium, body was noted to be free of any angiographic disease, but has a 80% stenosis at the anastamosis with diffusely diseased distal LAD. There is a Y jump graft from the LIMA to the OM branch which fills retrogradely.            2. The radial artery graft to the PDA appears to be without any angiographically significant disease at ostium, body or anastomotic site.   E. Regadenosom MPI 11/2018 TID Ratio:  1.05  (normal <1.36). Summed Stress Score:  3   , Summed Rest Score:  0 Quantitative polar maps show a statistically insignificant amount of inferoapical ischemia. Mild inferobasilar septal hypokinesis.       Left Ventricular Ejection Fraction (post stress, in the resting state) =  52 %.  Left Ventricular End Diastolic Volume: 784 mL       SUMMARY/OPINION:   This study is borderline abnormal with limited reversible uptake in the inferior wall.  All segments are viable.  Global left ventricular function is within normal limits. In aggregate the current study is low risk in regards to predicted annual cardiovascular mortality rate.         Hypercholesterolemia      A.  02/28/01 total 324 total 249 HDL 43 LDL 231 Pt deferred meds  B. 06/29/04 total 273 trig 193 HDL 36 LDLd206 Zocor begun dc'd by pt d/t statinphobia and cost  C.  2/24 Rx Vytorin 10/80 1/2 tab.      Hypothyroidism      A.  2004 presentation with TSH over 200, Rx w l-thyroxine      Hypertension      A.  2/06 Altace 5 dc'd w/ dysgeusia, Metoprolol 25 BID begun         Tests ordered      A. 07/17/08 carotid artery duplex scan: no significant stenosis  Review of Systems   Constitutional: Negative.   HENT: Negative.     Eyes: Negative.    Cardiovascular:  Positive for dyspnea on exertion.   Respiratory: Negative.     Endocrine: Negative.    Hematologic/Lymphatic: Negative.    Skin: Negative.    Musculoskeletal: Negative.    Gastrointestinal: Negative.    Genitourinary: Negative.    Neurological: Negative.    Psychiatric/Behavioral: Negative.     Allergic/Immunologic: Negative.        Physical Exam  GENERAL: The patient is well developed, well nourished, resting comfortably and in no distress.   HEENT: No abnormalities of the visible oro-nasopharynx, conjunctiva or sclera are noted.  NECK: There is no jugular venous distension. Carotids are palpable and without bruits. There is no thyroid enlargement.  Right carotid artery surgery incision looks good.  Chest: Lung fields are clear to auscultation. There are no wheezes or crackles.  CV: There is a regular rhythm. The first and second heart sounds are normal. There are no murmurs, gallops or rubs.  ABD: The abdomen is soft and supple with normal bowel sounds. There is no hepatosplenomegaly, ascites, tenderness, masses or bruits.  Neuro: There are no focal motor defects. Ambulation is normal. Cognitive function appears normal.  Ext: There is trace bipedal and pretibial edema without evidence of deep vein thrombosis. Peripheral pulses are satisfactory.    SKIN: There are no rashes and no cellulitis.  The cellulitis on his left anterior tibial surface has healed.  PSYCH: The patient is calm, rationale and oriented.    Cardiovascular Studies  A twelve-lead ECG obtained on September 26, 2023 shows AV dual pacing with a heart rate of 67 bpm.  Echo Doppler (following TAVR) 07/18/2023:  Echocardiographic Findings    Left Ventricle The left ventricular size is normal. The left ventricular wall thickness is normal. The left ventricular systolic function is mildly reduced. EF 50 +/-5%. There are segmental wall motion abnormalities, as described below. Grade II (moderate) left ventricular diastolic dysfunction. Elevated left atrial pressure.   Right Ventricle The right ventricular size is normal. The right ventricular systolic function is mildly reduced. Pacemaker lead present in the ventricle.   Left Atrium Moderately dilated. There is probably residual atrial septal defect with color Doppler not well-visualized   Right Atrium Normal size. Pacemaker lead present in the right atrium.   IVC/SVC Normal central venous pressure (0-5 mm Hg).   Mitral Valve Non-specific thickening. There is mitral annular calcification.   Tricuspid Valve Normal valve structure. No stenosis. Trace regurgitation.   Aortic Valve There is a 26 mm Sapien S3 Resilia bioprosthetic valve present. Mean gradient of 8 mmHg. Mild paravalvular leak   Pulmonary The pulmonic valve was not seen well but no Doppler evidence of stenosis.   Aorta The aortic root and ascending aorta are normal in size.   Pericardium No pericardial effusion.           Left Ventricular Wall Scoring    Resting Score Index: 1.59              The following segments are akinetic: basal inferoseptal, basal inferior and basal inferolateral.  The following segments are hypokinetic: mid inferoseptal, mid inferolateral, apical septal and apex.  All other segments are normal.           Cardiovascular Health Factors  Vitals BP Readings from Last 3 Encounters:   11/29/23 135/72   11/16/23 131/50   10/05/23 (!) 160/71     Wt Readings from  Last 3 Encounters:   11/29/23 97.1 kg (214 lb)   11/16/23 97.3 kg (214 lb 8.1 oz)   10/05/23 94.3 kg (208 lb)     BMI Readings from Last 3 Encounters:   11/29/23 29.85 kg/m?   11/16/23 29.09 kg/m?   10/05/23 28.21 kg/m?      Smoking Social History     Tobacco Use   Smoking Status Former    Current packs/day: 0.00    Average packs/day: 1 pack/day for 40.0 years (40.0 ttl pk-yrs)    Types: Cigarettes    Start date: 06/02/1964    Quit date: 06/02/2004    Years since quitting: 19.5   Smokeless Tobacco Never      Lipid Profile Cholesterol   Date Value Ref Range Status   03/27/2023 71 <200 MG/DL Final     HDL   Date Value Ref Range Status   03/27/2023 42 >40 MG/DL Final     LDL   Date Value Ref Range Status   03/27/2023 21 <100 mg/dL Final     Triglycerides   Date Value Ref Range Status   03/27/2023 124 <150 MG/DL Final      Blood Sugar Hemoglobin A1C   Date Value Ref Range Status   03/26/2023 6.7 (H) 4.0 - 5.7 % Final     Comment:     The ADA recommends that most patients with type 1 and type 2 diabetes maintain   an A1c level <7%.       Glucose   Date Value Ref Range Status   11/16/2023 110 (H) 70 - 100 mg/dL Final   16/04/9603 540 (H) 70 - 100 mg/dL Final   98/05/9146 829 (H) 70 - 100 mg/dL Final     Glucose, POC   Date Value Ref Range Status   11/16/2023 127 (H) 70 - 100 mg/dL Final   56/21/3086 578 (H) 70 - 100 mg/dL Final   46/96/2952 841 (H) 70 - 100 mg/dL Final          Problems Addressed Today  Coronary artery disease.  Carotid disease.  Aortic valve stenosis.  Sinus bradycardia.    Assessment and Plan     Mr. Schnitzler currently appears to be doing well.  He reports no angina or congestive symptoms.  He has had transcatheter aortic valve replacement and right carotid endarterectomy within the past year.  His blood pressure and LDL cholesterol appear well-controlled.  He is back to swimming a half an hour 6 days a week.  Cardiovascular risk factor modification was reviewed in detail.  I have asked him to return for follow-up in 6 months time. The total time spent during this interview and exam with preparation and chart review was 30 minutes. Current Medications (including today's revisions)   acetaminophen (TYLENOL) 325 mg tablet Take two tablets by mouth every 4 hours as needed. TOTAL ACETAMINOPHEN DOSE NOT TO EXCEED 4GM DAILY  Indications: pain    albuterol sulfate (PROAIR HFA) 90 mcg/actuation HFA aerosol inhaler Inhale one puff by mouth into the lungs every 6 hours as needed.    amoxicillin-potassium clavulanate (AUGMENTIN) 875/125 mg tablet Take one tablet by mouth every 12 hours.    aspirin EC (ASPIR-LOW) 81 mg tablet Take one tablet by mouth daily. Indications: s/p Watchman    bisoprolol fumarate (ZEBETA) 5 mg tablet Take one tablet by mouth twice daily.    calcium carbonate (TUMS) 500 mg (200 mg elemental calcium) chewable tablet Chew one tablet by mouth daily as needed.  CHOLEcalciferoL (vitamin D3) (VITAMIN D3) 50 mcg (2,000 unit) tablet Take one tablet by mouth every 48 hours.    coenzyme Q10 100 mg cap Take one capsule by mouth three times weekly. Monday, Wednesday and Friday.    Cranberry 500 mg cap Take one capsule by mouth daily.    cyanocobalamin (vitamin B-12) 1,000 mcg tablet Take one tablet by mouth every 48 hours.    evolocumab (REPATHA SURECLICK) 140 mg/mL injectable PEN Inject 1 mL under the skin every 14 days.    ezetimibe (ZETIA) 10 mg tablet TAKE 1 TABLET EVERY DAY    ferrous sulfate (FEOSOL) 325 mg (65 mg iron) tablet Take one tablet by mouth three times weekly. Take on an empty stomach at least 1 hour before or 2 hours after food. Takes on Monday, Wednesday and Friday    furosemide (LASIX) 40 mg tablet Take 1/2-1 tablet by mouth daily as directed for swelling and weight gain.    glucosamine/chondro su A/C/Mn (GLUCOSAMINE-CHONDROITIN COMPLX PO) Take 1 tablet by mouth daily.    HYDROcodone/acetaminophen (NORCO) 5/325 mg tablet Take 1.5 tablets by mouth every 4 hours as needed. Indications: acute postoperative pain    levothyroxine (SYNTHROID) 175 mcg tablet Take one tablet by mouth daily 30 minutes before breakfast. losartan (COZAAR) 100 mg tablet TAKE 1 TABLET EVERY DAY    magnesium glycinate 100 mg magnesium capsule Take one capsule by mouth every 48 hours.    metFORMIN (GLUCOPHAGE) 500 mg tablet Take one tablet by mouth twice daily with meals. Ok to resume 06/06/23  Indications: type 2 diabetes mellitus    naloxone (NARCAN) 4 mg/actuation nasal spray Insert 1 spray into 1 nostril as needed for signs of opioid overdose then call 911. May repeat dose every 2-3 minutes (alternate nostrils) until medical team arrives.  Indications: decrease in rate & depth of breathing due to opioid drug    omeprazole DR(+) (PRILOSEC) 20 mg PO capsule Take one capsule by mouth daily.    phenoL (CHLORASEPTIC) 1.4 % mouth spray Take two sprays by mouth or throat as directed as Needed. Indications: throat irritation    polyethylene glycol 3350 (MIRALAX) 17 g packet Take one packet by mouth daily as needed. Indications: constipation    potassium chloride SR (KLOR-CON M20) 20 mEq tablet Take one tablet by mouth daily. Take while taking furosemide.  Indications: prevention of low potassium in the blood    rosuvastatin (CRESTOR) 20 mg tablet TAKE 1 TABLET EVERY DAY    vitamins, multiple tablet Take one tablet by mouth every 48 hours.

## 2023-11-29 NOTE — Progress Notes
 Order for CTA abd/pelvis faxed to [facility] in [place], [place] per the patient's request (FAX [phone]) - [facility]#[id number] 10/09/23-01/08/24

## 2023-12-06 ENCOUNTER — Encounter: Admit: 2023-12-06 | Discharge: 2023-12-06 | Payer: MEDICARE

## 2024-01-10 ENCOUNTER — Encounter: Admit: 2024-01-10 | Discharge: 2024-01-10 | Payer: MEDICARE

## 2024-01-10 DIAGNOSIS — I6521 Occlusion and stenosis of right carotid artery: Principal | ICD-10-CM

## 2024-01-10 NOTE — Telephone Encounter
 Andree from [facility] stating that the contrast information is missing from orders for a CT abd/pelvis scan that was just faxed to them,  Also, ICD 10 code missing.  Return fax number is [phone].  Andree gave her return number at [phone] but recommended calling their CT department with any questions at [phone] if more information is needed.

## 2024-01-11 ENCOUNTER — Encounter: Admit: 2024-01-11 | Discharge: 2024-01-11 | Payer: MEDICARE

## 2024-01-11 ENCOUNTER — Ambulatory Visit: Admit: 2024-01-11 | Discharge: 2024-01-11 | Payer: MEDICARE

## 2024-01-11 DIAGNOSIS — E7800 Pure hypercholesterolemia, unspecified: Secondary | ICD-10-CM

## 2024-01-11 DIAGNOSIS — I6523 Occlusion and stenosis of bilateral carotid arteries: Principal | ICD-10-CM

## 2024-01-11 DIAGNOSIS — I10 Essential (primary) hypertension: Secondary | ICD-10-CM

## 2024-01-11 DIAGNOSIS — Z9889 Other specified postprocedural states: Secondary | ICD-10-CM

## 2024-01-11 DIAGNOSIS — I6521 Occlusion and stenosis of right carotid artery: Secondary | ICD-10-CM

## 2024-01-11 NOTE — Progress Notes
 Date of Service: 01/11/2024              Chief Complaint   Patient presents with    Post Operative Visit     S/p R CEA        History of Present Illness    This is a 78 year old gentleman with a history of hypertension, hyperlipidemia, coronary artery disease and carotid stenosis.  He is status post right carotid endarterectomy by Dr. Durwin on 11/14/2023.  He is back today for a postoperative visit.  He reports that during and after surgery, he had quite a bit of right neck and right headache pain.  He also had a sore throat tooth/jaw and earache pain and developed bronchitis after discharge from surgery.  He was treated with antibiotics and he reports all of this is now better.  He says he will never go through that again!  He denies current chest pain and shortness of breath.  He denies fever and chills.  He denies any lateralizing TIA or strokelike symptoms.    We were able to obtain a limited right carotid surveillance ultrasound today which shows less than 50% stenosis of the right internal carotid artery with a ratio of 1.6 and normal forward flow in his right vertebral artery.  Preoperatively, he had a 70 to 99% right internal carotid stenosis and less than 50% left internal carotid stenosis.    He has a history of coronary artery bypass grafting and TAVR.    His most recent hemoglobin A1c from September 2024 was 6.7.  His most recent creatinine was 0.70 with a GFR greater than 60.  He does not smoke.    He is on aspirin , rosuvastatin  and Zetia  daily and takes Repatha  as directed.      Past Medical History:    Abnormal stress test    Atrial fibrillation (CMS-HCC)    Cardiac pacemaker    Chest pain    Cigarette smoker    COPD (chronic obstructive pulmonary disease) (CMS-HCC)    Coronary artery disease    Former cigarette smoker    GERD (gastroesophageal reflux disease)    Hypercholesterolemia    Hypertension    Hypothyroidism    Presence of Watchman left atrial appendage closure device    Tests ordered Undiagnosed cardiac murmurs    Valvular heart disease       Surgical History:   Procedure Laterality Date    HX CORONARY ARTERY BYPASS GRAFT  2005    x3 vessels    HEART CATHETERIZATION  2005    HEART CATHETERIZATION  2012    HEART CATHETERIZATION  2019    ANGIOGRAPHY CORONARY ARTERY WITH RIGHT AND LEFT HEART CATHETERIZATION N/A 06/18/2018    Performed by Freeman Donnice NOVAK, MD, FACC at [facility] CATH LAB    POSSIBLE PERCUTANEOUS CORONARY STENT PLACEMENT WITH ANGIOPLASTY N/A 06/18/2018    Performed by Freeman Donnice NOVAK, MD, FACC at [facility] CATH LAB    ABLATION OF DYSRHYTHMIC FOCUS  01/21/2021    PACEMAKER INSERTION  01/21/2021    INSERTION/ REPLACEMENT PERMANENT PACEMAKER WITH ATRIAL AND VENTRICULAR LEAD Left 01/21/2021    Performed by Ramirez, Rigoberto, MD at [facility] EP LAB    INTRACARDIAC CATHETER ABLATION WITH COMPREHENSIVE ELECTROPHYSIOLOGIC EVALUATION - TYPICAL FLUTTER  01/21/2021    Performed by Liborio Countryman, MD at [facility] EP LAB    PERCUTANEOUS CLOSURE LEFT ATRIAL APPENDAGE WITH ENDOCARDIAL IMPLANT, TRANSEPTAL CATHETERIZATION, FOLEY, CONTRAST ANTICIPATED N/A 02/20/2022    Performed by Cathlean Kipper, MD at [facility]  EP LAB    INTRACARDIAC ECHOCARDIOGRAPHY  02/20/2022    Performed by Cathlean Kipper, MD at [facility] EP LAB    PERCUTANEOUS CORONARY STENT PLACEMENT WITH ANGIOPLASTY N/A 06/22/2022    Performed by Erling Camellia RAMAN, MD at [facility] CATH LAB    ANGIOGRAPHY CORONARY ARTERY AND ARTERIAL/VENOUS GRAFTS WITH LEFT HEART CATHETERIZATION N/A 06/22/2022    Performed by Erling Camellia RAMAN, MD at [facility] CATH LAB    ANGIOGRAPHY CORONARY ARTERY WITH LEFT HEART CATHETERIZATION N/A 03/27/2023    Performed by Hajj, Inetta SQUIBB, MD at [facility] CATH LAB    PERCUTANEOUS CORONARY STENT PLACEMENT WITH ANGIOPLASTY N/A 03/27/2023    Performed by Hajj, Inetta SQUIBB, MD at [facility] CATH LAB    AORTIC VALVE REPLACEMENT  06/04/2023    [place]    Transcatheter Aortic Valve Replacement - Axillary Artery N/A 06/04/2023    Performed by Tony Cordella FALCON, MD at [facility] EP LAB    RIGHT CAROTID ENDARTERECTOMY Right 11/14/2023    Performed by Durwin Elsie BRAVO, MD at [facility] OR    CARDIAC CATHERIZATION      CARDIOVASCULAR STRESS TEST      ELECTROCARDIOGRAM      LEFT ATRIAL APPENDAGE CLOSURE      TRANSESOPHAGEAL ECHO         Allergies:  Allergies   Allergen Reactions    Cefdinir NAUSEA ONLY    Oxycodone  NAUSEA AND VOMITING    Percodan [Oxycodone  Hcl-Oxycodone -Asa] NAUSEA AND VOMITING     Tolerates other pain medications well - Oxycodone  is the only pain med he has this issue with.       Medication List:   acetaminophen  (TYLENOL ) 325 mg tablet Take two tablets by mouth every 4 hours as needed. TOTAL ACETAMINOPHEN  DOSE NOT TO EXCEED 4GM DAILY  Indications: pain    albuterol  sulfate (PROAIR  HFA) 90 mcg/actuation HFA aerosol inhaler Inhale one puff by mouth into the lungs every 6 hours as needed.    amLODIPine  (NORVASC ) 5 mg tablet     amoxicillin-potassium clavulanate (AUGMENTIN) 875/125 mg tablet Take one tablet by mouth every 12 hours.    aspirin  EC (ASPIR-LOW) 81 mg tablet Take one tablet by mouth daily. Indications: s/p Watchman    bisoprolol  fumarate (ZEBETA ) 5 mg tablet Take one tablet by mouth twice daily.    calcium carbonate (TUMS) 500 mg (200 mg elemental calcium) chewable tablet Chew one tablet by mouth daily as needed.    CHOLEcalciferoL (vitamin D3) (VITAMIN D3) 50 mcg (2,000 unit) tablet Take one tablet by mouth every 48 hours.    coenzyme Q10 100 mg cap Take one capsule by mouth three times weekly. Monday, Wednesday and Friday.    Cranberry 500 mg cap Take one capsule by mouth daily.    cyanocobalamin  (vitamin B-12) 1,000 mcg tablet Take one tablet by mouth every 48 hours.    evolocumab  (REPATHA  SURECLICK) 140 mg/mL injectable PEN Inject 1 mL under the skin every 14 days.    ezetimibe  (ZETIA ) 10 mg tablet TAKE 1 TABLET EVERY DAY    ferrous sulfate  (FEOSOL) 325 mg (65 mg iron ) tablet Take one tablet by mouth three times weekly. Take on an empty stomach at least 1 hour before or 2 hours after food. Takes on Monday, Wednesday and Friday    furosemide  (LASIX ) 40 mg tablet Take 1/2-1 tablet by mouth daily as directed for swelling and weight gain.    glucosamine/chondro su A/C/Mn (GLUCOSAMINE-CHONDROITIN COMPLX PO) Take 1 tablet by mouth daily.    HYDROcodone /acetaminophen  (NORCO)  5/325 mg tablet Take 1.5 tablets by mouth every 4 hours as needed. Indications: acute postoperative pain    levothyroxine  (SYNTHROID ) 175 mcg tablet Take one tablet by mouth daily 30 minutes before breakfast.    losartan  (COZAAR ) 100 mg tablet TAKE 1 TABLET EVERY DAY    magnesium  glycinate 100 mg magnesium  capsule Take one capsule by mouth every 48 hours.    metFORMIN  (GLUCOPHAGE ) 500 mg tablet Take one tablet by mouth twice daily with meals. Ok to resume 06/06/23  Indications: type 2 diabetes mellitus    naloxone  (NARCAN ) 4 mg/actuation nasal spray Insert 1 spray into 1 nostril as needed for signs of opioid overdose then call 911. May repeat dose every 2-3 minutes (alternate nostrils) until medical team arrives.  Indications: decrease in rate & depth of breathing due to opioid drug    omeprazole DR(+) (PRILOSEC) 20 mg PO capsule Take one capsule by mouth daily.    phenoL (CHLORASEPTIC) 1.4 % mouth spray Take two sprays by mouth or throat as directed as Needed. Indications: throat irritation    polyethylene glycol 3350  (MIRALAX ) 17 g packet Take one packet by mouth daily as needed. Indications: constipation    potassium chloride  SR (KLOR-CON  M20) 20 mEq tablet Take one tablet by mouth daily. Take while taking furosemide .  Indications: prevention of low potassium in the blood    rosuvastatin  (CRESTOR ) 20 mg tablet TAKE 1 TABLET EVERY DAY    vitamins, multiple tablet Take one tablet by mouth every 48 hours.       Social History:   reports that he quit smoking about 19 years ago. His smoking use included cigarettes. He started smoking about 59 years ago. He has a 40 pack-year smoking history. He has never used smokeless tobacco. He reports current alcohol use of about 9.0 - 10.0 standard drinks of alcohol per week. He reports that he does not use drugs.    Family History   Problem Relation Name Age of Onset    Hypertension Mother Hardin        Review of Systems            Vitals:    01/11/24 1334 01/11/24 1340   BP: 116/69 113/59   BP Source: Arm, Left Upper Arm, Right Upper   Pulse: 62 61   Temp: 36.3 ?C (97.3 ?F) (!) 16.1 ?C (61 ?F)   TempSrc: Temporal    PainSc: Zero    Weight: 93.9 kg (207 lb)    Height: 182.9 cm (6')      Body mass index is 28.07 kg/m?SABRA     Physical Exam  Vitals and nursing note reviewed.   Constitutional:       General: He is not in acute distress.     Appearance: Normal appearance. He is well-developed, well-groomed and normal weight. He is not ill-appearing or diaphoretic.   HENT:      Head: Normocephalic.   Eyes:      General:         Right eye: No discharge.         Left eye: No discharge.   Neck:      Trachea: Phonation normal.      Comments: His right neck incision is approximated, without significant redness, edema or drainage from the surgical site.  Pulmonary:      Effort: Pulmonary effort is normal. No respiratory distress.   Musculoskeletal:      Cervical back: Normal range of motion.      Right  foot: Normal range of motion. No deformity.      Left foot: Normal range of motion. No deformity.   Feet:      Right foot:      Skin integrity: No ulcer, blister, skin breakdown or erythema.      Left foot:      Skin integrity: No ulcer, blister, skin breakdown or erythema.   Skin:     General: Skin is warm and dry.      Capillary Refill: Capillary refill takes less than 2 seconds.      Coloration: Skin is not cyanotic or mottled.   Neurological:      General: No focal deficit present.      Mental Status: He is alert and oriented to person, place, and time.      Motor: Motor function is intact.      Coordination: Coordination is intact.      Gait: Gait is intact.      Comments: Grip strength is strong and equal bilaterally. Smile is symmetrical.  Tongue protrudes midline.  There is no arm drift.   Psychiatric:         Attention and Perception: Attention normal.         Mood and Affect: Mood normal.         Speech: Speech normal.         Behavior: Behavior normal. Behavior is cooperative.         Thought Content: Thought content normal.             Assessment and Plan:    1. Bilateral carotid artery stenosis        2. History of right-sided carotid endarterectomy-Dr. Durwin, 11/14/2023        3. Primary hypertension        4. Hypercholesterolemia        5. Asymptomatic stenosis of right carotid artery  VAS US  DUPLEX SCAN CAROTID BILATERAL        His right carotid ultrasound showed less than 50% stenosis with a ratio of 1.6 after right carotid endarterectomy. Although he reports quite a bit of right neck and headache pain along with a sore throat, earache and teeth hurting associated with surgery and after, this has all resolved and he is doing much better now.  However, he reports he will never go through this surgery ever again.  He denies any lateralizing TIA or strokelike symptoms.  I discussed with him our recommendations.    He should continue his aspirin , rosuvastatin  and Zetia  daily along with his Repatha  as directed.    We will follow-up again in 1 year with a bilateral carotid surveillance ultrasound and office visit at that time.    It sounds like he is being worked up for bilateral lower extremity arterial disease and has a CT scan pending.    We gave him information on carotid disease and endarterectomy for his visit summary.  He had no additional questions or concerns for me today.    Karna CHRISTELLA Cancer, APRN-NP    >  26     Minutes spent on chart review, physical exam and documentation.

## 2024-01-23 ENCOUNTER — Encounter: Admit: 2024-01-23 | Discharge: 2024-01-23 | Payer: MEDICARE

## 2024-01-23 DIAGNOSIS — I350 Nonrheumatic aortic (valve) stenosis: Principal | ICD-10-CM

## 2024-01-23 MED ORDER — EZETIMIBE 10 MG PO TAB
10 mg | ORAL_TABLET | Freq: Every day | ORAL | 1 refills | 90.00000 days | Status: AC
Start: 2024-01-23 — End: ?

## 2024-01-29 ENCOUNTER — Encounter: Admit: 2024-01-29 | Discharge: 2024-01-29 | Payer: MEDICARE

## 2024-02-04 ENCOUNTER — Encounter: Admit: 2024-02-04 | Discharge: 2024-02-04 | Payer: MEDICARE

## 2024-02-04 DIAGNOSIS — I70213 Atherosclerosis of native arteries of extremities with intermittent claudication, bilateral legs: Principal | ICD-10-CM

## 2024-02-08 ENCOUNTER — Encounter: Admit: 2024-02-08 | Discharge: 2024-02-08 | Payer: MEDICARE

## 2024-02-15 ENCOUNTER — Encounter: Admit: 2024-02-15 | Discharge: 2024-02-15 | Payer: MEDICARE

## 2024-02-15 ENCOUNTER — Ambulatory Visit: Admit: 2024-02-15 | Discharge: 2024-02-16 | Payer: MEDICARE

## 2024-02-18 ENCOUNTER — Encounter: Admit: 2024-02-18 | Discharge: 2024-02-18 | Payer: MEDICARE

## 2024-02-19 MED FILL — REPATHA SURECLICK 140 MG/ML SC PNIJ: 140 mg/mL | SUBCUTANEOUS | 84 days supply | Qty: 6 | Fill #1 | Status: AC

## 2024-03-10 ENCOUNTER — Ambulatory Visit: Admit: 2024-03-10 | Discharge: 2024-03-09 | Payer: MEDICARE

## 2024-03-10 ENCOUNTER — Encounter: Admit: 2024-03-10 | Discharge: 2024-03-10 | Payer: MEDICARE

## 2024-03-10 NOTE — Telephone Encounter
 Flag sent to [facility] team to schedule follow up.

## 2024-03-10 NOTE — Telephone Encounter
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  He has had recent coronary angiography and I am not sure that any urgent intervention is required but please schedule him to see cardiac EP to review.  Thanks.  SBG

## 2024-04-02 ENCOUNTER — Encounter: Admit: 2024-04-02 | Discharge: 2024-04-02 | Payer: MEDICARE

## 2024-04-02 NOTE — Telephone Encounter
 Patient scheduled with PW on 04/28/24. Patient confirmed D-T-L of upcoming appointment.

## 2024-04-02 NOTE — Telephone Encounter
-----   Message from GORMAN Alexander, MD sent at 04/02/2024 11:58 AM CDT -----  Amit: Thanks for following Mr. Leverette with me.  You saw him on 09/26/2023 for nonsustained ventricular tachycardia and increased his bisoprolol .  Please review his pacemaker interrogation from 04/02/2024 when possible.  He is still having some nonsustained ventricular tachycardia.  Any additional action recommended?  Larraine and Marcey: Dr. Cathlean had wanted to see Mr.Cumber back in clinic 6 months after his March visit.  Can you please schedule him when possible.  Thanks to all. SBG

## 2024-04-13 ENCOUNTER — Encounter: Admit: 2024-04-13 | Discharge: 2024-04-13 | Payer: MEDICARE

## 2024-04-21 ENCOUNTER — Encounter: Admit: 2024-04-21 | Discharge: 2024-04-21 | Payer: MEDICARE

## 2024-04-21 DIAGNOSIS — Z950 Presence of cardiac pacemaker: Secondary | ICD-10-CM

## 2024-04-21 DIAGNOSIS — I483 Typical atrial flutter: Principal | ICD-10-CM

## 2024-04-21 NOTE — Telephone Encounter
 Pt is scheduled to see Deward, APRN next week (10/27). Will send EP scheduling team a msg to set pt up to see ANO himself when he is next available in [place]. I suspect this will be a few months out.

## 2024-04-21 NOTE — Telephone Encounter
-----   Message from DELENA Bear, MD sent at 04/21/2024  2:23 PM CDT -----  Dear Dr. Debroah: His nonsustained VT now is worse than what it was before.  He is on maximum bisoprolol  dose.  Not sure there is much else to do for now other than continuing to monitor.  If this continues maybe I can bring him to the EP lab for an EP study as he may have had inferior/inferolateral infarct.  He has pretty wide QRS and if his EF is getting worse would do CRT upgrade.    EP nurses, can you set up office visit with me to review in more detail.  ----- Message -----  From: Debroah Elspeth NOVAK, MD  Sent: 04/02/2024  12:02 PM CDT  To: Derick Bear, MD; Larraine Mackintosh, RN; Stephe#    Amit: Thanks for following Mr. Stogsdill with me.  You saw him on 09/26/2023 for nonsustained ventricular tachycardia and increased his bisoprolol .  Please review his pacemaker interrogation from 04/02/2024 when possible.  He is still having some nonsustained ventricular tachycardia.  Any additional action recommended?  Larraine and Marcey: Dr. Bear had wanted to see Mr.Leikam back in clinic 6 months after his March visit.  Can you please schedule him when possible.  Thanks to all. SBG

## 2024-04-21 NOTE — Telephone Encounter
 RE: [place] OV needed  Received: Today  Rashid-Bey, Mark David Rav, RN; P [facility] Scheduling  Done LVM          Previous Messages       ----- Message -----  From: David Rav, RN  Sent: 04/21/2024   2:30 PM CDT  To: [facility] Scheduling  Subject: [place] OV needed                                Patient needs OV with ANO in [place] in next available. Not urgent just next open slot    Thanks!  Rav, RN

## 2024-04-23 ENCOUNTER — Encounter: Admit: 2024-04-23 | Discharge: 2024-04-23 | Payer: MEDICARE

## 2024-04-28 ENCOUNTER — Encounter: Admit: 2024-04-28 | Discharge: 2024-04-28 | Payer: MEDICARE

## 2024-04-28 ENCOUNTER — Ambulatory Visit: Admit: 2024-04-28 | Discharge: 2024-04-28 | Payer: MEDICARE

## 2024-04-28 VITALS — BP 172/81 | HR 75 | Ht 71.0 in | Wt 212.4 lb

## 2024-04-28 DIAGNOSIS — Z95818 Presence of other cardiac implants and grafts: Secondary | ICD-10-CM

## 2024-04-28 DIAGNOSIS — Z136 Encounter for screening for cardiovascular disorders: Principal | ICD-10-CM

## 2024-04-28 DIAGNOSIS — I10 Essential (primary) hypertension: Secondary | ICD-10-CM

## 2024-04-28 DIAGNOSIS — I5032 Chronic diastolic (congestive) heart failure: Secondary | ICD-10-CM

## 2024-04-28 DIAGNOSIS — Z950 Presence of cardiac pacemaker: Secondary | ICD-10-CM

## 2024-04-28 DIAGNOSIS — I480 Paroxysmal atrial fibrillation: Secondary | ICD-10-CM

## 2024-04-28 NOTE — Progress Notes [1]
 Date of Service: 04/28/2024    Mason Lawson is a 78 y.o. male.       HPI     Mason Lawson was seen in the office today in electrophysiology follow-up. As you may know, he is a 78 y.o. 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  He follows with Dr. Cathlean and Dr. Debroah.     He presents to clinic today in 30-month follow-up.    Most recent echocardiogram is from January 2025 showed an LVEF of 50%,  with moderately dilated LA noted.    He was last seen in EP clinic in March 2025 by Dr. Cathlean.  He had had episodes of NSVT with heart rates up to 160 bpm lasting for 3 seconds or less.  Bisoprolol  was increased to 5 mg twice daily.  Amlodipine  was discontinued (with consideration of adding back if BP remained elevated).  Pacemaker was functioning appropriately.    He was admitted to [facility] in May 2025 and underwent right carotid endarterectomy with vascular surgery.  He did have postoperative hypotension and was transferred to SICU.  He was briefly treated with pressors but progressed as expected and was discharged a couple of days later.    Most recent remote from 04/02/2024.  Device programmed DDDR 60/130.  Mode switch 150 bpm.  AP 98% with VP 99.5%.  1 episode of NSVT 3 seconds in duration at 184 bpm (9/30).  Underlying complete heart block noted previously.  2 AF/AFL episodes noted with an overall burden of 1.6%.  Longest episode lasted more than 10 hours with ventricular rates well-controlled.  Prior remote from 02/29/2024 showed 6 NSVT episodes ranging from 3 to 6 seconds.          Today in clinic he reports overall doing well.  He is very physically active at baseline.  He swims 20 minutes daily for exercise.  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  They went to [place]  for a week and did a lot of walking there.  He reported one dizzy episode on 9/1 where he was near syncopal.  That was an isolated event.  He denies any chest pain since his TAVR.  He denies any shortness of breath.  His legs will get weak at times and he tells me that some vascular surgery has been recommended but declined.  His edema is well-controlled.  Spironolactone  is on his medication list but he tells me he has not taken this for some time but does take 20 mg of Lasix  daily.             Vitals:    04/28/24 0748   BP: (!) 172/81   BP Source: Arm, Left Upper   Pulse: 75   SpO2: 93%   O2 Device: None (Room air)   PainSc: Zero   Weight: 96.3 kg (212 lb 6.4 oz)   Height: 180.3 cm (5' 11)     Body mass index is 29.62 kg/m?SABRA     Past Medical History  Patient Active Problem List    Diagnosis Date Noted    Carotid stenosis, right 11/14/2023  Right-sided extracranial carotid artery stenosis 11/13/2023    PAD (peripheral artery disease) 11/13/2023    Stage 1 mild COPD by GOLD classification (CMS-HCC) 06/04/2023    S/p TAVR (transcatheter aortic valve replacement), bioprosthetic 06/04/2023     06/04/23: Dr. Tony and Dr. Leva       Bilateral carotid artery stenosis 05/30/2023    Atherosclerosis of aorta 05/29/2023    Former smoker 05/29/2023    Aortic valve disease 03/26/2023    Acute and chronic respiratory failure with hypoxia (CMS-HCC) 12/05/2022    Coronary artery disease due to lipid rich plaque 06/22/2022    Presence of Watchman left atrial appendage closure device 02/21/2022     02/20/2022 - Dr. Cathlean; Left atrial appendage occlusion with 31 mm Watchman FLX device      PAF (paroxysmal atrial fibrillation) (CMS-HCC) 02/20/2022    Cardiac pacemaker 05/16/2021 01/21/2021 - Successful Dual-chamber Pacemaker Implantation with Dr. Rubin      Atrial fibrillation (CMS-HCC) 05/13/2021    Bradycardia 02/01/2021    DM (diabetes mellitus) type II, controlled, with peripheral vascular disorder (CMS-HCC) 02/01/2021    Paroxysmal nocturnal dyspnea 02/01/2021    Atrial flutter (CMS-HCC) 01/19/2021     01/21/2021 - AFL RFA with Dr. Liborio:  Successful cavo-tricuspid isthmus ablation.  Comprehensive EP study with coronary sinus catheter placement.  Underlying severe sinus node dysfunction. No escape rhythm.      Chronic diastolic heart failure, NYHA class 2 (CMS-HCC) 12/04/2018     RIGHT HEART CATHETERIZATION:  06/2018 in the setting of severe hypertension  HEMODYNAMICS:   Blood pressure 198/81 with a mean of 130 mmHg. Heart rate 50 beats per minute.   PRESSURES:  The right atrial pressure was 4 mmHg. Right ventricular pressure was 64/8 mmHg.  The pulmonary artery pressure was 60/70 mmHg with a mean of 32 mmHg. The pulmonary capillary wedge pressure was 17 mmHg. The transpulmonary gradient was 15 mmHg.  The diastolic pulmonary gradient was 0 mmHg. The pulmonary vascular resistance was 2 Wood units.   RIGHT SATURATIONS: The right atrial saturation 77%. The pulmonary artery saturation was 75%. The aortic saturation was 96%.   OUTPUTS: The cardiac output by Fick method was 5.8 L/minute with a cardiac index of 2.7 L/minute per sq m. The cardiac index by thermodilution was 7.3 with a cardiac index of 3.4 L/minute per sq m.      Dyslipidemia 10/23/2012    Chest pain 09/26/2010    Abnormal stress test 09/26/2010    Murmur 09/23/2010    Coronary artery disease      A. Onset mid 2004, progression fall 2005, patient reduces activity at work to avoid symptoms  B. 12/05 Progressive angina, Dr. Naomia Braver, then 06/29/04 CBP Atch OV, referred for cath          >12/29 Cath 100%RCA, 75%LAD&Cx,50%LM EF 55%          >07/01/05 CABGx3 LIMA-LAD w/ Y-RIMA-OM, Rad-PDA per Dr. Tony  C. 06/29/08 exercise echocardiographic images demonstrated basal septal ischemia, significant hypertensive response to exercise, good and adequate exercise tolerance with no angina.  D. LHC 06/2018 for new heart failure symptoms: LEFT HEART CATHETERIZATION:  HEMODYNAMICS:  The aortic pressure was- 195/63 with a mean of 105 mmHg .  The left ventricular systolic pressure was 210 mmHg.  There was a peak to peak gradient of 15 mmHg.  Left main coronary artery:  Left main coronary artery arises normally from the left coronary sinus.  The left main coronary artery bifurcates into left anterior  descending and left circumflex artery and is free of angiographically significant disease.  Left anterior descending:  Left anterior descending is a large vessel which gives rise to- 3 diagonal vessels. 1 diagonal has a 80% ostial stenosis, the rest of diagonal vessels do not appear to be angiographically significant. Distal LAD has competitive flow from the LIMA.   Left circumflex artery:  The left circumflex artery appears to be medium caliber and has a 100% stenosis proximally. The mid-to-distal portion of the vessel fills from bridging collaterals from proximal portion and is diffusely disease. Three obtuse marginal branches arise, which are also diffusely diseased.   Right coronary artery:  The right coronary artery is a medium caliber vessel, but has 100% chronic total occlusion after the takeoff of conus branch. The PDA fills retrogradely from the radial graft to supply the PLV and has mild plaquing in the native PDA.    BYPASS GRAFT ANATOMY:  1.The LIMA to LAD was visualized using IM catheter.  The ostium, body was noted to be free of any angiographic disease, but has a 80% stenosis at the anastamosis with diffusely diseased distal LAD. There is a Y jump graft from the LIMA to the OM branch which fills retrogradely.            2. The radial artery graft to the PDA appears to be without any angiographically significant disease at ostium, body or anastomotic site.   E. Regadenosom MPI 11/2018 TID Ratio:  1.05  (normal <1.36). Summed Stress Score:  3   , Summed Rest Score:  0 Quantitative polar maps show a statistically insignificant amount of inferoapical ischemia. Mild inferobasilar septal hypokinesis.       Left Ventricular Ejection Fraction (post stress, in the resting state) =  52 %.  Left Ventricular End Diastolic Volume: 824 mL       SUMMARY/OPINION:   This study is borderline abnormal with limited reversible uptake in the inferior wall.  All segments are viable.  Global left ventricular function is within normal limits. In aggregate the current study is low risk in regards to predicted annual cardiovascular mortality rate.         Hypercholesterolemia      A.  02/28/01 total 324 total 249 HDL 43 LDL 231 Pt deferred meds  B. 06/29/04 total 273 trig 193 HDL 36 LDLd206 Zocor begun dc'd by pt d/t statinphobia and cost  C.  2/24 Rx Vytorin 10/80 1/2 tab.      Hypothyroidism      A.  2004 presentation with TSH over 200, Rx w l-thyroxine      Hypertension      A.  2/06 Altace 5 dc'd w/ dysgeusia, Metoprolol  25 BID begun         Tests ordered      A. 07/17/08 carotid artery duplex scan: no significant stenosis           Review of Systems   Constitutional: Negative.   HENT: Negative.     Eyes: Negative.    Cardiovascular:  Positive for near-syncope.   Respiratory: Negative.     Endocrine: Negative.    Skin: Negative.    Musculoskeletal:  Positive for muscle weakness.   Gastrointestinal: Negative.    Genitourinary: Negative.    Neurological: Negative.    Psychiatric/Behavioral: Negative.         Physical Exam   Vitals reviewed.  HENT:   Head: Normocephalic and atraumatic.   Eyes: No scleral icterus.   Cardiovascular: Normal  rate, regular rhythm and normal heart sounds.   Pulmonary/Chest: Effort normal and breath sounds normal.   Abdominal: Normal appearance.   Musculoskeletal:         General: Normal range of motion.      Cervical back: Normal range of motion. Neurological: He is alert and oriented to person, place, and time.   Skin: Skin is warm and dry.   Psychiatric: Mood, judgment and thought content normal.     Cardiovascular Studies  Preliminary ECG today shows AV dual paced rhythm at 64 bpm, PR 176 ms, QRS 206 ms, and QTc 499 ms.    Device interrogation as noted above    Cardiovascular Health Factors  Vitals BP Readings from Last 3 Encounters:   04/28/24 (!) 172/81   01/11/24 113/59   11/29/23 135/72     Wt Readings from Last 3 Encounters:   04/28/24 96.3 kg (212 lb 6.4 oz)   02/15/24 95.7 kg (211 lb)   01/11/24 93.9 kg (207 lb)     BMI Readings from Last 3 Encounters:   04/28/24 29.62 kg/m?   02/15/24 28.62 kg/m?   01/11/24 28.07 kg/m?      Smoking Tobacco Use History[1]   Lipid Profile Cholesterol   Date Value Ref Range Status   03/27/2023 71 <200 MG/DL Final     HDL   Date Value Ref Range Status   03/27/2023 42 >40 MG/DL Final     LDL   Date Value Ref Range Status   03/27/2023 21 <100 mg/dL Final     Triglycerides   Date Value Ref Range Status   03/27/2023 124 <150 MG/DL Final      Blood Sugar Hemoglobin A1C   Date Value Ref Range Status   03/26/2023 6.7 (H) 4.0 - 5.7 % Final     Comment:     The ADA recommends that most patients with type 1 and type 2 diabetes maintain   an A1c level <7%.       Glucose   Date Value Ref Range Status   11/19/2023 143 (H) 70 - 105 Final   11/16/2023 110 (H) 70 - 100 mg/dL Final   94/84/7974 883 (H) 70 - 100 mg/dL Final     Glucose, POC   Date Value Ref Range Status   11/16/2023 127 (H) 70 - 100 mg/dL Final   94/84/7974 857 (H) 70 - 100 mg/dL Final   94/84/7974 869 (H) 70 - 100 mg/dL Final          Problems Addressed Today  Encounter Diagnoses   Name Primary?    Screening for heart disease Yes    Presence of Watchman left atrial appendage closure device     PAF (paroxysmal atrial fibrillation) (CMS-HCC)     Primary hypertension     Chronic diastolic heart failure, NYHA class 2 (CMS-HCC)     Cardiac pacemaker        Assessment and Plan     Permanent pacemaker  He has a left sided Medtronic dual-chamber pacemaker with recent remote showing stable function.  Device programmed DDDR 60/130.  AP 98% with VP 99.5%.  He denies any discomfort at the left chest device site.  Continue to monitor remotely.    Paroxysmal atrial fibrillation  Watchman device  He is not on any antiarrhythmic medications.  With Watchman device he remains on ASA 81 mg daily.  Recent device interrogation showed AF/AFL episodes with an overall burden of 1.6%.  Continue to monitor for recurrent episodes  via his pacemaker.    NSVT  He has brief episodes of NSVT lasting only 3 seconds or less.  He is not aware of these.  Bisoprolol  5 mg daily was increased to twice daily back in March.  Continue to monitor for recurrent episodes.    Hypertension  Initial blood pressure was 172/81.  Repeat at the end of the visit was 144/82.  Metoprolol  5 mg daily was increased to twice daily in March with Amlodipine  held at that time.  Patient tells me he resumed amlodipine  5 mg daily sometime in May.  He is also on losartan  100 mg daily.  He has not checked his blood pressures at home regularly.  I asked him to start to check them regularly and keep a log.    Chronic diastolic heart failure  He appears euvolemic on exam today.  Spironolactone  25 mg daily is on his medication list but he tells me he has not taken this for quite some time (although they keep sending him refills).  He is on Lasix  20 mg daily which he says is working well to control his edema.  I did not remove Spironolactone  from his list today but again he reports not taking this for several months at least.    He has follow-up with Dr. Debroah scheduled 12/9 and with Dr. Cathlean 08/11/2024.    Of note, he has several medications that are no longer active and I remove these from his list today.    Deward Gauss, APRN-C     Total Time Today was 37 minutes in the following activities: Preparing to see the patient, Obtaining and/or reviewing separately obtained history, Performing a medically appropriate examination and/or evaluation, Counseling and educating the patient/family/caregiver, Documenting clinical information in the electronic or other health record, Independently interpreting results (not separately reported) and communicating results to the patient/family/caregiver, and Care coordination (not separately reported)             Current Medications (including today's revisions)   acetaminophen  (TYLENOL ) 325 mg tablet Take two tablets by mouth every 4 hours as needed. TOTAL ACETAMINOPHEN  DOSE NOT TO EXCEED 4GM DAILY  Indications: pain    albuterol  sulfate (PROAIR  HFA) 90 mcg/actuation HFA aerosol inhaler Inhale one puff by mouth into the lungs every 6 hours as needed.    amLODIPine  (NORVASC ) 5 mg tablet     amoxicillin-potassium clavulanate (AUGMENTIN) 875/125 mg tablet Take one tablet by mouth every 12 hours.    aspirin  EC (ASPIR-LOW) 81 mg tablet Take one tablet by mouth daily. Indications: s/p Watchman    bisoprolol  fumarate (ZEBETA ) 5 mg tablet Take one tablet by mouth twice daily.    calcium carbonate (TUMS) 500 mg (200 mg elemental calcium) chewable tablet Chew one tablet by mouth daily as needed.    CHOLEcalciferoL (vitamin D3) (VITAMIN D3) 50 mcg (2,000 unit) tablet Take one tablet by mouth every 48 hours.    coenzyme Q10 100 mg cap Take one capsule by mouth three times weekly. Monday, Wednesday and Friday.    Cranberry 500 mg cap Take one capsule by mouth daily.    cyanocobalamin  (vitamin B-12) 1,000 mcg tablet Take one tablet by mouth every 48 hours.    evolocumab  (REPATHA  SURECLICK) 140 mg/mL injectable PEN Inject 1 mL under the skin every 14 days.    ezetimibe  (ZETIA ) 10 mg tablet TAKE 1 TABLET EVERY DAY    ferrous sulfate  (FEOSOL) 325 mg (65 mg iron ) tablet Take one tablet by mouth three times weekly. Take  on an empty stomach at least 1 hour before or 2 hours after food. Takes on Monday, Wednesday and Friday    furosemide  (LASIX ) 40 mg tablet Take 1/2-1 tablet by mouth daily as directed for swelling and weight gain.    glucosamine/chondro su A/C/Mn (GLUCOSAMINE-CHONDROITIN COMPLX PO) Take 1 tablet by mouth daily.    HYDROcodone /acetaminophen  (NORCO) 5/325 mg tablet Take 1.5 tablets by mouth every 4 hours as needed. Indications: acute postoperative pain    levothyroxine  (SYNTHROID ) 175 mcg tablet Take one tablet by mouth daily 30 minutes before breakfast.    losartan  (COZAAR ) 100 mg tablet TAKE 1 TABLET EVERY DAY    magnesium  glycinate 100 mg magnesium  capsule Take one capsule by mouth every 48 hours.    metFORMIN  (GLUCOPHAGE ) 500 mg tablet Take one tablet by mouth twice daily with meals. Ok to resume 06/06/23  Indications: type 2 diabetes mellitus    naloxone  (NARCAN ) 4 mg/actuation nasal spray Insert 1 spray into 1 nostril as needed for signs of opioid overdose then call 911. May repeat dose every 2-3 minutes (alternate nostrils) until medical team arrives.  Indications: decrease in rate & depth of breathing due to opioid drug    omeprazole DR(+) (PRILOSEC) 20 mg PO capsule Take one capsule by mouth daily.    phenoL (CHLORASEPTIC) 1.4 % mouth spray Take two sprays by mouth or throat as directed as Needed. Indications: throat irritation    polyethylene glycol 3350  (MIRALAX ) 17 g packet Take one packet by mouth daily as needed. Indications: constipation    potassium chloride  SR (KLOR-CON  M20) 20 mEq tablet Take one tablet by mouth daily. Take while taking furosemide .  Indications: prevention of low potassium in the blood    rosuvastatin  (CRESTOR ) 20 mg tablet TAKE 1 TABLET EVERY DAY    spironolactone  (ALDACTONE ) 25 mg tablet TAKE 1 TABLET EVERY DAY WITH FOOD    vitamins, multiple tablet Take one tablet by mouth every 48 hours.                 [1]   Social History  Tobacco Use   Smoking Status Former    Current packs/day: 0.00    Average packs/day: 1 pack/day for 40.0 years (40.0 ttl pk-yrs)    Types: Cigarettes    Start date: 06/02/1964 Quit date: 06/02/2004    Years since quitting: 19.9   Smokeless Tobacco Never

## 2024-05-07 ENCOUNTER — Encounter: Admit: 2024-05-07 | Discharge: 2024-05-07 | Payer: MEDICARE

## 2024-05-07 MED ORDER — FUROSEMIDE 40 MG PO TAB
ORAL_TABLET | ORAL | 3 refills | 90.00000 days | Status: AC
Start: 2024-05-07 — End: ?

## 2024-05-09 ENCOUNTER — Encounter: Admit: 2024-05-09 | Discharge: 2024-05-09 | Payer: MEDICARE

## 2024-05-09 MED ORDER — FUROSEMIDE 40 MG PO TAB
ORAL_TABLET | 3 refills
Start: 2024-05-09 — End: ?

## 2024-05-21 ENCOUNTER — Encounter: Admit: 2024-05-21 | Discharge: 2024-05-21 | Payer: MEDICARE

## 2024-05-27 ENCOUNTER — Encounter: Admit: 2024-05-27 | Discharge: 2024-05-27 | Payer: MEDICARE

## 2024-05-31 ENCOUNTER — Encounter: Admit: 2024-05-31 | Discharge: 2024-05-31 | Payer: MEDICARE

## 2024-06-02 ENCOUNTER — Encounter: Admit: 2024-06-02 | Discharge: 2024-06-02 | Payer: MEDICARE

## 2024-06-04 ENCOUNTER — Encounter: Admit: 2024-06-04 | Discharge: 2024-06-04 | Payer: MEDICARE

## 2024-06-05 ENCOUNTER — Encounter: Admit: 2024-06-05 | Discharge: 2024-06-05 | Payer: MEDICARE

## 2024-06-05 MED FILL — REPATHA SURECLICK 140 MG/ML SC PNIJ: 140 mg/mL | SUBCUTANEOUS | 84 days supply | Qty: 6 | Fill #2 | Status: AC

## 2024-06-10 ENCOUNTER — Encounter: Admit: 2024-06-10 | Discharge: 2024-06-10 | Payer: MEDICARE

## 2024-06-10 VITALS — BP 136/68 | HR 66 | Ht 71.0 in | Wt 211.4 lb

## 2024-06-10 DIAGNOSIS — I739 Peripheral vascular disease, unspecified: Secondary | ICD-10-CM

## 2024-06-10 DIAGNOSIS — I10 Essential (primary) hypertension: Secondary | ICD-10-CM

## 2024-06-10 DIAGNOSIS — I2510 Atherosclerotic heart disease of native coronary artery without angina pectoris: Secondary | ICD-10-CM

## 2024-06-10 DIAGNOSIS — M5440 Lumbago with sciatica, unspecified side: Secondary | ICD-10-CM

## 2024-06-10 DIAGNOSIS — J9621 Acute and chronic respiratory failure with hypoxia: Principal | ICD-10-CM

## 2024-06-10 DIAGNOSIS — Z953 Presence of xenogenic heart valve: Secondary | ICD-10-CM

## 2024-06-10 NOTE — Patient Instructions [37]
 Thank you for visiting our office today.    We would like to make the following medication adjustments:   NONE       Otherwise continue the same medications as you have been doing.          We will be pursuing the following tests after your appointment today:       Orders Placed This Encounter    AMB REFERRAL TO [facility]         We will plan to see you back in 6 months.  Please call us  in the meantime with any questions or concerns.        Please allow 5-7 business days for our providers to review your results. All normal results will go to MyChart. If you do not have Mychart, it is strongly recommended to get this so you can easily view all your results. If you do not have mychart, we will attempt to call you once with normal lab and testing results. If we cannot reach you by phone with normal results, we will send you a letter.  If you have not heard the results of your testing after one week please give us  a call.       Your Cardiovascular Medicine [place] Team [place], [place], [place], and [place])  phone number is [phone].

## 2024-06-10 NOTE — Progress Notes [1]
 Date of Service: 06/10/2024    Mason Lawson is a 78 y.o. male.       HPI   Mason Lawson is followed for coronary artery diseases, sinus bradycardia, carotid artery disease, hyperlipidemia, aortic stenosis, hypertension, heart failure with preserved ejection fraction and paroxysmal atrial fibrillation.  He underwent successful transcatheter aortic valve replacement on June 04, 2023.  Most recently he underwent right carotid endarterectomy with a bovine pericardial patch angioplasty on 11/14/2023.  He has seen cardiac EP in clinic on 09/26/2023 for nonsustained ventricular tachycardia.  His bisoprolol  was increased to 5 mg twice daily. 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  He admits to lifting over 100 pounds and using a jackhammer.  After performing this heavy activity he developed left sciatica about 2-1/2 weeks ago.  His blood pressure has been well-controlled when checked outside the office.  Otherwise, Mason Lawson indicates that he has been stable and he reports no angina, congestive symptoms, palpitations, sensation of sustained forceful heart pounding, lightheadedness or syncope.  The patient reports no myalgias, bleeding abnormalities, or strokelike symptoms.     Historically, Mason Lawson underwent coronary artery bypass surgery in December 2005. At that time his revascularization consisted of an IMA graft to the LAD with a right internal mammary going to the obtuse marginal and a radial to the posterior descending artery. I saw Mason Lawson on 09/23/10 after an abnormal stress test was recently obtained. On 09/23/10 he told me that he had infrequent mild angina which he had noted over the past year. It occurred less than once a month and only when he was walking uphill and only when he was walking shortly after a meal. On 09/17/10 he walked 6 blocks in 10 minutes during a parade and had no angina because he had not eaten recently and was not walking uphill. I reviewed his stress test with him and he wanted to proceed with coronary angiography which was performed on 09/26/10. Intervention was not required. On 09/30/10 carvedilol  was added to his medical regimen for BP control and he has tolerated this medication without difficulty. On 10/21/10 HCTZ 25 mg with triamterene  37.5 mg daily was also added to his medical regimen for improved BP control.  The patient says that he had cellulitis in his left lower extremity in September 2015 that resolved with antibiotic therapy. Mason Lawson developed dyspnea with exertion and underwent right and left heart catheterization/coronary angiography on 06/18/2018. Intervention was not required.He reports that he developed Covid illness on July 07 2019 with mild to moderate symptoms.  Mason Lawson reports that he had a chest x-ray and there was no evidence for pneumonia.  He was placed on supplemental oxygen which he has used occasionally since developing Covid illness. He was hospitalized in [place] in April 2021 for hypertension with exacerbation of congestive heart failure.  . He had typical atrial flutter with bradycardic response and was hospitalized in July 2022 and seen by Dr. Roselyn on consult service.  On January 21, 2021 Dr. Liborio did cavotricuspid isthmus ablation.  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 Rubin implanted a dual-chamber pacemaker. Mason Lawson underwent placement of a Watchman left atrial appendage occlusion device on 02/20/2022.  Follow-up transesophageal echocardiography performed on 04/05/2022 showed a well-seated Watchman device with no evidence of peridevice leak.  His permanent pacemaker then revealed episodes of nonsustained ventricular tachycardia and a stress test was obtained to assess for an ischemic trigger for these episodes.  It was abnormal and coronary angiography with coronary intervention to the right posterior descending artery was performed on 06/22/2022.     Objective   Vitals:    06/10/24 1055   BP: 136/68   BP Source: Arm, Right Upper   Pulse: 66   SpO2: 94%   O2 Device: None (Room air)   PainSc: Four   Weight: 95.9 kg (211 lb 6.4 oz)   Height: 180.3 cm (5' 11)     Body mass index is 29.48 kg/m?SABRA     Past Medical History  Patient Active Problem List    Diagnosis Date Noted    Carotid stenosis, right 11/14/2023    Right-sided extracranial carotid artery stenosis 11/13/2023    PAD (peripheral artery disease) 11/13/2023    Stage 1 mild COPD by GOLD classification (CMS-HCC) 06/04/2023    S/p TAVR (transcatheter aortic valve replacement), bioprosthetic 06/04/2023     06/04/23: Dr. Tony and Dr. Leva       Bilateral carotid artery stenosis 05/30/2023    Atherosclerosis of aorta 05/29/2023    Former smoker 05/29/2023    Aortic valve disease 03/26/2023    Acute and chronic respiratory failure with hypoxia (CMS-HCC) 12/05/2022    Coronary artery disease due to lipid rich plaque 06/22/2022    Presence of Watchman left atrial appendage closure device 02/21/2022     02/20/2022 - Dr. Cathlean; Left atrial appendage occlusion with 31 mm Watchman FLX device      PAF (paroxysmal atrial fibrillation) (CMS-HCC) 02/20/2022    Cardiac pacemaker 05/16/2021     01/21/2021 - Successful Dual-chamber Pacemaker Implantation with Dr. Rubin      Atrial fibrillation (CMS-HCC) 05/13/2021    Bradycardia 02/01/2021    DM (diabetes mellitus) type II, controlled, with peripheral vascular disorder (CMS-HCC) 02/01/2021    Paroxysmal nocturnal dyspnea 02/01/2021    Atrial flutter (CMS-HCC) 01/19/2021     01/21/2021 - AFL RFA with Dr. Liborio:  Successful cavo-tricuspid isthmus ablation.  Comprehensive EP study with coronary sinus catheter placement.  Underlying severe sinus node dysfunction. No escape rhythm.      Chronic diastolic heart failure, NYHA class 2 (CMS-HCC) 12/04/2018     RIGHT HEART CATHETERIZATION:  06/2018 in the setting of severe hypertension  HEMODYNAMICS:   Blood pressure 198/81 with a mean of 130 mmHg. Heart rate 50 beats per minute.   PRESSURES:  The right atrial pressure was 4 mmHg. Right ventricular pressure was 64/8 mmHg.  The pulmonary artery pressure was 60/70 mmHg with a mean of 32 mmHg. The pulmonary capillary wedge pressure was 17 mmHg. The transpulmonary gradient was 15 mmHg.  The diastolic pulmonary gradient was 0 mmHg. The pulmonary vascular resistance was 2 Wood units.   RIGHT SATURATIONS: The right atrial saturation 77%. The pulmonary artery saturation was 75%. The aortic saturation was 96%.   OUTPUTS: The cardiac output by Fick method was 5.8 L/minute with a cardiac index of 2.7 L/minute per sq m. The cardiac index by thermodilution was 7.3 with a cardiac index of 3.4 L/minute per sq m.      Dyslipidemia 10/23/2012    Chest pain 09/26/2010    Abnormal stress test 09/26/2010    Murmur 09/23/2010    Coronary artery disease      A. Onset mid 2004, progression fall 2005, patient reduces activity at work to avoid symptoms  B. 12/05 Progressive angina, Dr. Naomia Braver, then 06/29/04 CBP Atch OV, referred for cath          >12/29 Cath 100%RCA, 75%LAD&Cx,50%LM EF 55%          >07/01/05 CABGx3 LIMA-LAD w/ Y-RIMA-OM, Rad-PDA per Dr. Tony  C. 06/29/08 exercise echocardiographic images demonstrated basal septal ischemia, significant hypertensive response to exercise, good and adequate exercise tolerance with no angina.  D. LHC 06/2018 for new heart failure symptoms: LEFT HEART CATHETERIZATION:  HEMODYNAMICS:  The aortic pressure was- 195/63 with a mean of 105 mmHg .  The left ventricular systolic pressure was 210 mmHg.  There was a peak to peak gradient of 15 mmHg.  Left main coronary artery:  Left main coronary artery arises normally from the left coronary sinus.  The left main coronary artery bifurcates into left anterior descending and left circumflex artery and is free of angiographically significant disease.  Left anterior descending:  Left anterior descending is a large vessel which gives rise to- 3 diagonal vessels. 1 diagonal has a 80% ostial stenosis, the rest of diagonal vessels do not appear to be angiographically significant. Distal LAD has competitive flow from the LIMA.   Left circumflex artery:  The left circumflex artery appears to be medium caliber and has a 100% stenosis proximally. The mid-to-distal portion of the vessel fills from bridging collaterals from proximal portion and is diffusely disease. Three obtuse marginal branches arise, which are also diffusely diseased.   Right coronary artery:  The right coronary artery is a medium caliber vessel, but has 100% chronic total occlusion after the takeoff of conus branch. The PDA fills retrogradely from the radial graft to supply the PLV and has mild plaquing in the native PDA.    BYPASS GRAFT ANATOMY:  1.The LIMA to LAD was visualized using IM catheter.  The ostium, body was noted to be free of any angiographic disease, but has a 80% stenosis at the anastamosis with diffusely diseased distal LAD. There is a Y jump graft from the LIMA to the OM branch which fills retrogradely.            2. The radial artery graft to the PDA appears to be without any angiographically significant disease at ostium, body or anastomotic site.   E. Regadenosom MPI 11/2018 TID Ratio:  1.05  (normal <1.36). Summed Stress Score:  3   , Summed Rest Score:  0 Quantitative polar maps show a statistically insignificant amount of inferoapical ischemia. Mild inferobasilar septal hypokinesis.       Left Ventricular Ejection Fraction (post stress, in the resting state) =  52 %.  Left Ventricular End Diastolic Volume: 824 mL       SUMMARY/OPINION:   This study is borderline abnormal with limited reversible uptake in the inferior wall.  All segments are viable.  Global left ventricular function is within normal limits. In aggregate the current study is low risk in regards to predicted annual cardiovascular mortality rate.         Hypercholesterolemia      A.  02/28/01 total 324 total 249 HDL 43 LDL 231 Pt deferred meds  B. 06/29/04 total 273 trig 193 HDL 36 LDLd206 Zocor begun dc'd by pt d/t statinphobia and cost  C.  2/24 Rx Vytorin 10/80 1/2 tab.      Hypothyroidism      A.  2004 presentation with TSH over 200, Rx w l-thyroxine      Hypertension      A.  2/06 Altace 5 dc'd w/ dysgeusia, Metoprolol  25 BID begun         Tests ordered      A. 07/17/08 carotid artery duplex scan: no significant stenosis         Review of Systems   Constitutional: Negative.   HENT: Negative.     Eyes: Negative.    Cardiovascular: Negative.    Respiratory: Negative.     Endocrine: Negative.    Hematologic/Lymphatic: Negative.    Skin: Negative.    Musculoskeletal:  Positive for back pain, falls, joint pain and muscle cramps.   Gastrointestinal: Negative.    Genitourinary: Negative.    Neurological: Negative.    Psychiatric/Behavioral: Negative.     Allergic/Immunologic: Negative.        Physical Exam  GENERAL: The patient is well developed, well nourished, resting comfortably and in no distress.   HEENT: No abnormalities of the visible oro-nasopharynx, conjunctiva or sclera are noted.  NECK: There is no jugular venous distension. Carotids are palpable and without bruits. There is no thyroid enlargement.  Right carotid artery surgery incision looks good.  Chest: Lung fields are clear to auscultation. There are no wheezes or crackles.  CV: There is a regular rhythm. The first and second heart sounds are normal. There are no murmurs, gallops or rubs.  ABD: The abdomen is soft and supple with normal bowel sounds. There is no hepatosplenomegaly, ascites, tenderness, masses or bruits.  Neuro: There are no focal motor defects. Ambulation is normal. Cognitive function appears normal.  Ext: There is trace bipedal and pretibial edema without evidence of deep vein thrombosis. Peripheral pulses are satisfactory.    SKIN: There are no rashes and no cellulitis.  The cellulitis on his left anterior tibial surface has healed.  PSYCH: The patient is calm, rationale and oriented.    Cardiovascular Studies  A twelve-lead ECG obtained on April 28, 2024 reveals an AV dual paced rhythm with a heart rate of 64 bpm.    Echo Doppler 07/18/2023, 1 month following TAVR.:  Echocardiographic Findings     Left Ventricle The left ventricular size is normal. The left ventricular wall thickness is normal. The left ventricular systolic function is mildly reduced. EF 50 +/-5%. There are segmental wall motion abnormalities, as described below. Grade II (moderate) left ventricular diastolic dysfunction. Elevated left atrial pressure.   Right Ventricle The right ventricular size is normal. The right ventricular systolic function is mildly reduced. Pacemaker lead present in the ventricle.   Left Atrium Moderately dilated. There is probably residual atrial septal defect with color Doppler not well-visualized   Right Atrium Normal size. Pacemaker lead present in the right atrium.   IVC/SVC Normal central venous pressure (0-5 mm Hg).   Mitral Valve Non-specific thickening. There is mitral annular calcification.   Tricuspid Valve Normal valve structure. No stenosis. Trace regurgitation.   Aortic Valve There is a 26 mm Sapien S3 Resilia bioprosthetic valve present. Mean gradient of 8 mmHg. Mild paravalvular leak   Pulmonary The pulmonic valve was not seen well but no Doppler evidence of stenosis.   Aorta The aortic root and ascending aorta are normal in size.   Pericardium No pericardial effusion.             Left Ventricular Wall Scoring                 Resting Score Index: 1.59  The following segments are akinetic: basal inferoseptal, basal inferior and basal inferolateral.  The following segments are hypokinetic: mid inferoseptal, mid inferolateral, apical septal and apex.  All other segments are normal.            Cardiovascular Health Factors  Vitals BP Readings from Last 3 Encounters:   06/10/24 136/68   04/28/24 (!) 172/81   01/11/24 113/59     Wt Readings from Last 3 Encounters:   06/10/24 95.9 kg (211 lb 6.4 oz)   04/28/24 96.3 kg (212 lb 6.4 oz)   02/15/24 95.7 kg (211 lb)     BMI Readings from Last 3 Encounters:   06/10/24 29.48 kg/m?   04/28/24 29.62 kg/m?   02/15/24 28.62 kg/m?      Smoking Tobacco Use History[1]   Lipid Profile Cholesterol   Date Value Ref Range Status   05/13/2024 73  Final     HDL   Date Value Ref Range Status   05/13/2024 37 (L) >=40 Final     LDL   Date Value Ref Range Status   05/13/2024 26  Final     Triglycerides   Date Value Ref Range Status   05/13/2024 52  Final      Blood Sugar Hemoglobin A1C   Date Value Ref Range Status   03/26/2023 6.7 (H) 4.0 - 5.7 % Final     Comment:     The ADA recommends that most patients with type 1 and type 2 diabetes maintain   an A1c level <7%.       Glucose   Date Value Ref Range Status   05/13/2024 134 (H) 70 - 105 Final   11/19/2023 143 (H) 70 - 105 Final   11/16/2023 110 (H) 70 - 100 mg/dL Final     Glucose, POC   Date Value Ref Range Status   11/16/2023 127 (H) 70 - 100 mg/dL Final   94/84/7974 857 (H) 70 - 100 mg/dL Final   94/84/7974 869 (H) 70 - 100 mg/dL Final         Problems Addressed Today  Encounter Diagnoses   Name Primary?    Acute low back pain with sciatica, sciatica laterality unspecified, unspecified back pain laterality Yes    Acute and chronic respiratory failure with hypoxia (CMS-HCC)     S/p TAVR (transcatheter aortic valve replacement), bioprosthetic     PAD (peripheral artery disease)     Primary hypertension     Coronary artery disease due to lipid rich plaque        Assessment and Plan   Mr. Stites currently appears to be doing well from a cardiovascular perspective.  He reports no angina or congestive symptoms.  His blood pressure and LDL cholesterol appear well-controlled.  However, he is uncomfortable from left-sided sciatica.  At his request I have placed a referral to the spine center at [facility].  Cardiovascular risk factor modification was reviewed in detail.  I have asked him to return for follow-up in 6 months time. The total time spent during this interview and exam with preparation and chart review was 30 minutes.            Current Medications (including today's revisions)   amLODIPine  (NORVASC ) 5 mg tablet     aspirin  EC (ASPIR-LOW) 81 mg tablet Take one tablet by mouth daily. Indications: s/p Watchman    bisoprolol  fumarate (ZEBETA ) 5 mg tablet Take one tablet by mouth twice daily.    calcium carbonate (TUMS)  500 mg (200 mg elemental calcium) chewable tablet Chew one tablet by mouth daily as needed.    CHOLEcalciferoL (vitamin D3) (VITAMIN D3) 50 mcg (2,000 unit) tablet Take one tablet by mouth every 48 hours.    coenzyme Q10 100 mg cap Take one capsule by mouth three times weekly. Monday, Wednesday and Friday.    Cranberry 500 mg cap Take one capsule by mouth daily.    cyanocobalamin  (vitamin B-12) 1,000 mcg tablet Take one tablet by mouth every 48 hours.    cyclobenzaprine (FLEXERIL) 10 mg tablet Take one tablet by mouth three times daily as needed.    evolocumab  (REPATHA  SURECLICK) 140 mg/mL injectable PEN Inject 1 mL under the skin every 14 days.    ezetimibe  (ZETIA ) 10 mg tablet TAKE 1 TABLET EVERY DAY    ferrous sulfate  (FEOSOL) 325 mg (65 mg iron ) tablet Take one tablet by mouth three times weekly. Take on an empty stomach at least 1 hour before or 2 hours after food. Takes on Monday, Wednesday and Friday    furosemide  (LASIX ) 40 mg tablet Take 1/2-1 tablet by mouth daily as directed for swelling and weight gain. (Patient taking differently: Take one-half tablet by mouth every morning. Take 1/2-1 tablet by mouth daily as directed for swelling and weight gain.)    glucosamine/chondro su A/C/Mn (GLUCOSAMINE-CHONDROITIN COMPLX PO) Take 1 tablet by mouth daily.    levothyroxine  (SYNTHROID ) 175 mcg tablet Take one tablet by mouth daily 30 minutes before breakfast.    losartan  (COZAAR ) 100 mg tablet TAKE 1 TABLET EVERY DAY    magnesium  glycinate 100 mg magnesium  capsule Take one capsule by mouth every 48 hours.    metFORMIN  (GLUCOPHAGE ) 500 mg tablet Take one tablet by mouth twice daily with meals. Ok to resume 06/06/23  Indications: type 2 diabetes mellitus    omeprazole DR(+) (PRILOSEC) 20 mg PO capsule Take one capsule by mouth daily.    potassium chloride  SR (KLOR-CON  M20) 20 mEq tablet Take one tablet by mouth daily. Take while taking furosemide .  Indications: prevention of low potassium in the blood    rosuvastatin  (CRESTOR ) 20 mg tablet TAKE 1 TABLET EVERY DAY    spironolactone  (ALDACTONE ) 25 mg tablet TAKE 1 TABLET EVERY DAY WITH FOOD (Patient not taking: Reported on 06/10/2024)    vitamins, multiple tablet Take one tablet by mouth every 48 hours.                 [1]   Social History  Tobacco Use   Smoking Status Former    Current packs/day: 0.00    Average packs/day: 1 pack/day for 40.0 years (40.0 ttl pk-yrs)    Types: Cigarettes    Start date: 06/02/1964    Quit date: 06/02/2004    Years since quitting: 20.0   Smokeless Tobacco Never

## 2024-06-13 ENCOUNTER — Encounter: Admit: 2024-06-13 | Discharge: 2024-06-13 | Payer: MEDICARE

## 2024-06-17 ENCOUNTER — Encounter: Admit: 2024-06-17 | Discharge: 2024-06-17 | Payer: MEDICARE

## 2024-06-17 ENCOUNTER — Ambulatory Visit: Admit: 2024-06-17 | Discharge: 2024-06-18 | Payer: MEDICARE

## 2024-06-17 VITALS — BP 121/65 | HR 90 | Ht 71.0 in

## 2024-06-17 DIAGNOSIS — M5416 Radiculopathy, lumbar region: Principal | ICD-10-CM

## 2024-06-17 MED ORDER — GABAPENTIN 300 MG PO CAP
300 mg | ORAL_CAPSULE | Freq: Two times a day (BID) | ORAL | 2 refills | 30.00000 days | Status: AC
Start: 2024-06-17 — End: ?

## 2024-06-17 NOTE — Progress Notes [1]
 [facility]         HISTORY OF PRESENT ILLNESS:      Referring provider: Elspeth KATHEE Alexander    Chief complaint: Pain of the Lower Back      06/17/24   Mason Lawson is a 78 y.o. 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       Mason Lawson is a 78 year old male with a history of pacemaker, chronic respiratory failure, and aortic valve replacement who presents with low back pain and left leg weakness.    His back pain began earlier this fall, initially in the middle of his back, lasting about two weeks. He managed the pain by rolling it out on the floor, which provided relief at that time. However, after using a jackhammer for about an hour, he developed severe pain in his lower back, specifically on the left side, which was unresponsive to his previous method of relief.    The pain radiates into his left hip and leg. He experiences aching in his hip and leg, which sometimes requires him to take Tylenol , especially at night or after physical activity. The pain varies depending on his activity level, such as walking or sitting.    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    No numbness or tingling in his legs, but he reports a dull ache. He has experienced episodes where his left leg 'just collapses,' leading to falls, such as when trying to get back into his fleeta after Thanksgiving. He now exercises caution when using stairs and reports difficulty with activities like climbing a swimming ladder due to lack of strength in his left leg.    He has not previously undergone physical therapy, chiropractic treatment, or massage for his back. He has not had any surgeries or injections on his spine. He has tried medications such as tramadol  and Flexeril without significant relief.    He is currently taking baby aspirin  once a day and has tried Tylenol  for pain relief. He has a history of a Watchman device and aortic valve replacement, which has allowed him to discontinue blood thinners.        Patient-entered data is noted with quotes.    Pain location: (Patient-Rptd) Lower back  Pain radiation:   Does the pain move into your arm or leg?: (Patient-Rptd) Yes  Explanation of how far pain moves:: (Patient-Rptd) Makes my left leg weak  Pain start date: (Patient-Rptd) 0-6 months     Inciting event: (Patient-Rptd) Yes       Description of pain: (Patient-Rptd) Aching    Numbness/tingling/weakness: (Patient-Rptd) Leg      Bladder or bowel retention or incontinence?: No  Exacerbating factors: (Patient-Rptd) Other (comment) (Patient-Rptd) Not sure yet    Alleviating factors: (Patient-Rptd) Ice, Medications      Imaging:         Physical therapy/home exercise:  Anticoagulants:   Are you taking one of the following blood thinners?: (Patient-Rptd) aspirin  81mg     TREATMENT FOR CONDITION DATE  % OF RELIEF   Physical Therapy            How long did you do physical therapy? (in weeks)              Chiropractic Care            How many times did you see the chiropractor?              Injection                What type of spine injection have you had in the past? Surgery    (Patient-Rptd) No         What type of spine surgery have you had in the past?              Pain diagram:               Past Medical History:    Abnormal stress test    Atrial fibrillation (CMS-HCC)    Cardiac pacemaker    Chest pain    Cigarette smoker    COPD (chronic obstructive pulmonary disease) (CMS-HCC)    Coronary artery disease    Former cigarette smoker    GERD (gastroesophageal reflux disease)    Hypercholesterolemia    Hypertension    Hypothyroidism    Presence of Watchman left atrial appendage closure device    Tests ordered    Ulcer    Undiagnosed cardiac murmurs    Valvular heart disease         Surgical History:   Procedure Laterality Date    HX CORONARY ARTERY BYPASS GRAFT  2005    x3 vessels    HEART CATHETERIZATION  2005    HEART CATHETERIZATION  2012    HEART CATHETERIZATION  2019    ANGIOGRAPHY CORONARY ARTERY WITH RIGHT AND LEFT HEART CATHETERIZATION N/A 06/18/2018    Performed by Freeman Donnice NOVAK, MD, FACC at [facility] CATH LAB    POSSIBLE PERCUTANEOUS CORONARY STENT PLACEMENT WITH ANGIOPLASTY N/A 06/18/2018    Performed by Freeman Donnice NOVAK, MD, FACC at [facility] CATH LAB    ABLATION OF DYSRHYTHMIC FOCUS  01/21/2021    PACEMAKER INSERTION  01/21/2021    INSERTION/ REPLACEMENT PERMANENT PACEMAKER WITH ATRIAL AND VENTRICULAR LEAD Left 01/21/2021    Performed by Ramirez, Rigoberto, MD at [facility] EP LAB    INTRACARDIAC CATHETER ABLATION WITH COMPREHENSIVE ELECTROPHYSIOLOGIC EVALUATION - TYPICAL FLUTTER  01/21/2021    Performed by Liborio Countryman, MD at [facility] EP LAB    PERCUTANEOUS CLOSURE LEFT ATRIAL APPENDAGE WITH ENDOCARDIAL IMPLANT, TRANSEPTAL CATHETERIZATION, FOLEY, CONTRAST ANTICIPATED N/A 02/20/2022    Performed by Cathlean Kipper, MD at [facility] EP LAB    INTRACARDIAC ECHOCARDIOGRAPHY  02/20/2022    Performed by Cathlean Kipper, MD at [facility] EP LAB    PERCUTANEOUS CORONARY STENT PLACEMENT WITH ANGIOPLASTY N/A 06/22/2022    Performed by Erling Camellia RAMAN, MD at [facility] CATH LAB    ANGIOGRAPHY CORONARY ARTERY AND ARTERIAL/VENOUS GRAFTS WITH LEFT HEART CATHETERIZATION N/A 06/22/2022    Performed by Erling Camellia RAMAN, MD at [facility] CATH LAB    ANGIOGRAPHY CORONARY ARTERY WITH LEFT HEART CATHETERIZATION N/A 03/27/2023    Performed by Hajj, Inetta SQUIBB, MD at [facility] CATH LAB    PERCUTANEOUS CORONARY STENT PLACEMENT WITH ANGIOPLASTY N/A 03/27/2023    Performed by Hajj, Inetta SQUIBB, MD  at [facility] CATH LAB    AORTIC VALVE REPLACEMENT  06/04/2023    [place]    Transcatheter Aortic Valve Replacement - Axillary Artery N/A 06/04/2023    Performed by Tony Cordella FALCON, MD at [facility] EP LAB    RIGHT CAROTID ENDARTERECTOMY Right 11/14/2023    Performed by Durwin Elsie BRAVO, MD at [facility] OR    CARDIAC CATHERIZATION      CARDIOVASCULAR STRESS TEST      ELECTROCARDIOGRAM      LEFT ATRIAL APPENDAGE CLOSURE      TRANSESOPHAGEAL ECHO      UMBILICAL ARTERIAL CATH - BEDSIDE           Allergies[1]      Medications Ordered Prior to Encounter[2]      family history includes Hypertension in his mother.      Social History     Socioeconomic History    Marital status: Married   Tobacco Use    Smoking status: Former     Current packs/day: 0.00     Average packs/day: 1 pack/day for 40.0 years (40.0 ttl pk-yrs)     Types: Cigarettes     Start date: 06/02/1964     Quit date: 06/02/2004     Years since quitting: 20.0    Smokeless tobacco: Never   Vaping Use    Vaping status: Never Used   Substance and Sexual Activity    Alcohol use: Yes     Alcohol/week: 2.0 - 6.0 standard drinks of alcohol     Types: 2 - 6 Standard drinks or equivalent per week    Drug use: Never    Sexual activity: Not Currently     Partners: Female     Birth control/protection: None               PHYSICAL EXAM:    Vitals:    06/17/24 1349   BP: 121/65   BP Source: Arm, Left Upper   Pulse: 90   SpO2: 94%   PainSc: Three   Height: 180.3 cm (5' 11)     Oswestry Total Score:: (Patient-Rptd) 32  Pain Score: Three  Body mass index is 29.48 kg/m?SABRA    General: Alert, cooperative, no acute distress.  HEENT: Normocephalic, atraumatic.  Neck: Supple.  Lungs: Unlabored respirations, bilateral and equal chest excursion.  Heart: Regular rate.  Skin: Warm and dry to touch.  Abdomen: Nondistended.    Lumbar spine:  Appearance: No lesions or deformity  Lumbar tenderness: Yes left lower  SI joint tenderness: Negative bilaterally  Pain with extension: Yes  Pain with lateral flexion: Left  Sensation to light touch - left lower extremity: intact  Sensation to light touch - right lower extremity: intact  Straight leg raise: Positive on the left  FABER test: Negative bilaterally  FADIR test: Negative bilaterally  Lateral pelvic compression: Deferred  Thigh thrust: Deferred  Gaenslen test: Deferred    Lower extremity strength:   Root Right Left   Hip Flexion L2 5 5   Knee Flexion L5/S1 5 5   Knee Extension L3 5 5   Dorsiflexion L4 5 5   Plantarflexion S1 5 5   EHL Extension L5 5 5     Neurological: Alert and oriented x3.      RADIOGRAPHIC EVALUATION:    CT abd/pelvis 01/24/24         IMPRESSION:    1. Lumbar radiculopathy    2. Spinal stenosis of lumbar region with radiculopathy    3.  Lumbar herniated disc          PLAN:       Lumbar spinal stenosis with radiculopathy and disc herniation  Chronic low back pain with left leg weakness due to severe L4-L5 stenosis and disc bulge causing nerve compression. Previous medications ineffective.  - Initiated physical therapy focusing on core strengthening for six weeks.  - Prescribed gabapentin  300 mg BID for nerve pain management.  - Consider left L4/5 epidural steroid injection if symptoms persist post-physical therapy.  - Advised to report progress via MyChart after physical therapy.                    [1]   Allergies  Allergen Reactions    Cefdinir NAUSEA ONLY    Oxycodone  NAUSEA AND VOMITING    Percodan [Oxycodone  Hcl-Oxycodone -Asa] NAUSEA AND VOMITING     Tolerates other pain medications well - Oxycodone  is the only pain med he has this issue with.   [2]   Current Outpatient Medications on File Prior to Visit   Medication Sig Dispense Refill    amLODIPine  (NORVASC ) 5 mg tablet       aspirin  EC (ASPIR-LOW) 81 mg tablet Take one tablet by mouth daily. Indications: s/p Watchman 90 tablet 1    bisoprolol  fumarate (ZEBETA ) 5 mg tablet Take one tablet by mouth twice daily. 180 tablet 3    calcium carbonate (TUMS) 500 mg (200 mg elemental calcium) chewable tablet Chew one tablet by mouth daily as needed.      CHOLEcalciferoL (vitamin D3) (VITAMIN D3) 50 mcg (2,000 unit) tablet Take one tablet by mouth every 48 hours.      coenzyme Q10 100 mg cap Take one capsule by mouth three times weekly. Monday, Wednesday and Friday.      Cranberry 500 mg cap Take one capsule by mouth daily.      cyanocobalamin  (vitamin B-12) 1,000 mcg tablet Take one tablet by mouth every 48 hours.      cyclobenzaprine (FLEXERIL) 10 mg tablet Take one tablet by mouth three times daily as needed.      evolocumab  (REPATHA  SURECLICK) 140 mg/mL injectable PEN Inject 1 mL under the skin every 14 days. 6 mL 3    ezetimibe  (ZETIA ) 10 mg tablet TAKE 1 TABLET EVERY DAY 90 tablet 1    ferrous sulfate  (FEOSOL) 325 mg (65 mg iron ) tablet Take one tablet by mouth three times weekly. Take on an empty stomach at least 1 hour before or 2 hours after food. Takes on Monday, Wednesday and Friday      furosemide  (LASIX ) 40 mg tablet Take 1/2-1 tablet by mouth daily as directed for swelling and weight gain. (Patient taking differently: Take one-half tablet by mouth every morning. Take 1/2-1 tablet by mouth daily as directed for swelling and weight gain.) 90 tablet 3    glucosamine/chondro su A/C/Mn (GLUCOSAMINE-CHONDROITIN COMPLX PO) Take 1 tablet by mouth daily.      levothyroxine  (SYNTHROID ) 175 mcg tablet Take one tablet by mouth daily 30 minutes before breakfast.      losartan  (COZAAR ) 100 mg tablet TAKE 1 TABLET EVERY DAY 90 tablet 3    magnesium  glycinate 100 mg magnesium  capsule Take one capsule by mouth every 48 hours.      metFORMIN  (GLUCOPHAGE ) 500 mg tablet Take one tablet by mouth twice daily with meals. Ok to resume 06/06/23  Indications: type 2 diabetes mellitus 60 tablet 3    omeprazole DR(+) (PRILOSEC) 20 mg PO capsule Take  one capsule by mouth daily.      potassium chloride  SR (KLOR-CON  M20) 20 mEq tablet Take one tablet by mouth daily. Take while taking furosemide .  Indications: prevention of low potassium in the blood 90 tablet 0    rosuvastatin  (CRESTOR ) 20 mg tablet TAKE 1 TABLET EVERY DAY 90 tablet 3    spironolactone  (ALDACTONE ) 25 mg tablet TAKE 1 TABLET EVERY DAY WITH FOOD 90 tablet 3    vitamins, multiple tablet Take one tablet by mouth every 48 hours.       No current facility-administered medications on file prior to visit.

## 2024-06-18 ENCOUNTER — Encounter: Admit: 2024-06-18 | Discharge: 2024-06-18 | Payer: MEDICARE

## 2024-06-18 DIAGNOSIS — I10 Essential (primary) hypertension: Secondary | ICD-10-CM

## 2024-06-18 DIAGNOSIS — M5126 Other intervertebral disc displacement, lumbar region: Secondary | ICD-10-CM

## 2024-06-18 DIAGNOSIS — J9621 Acute and chronic respiratory failure with hypoxia: Secondary | ICD-10-CM

## 2024-06-18 DIAGNOSIS — I2510 Atherosclerotic heart disease of native coronary artery without angina pectoris: Secondary | ICD-10-CM

## 2024-06-18 DIAGNOSIS — I2583 Coronary atherosclerosis due to lipid rich plaque: Secondary | ICD-10-CM

## 2024-06-18 DIAGNOSIS — I350 Nonrheumatic aortic (valve) stenosis: Principal | ICD-10-CM

## 2024-06-18 DIAGNOSIS — M48061 Spinal stenosis, lumbar region without neurogenic claudication: Secondary | ICD-10-CM

## 2024-06-18 DIAGNOSIS — Z953 Presence of xenogenic heart valve: Secondary | ICD-10-CM

## 2024-06-18 DIAGNOSIS — M5440 Lumbago with sciatica, unspecified side: Secondary | ICD-10-CM

## 2024-06-18 DIAGNOSIS — I739 Peripheral vascular disease, unspecified: Secondary | ICD-10-CM

## 2024-06-18 MED ORDER — EZETIMIBE 10 MG PO TAB
10 mg | ORAL_TABLET | Freq: Every day | ORAL | 3 refills | 90.00000 days | Status: AC
Start: 2024-06-18 — End: ?

## 2024-06-19 ENCOUNTER — Encounter: Admit: 2024-06-19 | Discharge: 2024-06-19 | Payer: MEDICARE

## 2024-06-19 DIAGNOSIS — Z953 Presence of xenogenic heart valve: Principal | ICD-10-CM

## 2024-06-20 ENCOUNTER — Encounter: Admit: 2024-06-20 | Discharge: 2024-06-20 | Payer: MEDICARE

## 2024-06-23 ENCOUNTER — Encounter: Admit: 2024-06-23 | Discharge: 2024-06-23 | Payer: MEDICARE

## 2024-06-23 ENCOUNTER — Ambulatory Visit: Admit: 2024-06-23 | Discharge: 2024-06-23 | Payer: MEDICARE

## 2024-06-24 ENCOUNTER — Encounter: Admit: 2024-06-24 | Discharge: 2024-06-24 | Payer: MEDICARE

## 2024-07-12 ENCOUNTER — Encounter: Admit: 2024-07-12 | Discharge: 2024-07-12 | Payer: MEDICARE

## 2024-07-30 ENCOUNTER — Encounter: Admit: 2024-07-30 | Discharge: 2024-07-30 | Payer: MEDICARE

## 2024-07-30 NOTE — Telephone Encounter [36]
 Called and left message for patient to call back Valve clinic @ [phone] to complete a 1 year Post TAVR kccq

## 2024-08-01 ENCOUNTER — Encounter: Admit: 2024-08-01 | Discharge: 2024-08-01 | Payer: MEDICARE

## 2024-08-01 NOTE — Telephone Encounter [36]
 Called pt and completed 1 year post TAVR kccq
